# Patient Record
Sex: Female | Born: 1950 | Race: White | Hispanic: No | Marital: Married | Smoking: Current every day smoker
Health system: Southern US, Community
[De-identification: ages and names within clinical notes are randomized; demographics above are authoritative.]

## PROBLEM LIST (undated history)

## (undated) DIAGNOSIS — I10 Essential (primary) hypertension: Secondary | ICD-10-CM

## (undated) DIAGNOSIS — I639 Cerebral infarction, unspecified: Secondary | ICD-10-CM

## (undated) DIAGNOSIS — E079 Disorder of thyroid, unspecified: Secondary | ICD-10-CM

## (undated) DIAGNOSIS — J439 Emphysema, unspecified: Secondary | ICD-10-CM

## (undated) HISTORY — DX: Disorder of thyroid, unspecified: E07.9

## (undated) HISTORY — PX: SHOULDER SURGERY: SHX246

## (undated) HISTORY — DX: Essential (primary) hypertension: I10

## (undated) HISTORY — DX: Cerebral infarction, unspecified: I63.9

## (undated) HISTORY — DX: Emphysema, unspecified: J43.9

## (undated) HISTORY — PX: ABDOMINAL HYSTERECTOMY: SHX81

## (undated) HISTORY — PX: OTHER SURGICAL HISTORY: SHX169

## (undated) HISTORY — PX: BREAST EXCISIONAL BIOPSY: SUR124

---

## 1998-07-06 ENCOUNTER — Other Ambulatory Visit: Admission: RE | Admit: 1998-07-06 | Discharge: 1998-07-06 | Payer: Self-pay | Admitting: Obstetrics and Gynecology

## 1998-08-05 ENCOUNTER — Inpatient Hospital Stay (HOSPITAL_COMMUNITY): Admission: RE | Admit: 1998-08-05 | Discharge: 1998-08-06 | Payer: Self-pay | Admitting: Obstetrics and Gynecology

## 1998-08-10 ENCOUNTER — Encounter: Payer: Self-pay | Admitting: Obstetrics and Gynecology

## 1998-08-10 ENCOUNTER — Inpatient Hospital Stay (HOSPITAL_COMMUNITY): Admission: EM | Admit: 1998-08-10 | Discharge: 1998-08-18 | Payer: Self-pay | Admitting: Emergency Medicine

## 1998-08-11 ENCOUNTER — Encounter: Payer: Self-pay | Admitting: Obstetrics and Gynecology

## 1998-08-19 ENCOUNTER — Encounter (HOSPITAL_BASED_OUTPATIENT_CLINIC_OR_DEPARTMENT_OTHER): Payer: Self-pay | Admitting: General Surgery

## 1998-08-19 ENCOUNTER — Inpatient Hospital Stay (HOSPITAL_COMMUNITY): Admission: EM | Admit: 1998-08-19 | Discharge: 1998-08-30 | Payer: Self-pay | Admitting: Emergency Medicine

## 1998-08-20 ENCOUNTER — Encounter (HOSPITAL_BASED_OUTPATIENT_CLINIC_OR_DEPARTMENT_OTHER): Payer: Self-pay | Admitting: General Surgery

## 1998-08-21 ENCOUNTER — Encounter (HOSPITAL_BASED_OUTPATIENT_CLINIC_OR_DEPARTMENT_OTHER): Payer: Self-pay | Admitting: General Surgery

## 1998-08-23 ENCOUNTER — Encounter (HOSPITAL_BASED_OUTPATIENT_CLINIC_OR_DEPARTMENT_OTHER): Payer: Self-pay | Admitting: General Surgery

## 1998-08-25 ENCOUNTER — Encounter (HOSPITAL_BASED_OUTPATIENT_CLINIC_OR_DEPARTMENT_OTHER): Payer: Self-pay | Admitting: General Surgery

## 1998-08-26 ENCOUNTER — Encounter (HOSPITAL_BASED_OUTPATIENT_CLINIC_OR_DEPARTMENT_OTHER): Payer: Self-pay | Admitting: General Surgery

## 1998-08-30 ENCOUNTER — Encounter (HOSPITAL_BASED_OUTPATIENT_CLINIC_OR_DEPARTMENT_OTHER): Payer: Self-pay | Admitting: General Surgery

## 1999-10-07 ENCOUNTER — Ambulatory Visit (HOSPITAL_BASED_OUTPATIENT_CLINIC_OR_DEPARTMENT_OTHER): Admission: RE | Admit: 1999-10-07 | Discharge: 1999-10-08 | Payer: Self-pay | Admitting: Orthopedic Surgery

## 2002-08-12 ENCOUNTER — Encounter: Admission: RE | Admit: 2002-08-12 | Discharge: 2002-08-12 | Payer: Self-pay | Admitting: Family Medicine

## 2002-08-12 ENCOUNTER — Encounter: Payer: Self-pay | Admitting: Family Medicine

## 2002-09-25 ENCOUNTER — Encounter (INDEPENDENT_AMBULATORY_CARE_PROVIDER_SITE_OTHER): Payer: Self-pay | Admitting: Specialist

## 2002-09-25 ENCOUNTER — Ambulatory Visit (HOSPITAL_COMMUNITY): Admission: RE | Admit: 2002-09-25 | Discharge: 2002-09-25 | Payer: Self-pay | Admitting: Otolaryngology

## 2002-11-03 ENCOUNTER — Ambulatory Visit: Admission: RE | Admit: 2002-11-03 | Discharge: 2003-02-01 | Payer: Self-pay | Admitting: Radiation Oncology

## 2002-11-05 ENCOUNTER — Encounter: Admission: RE | Admit: 2002-11-05 | Discharge: 2002-11-05 | Payer: Self-pay | Admitting: Dentistry

## 2003-02-16 ENCOUNTER — Encounter: Payer: Self-pay | Admitting: Hematology & Oncology

## 2003-02-16 ENCOUNTER — Ambulatory Visit (HOSPITAL_COMMUNITY): Admission: RE | Admit: 2003-02-16 | Discharge: 2003-02-16 | Payer: Self-pay | Admitting: Hematology & Oncology

## 2003-02-27 ENCOUNTER — Other Ambulatory Visit: Admission: RE | Admit: 2003-02-27 | Discharge: 2003-02-27 | Payer: Self-pay | Admitting: Obstetrics and Gynecology

## 2003-03-09 ENCOUNTER — Ambulatory Visit: Admission: RE | Admit: 2003-03-09 | Discharge: 2003-03-09 | Payer: Self-pay | Admitting: Radiation Oncology

## 2003-04-02 ENCOUNTER — Ambulatory Visit: Admission: RE | Admit: 2003-04-02 | Discharge: 2003-04-02 | Payer: Self-pay | Admitting: Radiation Oncology

## 2003-05-11 ENCOUNTER — Ambulatory Visit (HOSPITAL_COMMUNITY): Admission: RE | Admit: 2003-05-11 | Discharge: 2003-05-11 | Payer: Self-pay | Admitting: Hematology & Oncology

## 2003-06-04 ENCOUNTER — Ambulatory Visit: Admission: RE | Admit: 2003-06-04 | Discharge: 2003-06-04 | Payer: Self-pay | Admitting: Radiation Oncology

## 2003-07-16 ENCOUNTER — Ambulatory Visit: Admission: RE | Admit: 2003-07-16 | Discharge: 2003-07-16 | Payer: Self-pay | Admitting: Radiation Oncology

## 2003-08-05 ENCOUNTER — Ambulatory Visit (HOSPITAL_COMMUNITY): Admission: RE | Admit: 2003-08-05 | Discharge: 2003-08-05 | Payer: Self-pay | Admitting: Hematology & Oncology

## 2003-09-10 ENCOUNTER — Ambulatory Visit: Admission: RE | Admit: 2003-09-10 | Discharge: 2003-09-10 | Payer: Self-pay | Admitting: Radiation Oncology

## 2003-10-19 ENCOUNTER — Ambulatory Visit (HOSPITAL_COMMUNITY): Admission: RE | Admit: 2003-10-19 | Discharge: 2003-10-19 | Payer: Self-pay | Admitting: Hematology & Oncology

## 2003-12-31 ENCOUNTER — Ambulatory Visit: Admission: RE | Admit: 2003-12-31 | Discharge: 2003-12-31 | Payer: Self-pay | Admitting: Radiation Oncology

## 2004-01-14 ENCOUNTER — Ambulatory Visit: Admission: RE | Admit: 2004-01-14 | Discharge: 2004-01-14 | Payer: Self-pay | Admitting: Radiation Oncology

## 2004-01-14 ENCOUNTER — Ambulatory Visit (HOSPITAL_COMMUNITY): Admission: RE | Admit: 2004-01-14 | Discharge: 2004-01-14 | Payer: Self-pay | Admitting: Radiation Oncology

## 2004-01-25 ENCOUNTER — Ambulatory Visit (HOSPITAL_COMMUNITY): Admission: RE | Admit: 2004-01-25 | Discharge: 2004-01-25 | Payer: Self-pay | Admitting: Hematology & Oncology

## 2004-02-03 ENCOUNTER — Ambulatory Visit: Payer: Self-pay | Admitting: Dentistry

## 2004-02-10 ENCOUNTER — Ambulatory Visit: Payer: Self-pay | Admitting: Dentistry

## 2004-02-11 ENCOUNTER — Ambulatory Visit: Admission: RE | Admit: 2004-02-11 | Discharge: 2004-02-11 | Payer: Self-pay | Admitting: Radiation Oncology

## 2004-02-17 ENCOUNTER — Ambulatory Visit: Payer: Self-pay | Admitting: Dentistry

## 2004-03-30 ENCOUNTER — Ambulatory Visit: Payer: Self-pay | Admitting: Dentistry

## 2004-04-08 ENCOUNTER — Ambulatory Visit (HOSPITAL_COMMUNITY): Admission: RE | Admit: 2004-04-08 | Discharge: 2004-04-08 | Payer: Self-pay | Admitting: Hematology & Oncology

## 2004-05-19 ENCOUNTER — Ambulatory Visit: Admission: RE | Admit: 2004-05-19 | Discharge: 2004-05-19 | Payer: Self-pay | Admitting: Radiation Oncology

## 2004-06-02 ENCOUNTER — Ambulatory Visit: Payer: Self-pay | Admitting: Hematology & Oncology

## 2004-07-27 ENCOUNTER — Ambulatory Visit: Payer: Self-pay | Admitting: Dentistry

## 2004-09-19 ENCOUNTER — Ambulatory Visit: Payer: Self-pay | Admitting: Hematology & Oncology

## 2004-09-20 ENCOUNTER — Ambulatory Visit: Payer: Self-pay | Admitting: Dentistry

## 2004-09-22 ENCOUNTER — Ambulatory Visit (HOSPITAL_COMMUNITY): Admission: RE | Admit: 2004-09-22 | Discharge: 2004-09-22 | Payer: Self-pay | Admitting: Hematology & Oncology

## 2005-03-30 ENCOUNTER — Ambulatory Visit: Payer: Self-pay | Admitting: Hematology & Oncology

## 2005-08-05 ENCOUNTER — Ambulatory Visit: Payer: Self-pay | Admitting: *Deleted

## 2005-08-05 ENCOUNTER — Inpatient Hospital Stay (HOSPITAL_COMMUNITY): Admission: EM | Admit: 2005-08-05 | Discharge: 2005-08-08 | Payer: Self-pay | Admitting: Emergency Medicine

## 2005-09-13 ENCOUNTER — Encounter: Payer: Self-pay | Admitting: Emergency Medicine

## 2005-09-13 ENCOUNTER — Encounter (HOSPITAL_BASED_OUTPATIENT_CLINIC_OR_DEPARTMENT_OTHER): Payer: Self-pay | Admitting: General Surgery

## 2006-03-28 ENCOUNTER — Ambulatory Visit: Payer: Self-pay | Admitting: Hematology & Oncology

## 2006-03-30 LAB — CBC WITH DIFFERENTIAL/PLATELET
BASO%: 0.4 % (ref 0.0–2.0)
Basophils Absolute: 0 10*3/uL (ref 0.0–0.1)
EOS%: 3 % (ref 0.0–7.0)
Eosinophils Absolute: 0.2 10*3/uL (ref 0.0–0.5)
HCT: 42.8 % (ref 34.8–46.6)
HGB: 14.9 g/dL (ref 11.6–15.9)
LYMPH%: 22.7 % (ref 14.0–48.0)
MCH: 32.5 pg (ref 26.0–34.0)
MCHC: 34.8 g/dL (ref 32.0–36.0)
MCV: 93.4 fL (ref 81.0–101.0)
MONO#: 0.4 10*3/uL (ref 0.1–0.9)
MONO%: 7.2 % (ref 0.0–13.0)
NEUT#: 4.1 10*3/uL (ref 1.5–6.5)
NEUT%: 66.7 % (ref 39.6–76.8)
Platelets: 212 10*3/uL (ref 145–400)
RBC: 4.58 10*6/uL (ref 3.70–5.32)
RDW: 12.8 % (ref 11.3–14.5)
WBC: 6.1 10*3/uL (ref 3.9–10.0)
lymph#: 1.4 10*3/uL (ref 0.9–3.3)

## 2006-03-30 LAB — COMPREHENSIVE METABOLIC PANEL
ALT: 13 U/L (ref 0–35)
AST: 20 U/L (ref 0–37)
Albumin: 4.1 g/dL (ref 3.5–5.2)
Alkaline Phosphatase: 70 U/L (ref 39–117)
BUN: 16 mg/dL (ref 6–23)
CO2: 30 mEq/L (ref 19–32)
Calcium: 9.7 mg/dL (ref 8.4–10.5)
Chloride: 106 mEq/L (ref 96–112)
Creatinine, Ser: 1.08 mg/dL (ref 0.40–1.20)
Glucose, Bld: 106 mg/dL — ABNORMAL HIGH (ref 70–99)
Potassium: 3.5 mEq/L (ref 3.5–5.3)
Sodium: 142 mEq/L (ref 135–145)
Total Bilirubin: 0.2 mg/dL — ABNORMAL LOW (ref 0.3–1.2)
Total Protein: 6.8 g/dL (ref 6.0–8.3)

## 2006-03-30 LAB — TSH: TSH: 2.946 u[IU]/mL (ref 0.350–5.500)

## 2007-03-27 ENCOUNTER — Ambulatory Visit: Payer: Self-pay | Admitting: Hematology & Oncology

## 2007-03-29 LAB — TSH: TSH: 4.718 u[IU]/mL (ref 0.350–5.500)

## 2007-03-29 LAB — CBC WITH DIFFERENTIAL/PLATELET
Basophils Absolute: 0 10*3/uL (ref 0.0–0.1)
Eosinophils Absolute: 0.2 10*3/uL (ref 0.0–0.5)
HCT: 42.5 % (ref 34.8–46.6)
HGB: 15 g/dL (ref 11.6–15.9)
MCV: 91.9 fL (ref 81.0–101.0)
NEUT#: 3.9 10*3/uL (ref 1.5–6.5)
NEUT%: 67.3 % (ref 39.6–76.8)
RDW: 13.3 % (ref 11.3–14.5)
lymph#: 1.3 10*3/uL (ref 0.9–3.3)

## 2007-03-29 LAB — COMPREHENSIVE METABOLIC PANEL
Albumin: 4.3 g/dL (ref 3.5–5.2)
BUN: 18 mg/dL (ref 6–23)
Calcium: 9.4 mg/dL (ref 8.4–10.5)
Chloride: 105 mEq/L (ref 96–112)
Glucose, Bld: 102 mg/dL — ABNORMAL HIGH (ref 70–99)
Potassium: 3.9 mEq/L (ref 3.5–5.3)

## 2007-12-09 ENCOUNTER — Ambulatory Visit (HOSPITAL_COMMUNITY): Admission: RE | Admit: 2007-12-09 | Discharge: 2007-12-09 | Payer: Self-pay | Admitting: Family Medicine

## 2008-03-25 ENCOUNTER — Ambulatory Visit: Payer: Self-pay | Admitting: Hematology & Oncology

## 2008-08-12 ENCOUNTER — Ambulatory Visit: Payer: Self-pay | Admitting: Hematology & Oncology

## 2008-08-13 LAB — COMPREHENSIVE METABOLIC PANEL
ALT: 13 U/L (ref 0–35)
AST: 21 U/L (ref 0–37)
Albumin: 4.2 g/dL (ref 3.5–5.2)
Alkaline Phosphatase: 70 U/L (ref 39–117)
BUN: 15 mg/dL (ref 6–23)
CO2: 26 mEq/L (ref 19–32)
Calcium: 9.4 mg/dL (ref 8.4–10.5)
Chloride: 103 mEq/L (ref 96–112)
Creatinine, Ser: 0.91 mg/dL (ref 0.40–1.20)
Glucose, Bld: 85 mg/dL (ref 70–99)
Potassium: 4.2 mEq/L (ref 3.5–5.3)
Sodium: 138 mEq/L (ref 135–145)
Total Bilirubin: 0.3 mg/dL (ref 0.3–1.2)
Total Protein: 6.7 g/dL (ref 6.0–8.3)

## 2008-08-13 LAB — CBC WITH DIFFERENTIAL (CANCER CENTER ONLY)
Eosinophils Absolute: 0.2 10*3/uL (ref 0.0–0.5)
LYMPH#: 1.3 10*3/uL (ref 0.9–3.3)
MONO#: 0.3 10*3/uL (ref 0.1–0.9)
MONO%: 6.2 % (ref 0.0–13.0)
NEUT#: 3.1 10*3/uL (ref 1.5–6.5)
Platelets: 195 10*3/uL (ref 145–400)
RBC: 4.69 10*6/uL (ref 3.70–5.32)
WBC: 5.1 10*3/uL (ref 3.9–10.0)

## 2008-08-13 LAB — TSH: TSH: 5.5 u[IU]/mL — ABNORMAL HIGH (ref 0.350–4.500)

## 2010-03-01 ENCOUNTER — Other Ambulatory Visit: Payer: Self-pay | Admitting: Surgery

## 2010-09-07 ENCOUNTER — Other Ambulatory Visit: Payer: Self-pay | Admitting: Dermatology

## 2010-09-30 NOTE — H&P (Signed)
Melody White, GANIM                 ACCOUNT NO.:  [id number]   MEDICAL RECORD NO.:  [id number]          PATIENT TYPE:  INP   LOCATION:  A211                          FACILITY:  [facility]   PHYSICIAN:  Corrie Mckusick, M.D.  DATE OF BIRTH:  Sep 24, 1950   DATE OF ADMISSION:  08/05/2005  DATE OF DISCHARGE:  LH                                HISTORY & PHYSICAL   ADMISSION DIAGNOSIS:  Transient ischemic attack.   HISTORY OF PRESENT ILLNESS:  This is a 60 year old smoker with no  significant past medical history who presents with TIA.  On the day of  admission she presents with right upper arm weakness and numbness and speech  difficulty which lasted about 90 minutes.  It came on while she was at home  and she started off with some speech difficulties and then had right upper  extremity weakness and tingling.  She came into the emergency department. By  the time she got there, she had had resolution of symptoms.  Really by the  time the ER physician evaluated her, she had completely returned to normal.  Her blood pressure was quite stable during the emergency room visit and  vital signs remained stable as well.   She has had a long smoking history but really no other significant past  medical history other than in 2004, she had excision of a right neck mass  which she reports was benign. Review of systems from the cardiovascular and  respiratory standpoint were otherwise negative.   PAST MEDICAL HISTORY:  1.  2001, rotator cuff surgery, left shoulder.  2.  Right neck mass biopsy and subsequent removal.  3.  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   MEDICATIONS ON ADMISSION:  None.   ALLERGIES:  None reported.   SOCIAL HISTORY:  Smoked most of her life. No alcohol use.   FAMILY HISTORY:  Noncontributory.  There is no history of CVAs per the  patient.   PHYSICAL EXAMINATION:  VITAL SIGNS: Temperature 98.6 , blood pressure  140/78, 20, heart rate 60, weight 125 pounds.  GENERAL:  When I saw her, she was pleasant, talkative female, joking, ready,  and wanting to go home.  HEENT:  Nasopharynx clear.  NECK:  Supple. No lymphadenopathy, no thyromegaly.  Carotids: Actually I do  think there is a left bruit on the left side that is difficult to hear, but  I think it is quite faint.  Right is without bruits.  CHEST:  Clear to auscultation bilaterally.  CARDIOVASCULAR:  Regular rate and rhythm.  Normal S1 and S2.  No S3 or S4,  murmurs, gallops or rubs.  ABDOMEN:  Soft, nontender, nondistended.  EXTREMITIES:  No cyanosis, clubbing or edema.  NEUROLOGIC:  Pupils are equal and react to light.  Extraocular muscles  intact.  Cranial nerves II-XII intact.  Strength is 5/5 throughout.  Sensation is intact throughout.  Completely nonfocal neurological exam.   LABORATORY DATA:  Please see flow sheet for details.  CT scan of the head is  reportedly negative per the emergency room physician.  ASSESSMENT/PLAN:  A 60 year old female with longstanding smoking history who  presents with transient ischemic attack.   PLAN:  1.  Admit to 2A for close monitoring and telemetry.  2.  Go ahead and cover with full dose Lovenox 1 mg/kg q.12h.  3.  Aspirin 325 mg coated daily.  4.  Carotid Dopplers.  5.  Echocardiogram.  6.  MRI/MRA.  7.  Neurology consult.      Corrie Mckusick, M.D.  Electronically Signed     JCG/MEDQ  D:  08/06/2005  T:  08/08/2005  Job:  [id number]

## 2010-09-30 NOTE — Op Note (Signed)
NAME:  Melody White, Melody White                           ACCOUNT NO.:  [id number]   MEDICAL RECORD NO.:  [id number]                   PATIENT TYPE:  OIB   LOCATION:  NA                                   FACILITY:  [facility]   PHYSICIAN:  Kinnie Scales. Annalee Genta, M.D.            DATE OF BIRTH:  1950/08/15   DATE OF PROCEDURE:  09/25/2002  DATE OF DISCHARGE:                                 OPERATIVE REPORT   PREOPERATIVE DIAGNOSIS:  Right superior neck mass.   POSTOPERATIVE DIAGNOSIS:  Right superior neck mass.   OPERATION PERFORMED:  1. Direct laryngoscopy and cervical esophagoscopy with biopsy of the right     base of tongue.  2. Excisional biopsy, right deep superior neck mass.   SURGEON:  Kinnie Scales. Annalee Genta, M.D.   ANESTHESIA:  General endotracheal.   COMPLICATIONS:  None.   ESTIMATED BLOOD LOSS:  Minimal.   DISPOSITION:  Patient transferred from the operating room to the recovery  room in stable condition.   INDICATIONS FOR PROCEDURE:  The patient is a 60 year old white female who  was referred for evaluation of a gradually enlarging mass in the right  superior neck.  The patient had a one pack per day smoking history, is  otherwise healthy and had no associated problems or complaints, no pain,  erythema or evidence of infection.  She had been treated with several  courses of antibiotics without improvement.  Examination in the office  revealed an approximately 2 cm firm mobile mass in the superior aspect of  the right neck anterior and deep to the sternocleidomastoid muscle.  A CT  scan was obtained which showed a well circumscribed mass in the anterior  superior neck consistent with lymph node.  There were several other small,  nonpathologically sized lymph nodes in the same region.  Given the patient's  history and examination, I recommended that we undertake direct laryngoscopy  and cervical esophagoscopy for evaluation of the entire upper aerodigestive  tract and incision biopsy of  the right deep neck mass.  The risks, benefits,  and possible complications of each of the surgical procedures was discussed  in detail with the patient and her family who  understood and concurred with  our plan for surgery which was scheduled for Sep 25, 2002.   DESCRIPTION OF PROCEDURE:  The patient was brought to the operating room on  Sep 25, 2002.  She was placed in supine position on the operating table and  general endotracheal anesthesia was established without difficulty.  When  the patient was adequately anesthetized, direct laryngoscopy and cervical  esophagoscopy was undertaken.  The patient's oral cavity, oropharynx, larynx  and hypopharynx were thoroughly examined.  The patient had 1+ tonsils.  No  mucosal ulceration, mass or lesion.  The base of tongue appeared normal  The  supraglottis, vallecula, hypopharynx and larynx all appeared normal without  evidence of mass or tumor.  Several blind biopsies were taken of the right  inferior tonsil/base of tongue and these were sent to pathology for  microscopic evaluation.  The remainder of the patient's upper aerodigestive  tract appeared normal without mucosal lesion or ulceration.  A 30 cm  cervical esophagoscope was then inserted into the esophageal introitus  without difficulty and then inserted full length to 30 cm and then gradually  withdrawn.  There was no evidence of mucosal mass or ulceration within the  patient's upper esophagus.  With conclusion of this portion of the  procedure, excisional biopsy of the mass was undertaken.   The patient's neck was injected with 2mL of 1% lidocaine 1:100,000 dilution  of epinephrine in a subcutaneous fashion in the skin overlying the mass.  The patient's neck was then prepped and draped in sterile fashion.  The  surgical procedure was begun by creating a 3 cm horizontally oriented  incision in a pre-existing skin crease.  This was carried through the  cutaneous layer and underlying  deep subcutaneous fat to the level of the  platysma muscle.  The platysma muscle was divided and elevated.  Subplatysmal dissection was carried out above and below the level of the  right neck mass.  The medial  aspect of the sternocleidomastoid muscle was  identified and retracted laterally and this allowed direct access to the  deep compartment of the neck.  A well-circumscribed approximately 1 x 2 cm  firm mass was then isolated from the surrounding tissues using Bovie  electrocautery and bipolar cautery as well as suture ligature.  Small blood  vessels were divided and ligated and the mass was excised from the deep  compartment of the neck.  Jugular vein, spinal accessory nerve and carotid  artery were identified and preserved throughout their course.  With the mass  removed, it was sent to pathology for gross and microscopic evaluation as  well as frozen section evaluation.  The patient's neck was then thoroughly  irrigated with saline solution.  Several areas of point hemorrhage were  cauterized with bipolar cautery and the wound was closed in multiple layers  consisting of reapproximation of the platysma muscle with 4-0 Vicryl suture,  reapproximation of the deep subcutaneous tissue with 4-0 Vicryl in an  interrupted fashion and final skin closure consisting of 5-0 Ethilon suture  in a running locked fashion.  The patient's wound was then cleansed and  dressed with bacitracin ointment.  She was awakened from anesthetic.  She  was extubated and was transferred from the operating room to the recovery  room in stable condition.  There were no complications.  The estimated blood  loss was minimal.                                               Onalee Hua L. Annalee Genta, M.D.    DLS/MEDQ  D:  16/02/9603  T:  09/26/2002  Job:  [id number]

## 2010-09-30 NOTE — Procedures (Signed)
Melody White, Melody White NO.:  [id number]   MEDICAL RECORD NO.:  [id number]          PATIENT TYPE:  INP   LOCATION:  A211                          FACILITY:  [facility]   PHYSICIAN:  Vida Roller, M.D.   DATE OF BIRTH:  Jul 16, 1950   DATE OF PROCEDURE:  08/07/2005  DATE OF DISCHARGE:                                  ECHOCARDIOGRAM   TAPE NUMBER:  [id number].   TAPE COUNT:  1005 through 1517.   This is a 60 year old woman with a TIA.  Technical quality of this study is  adequate.   M-MODE TRACING:  The aorta is 29 mm.   Left atrium is 30 mm.   Septum is 9 mm.   Posterior wall is 9 mm   Left ventricular diastolic dimension 45 mm.   Left ventricular systolic dimension 32 mm.   2-D AND DOPPLER IMAGING:  The left ventricular is normal size with normal  systolic function.  Estimated ejection fraction 55-60%.  There are no wall  motion abnormalities seen.   The right ventricle is normal size with normal systolic function.   Both atria appear normal.   The aortic valve is without stenosis or regurgitation.   The mitral valve has trivial regurgitation.  No stenosis is seen.   The tricuspid valve is without significant stenosis or regurgitation.   There is no pericardial effusion.   The ascending aorta was not well seen.   ASSESSMENT:  1.  No evidence for source of emboli.  2.  This study is likely inadequate to completely exclude a source of      emboli.  If clinical correlation is appropriate, transesophageal      echocardiogram is recommended.      Vida Roller, M.D.  Electronically Signed     JH/MEDQ  D:  08/07/2005  T:  08/08/2005  Job:  [id number]   cc:   Corrie Mckusick, M.D.  Fax: [phone]

## 2010-09-30 NOTE — Consult Note (Signed)
NAMEHOANG, REICH                 ACCOUNT NO.:  [id number]   MEDICAL RECORD NO.:  [id number]          PATIENT TYPE:  INP   LOCATION:  A211                          FACILITY:  [facility]   PHYSICIAN:  Kofi A. Gerilyn Pilgrim, M.D. DATE OF BIRTH:  [birth year]   DATE OF CONSULTATION:  DATE OF DISCHARGE:                                   CONSULTATION   NEUROLOGICAL CONSULTATION   REASON FOR CONSULTATION:  Transient ischemic attack.   HISTORY OF PRESENT ILLNESS:  60 year old white female who developed the  acute onset of tunnel vision with grain and blackening of her vision  bilateral.  This was associated with numbness of the right upper extremity,  followed by similar symptoms involving the right leg.  She also had some  weakness on the right side. However, she developed similar symptoms  involving the left side.  The event lasted for several hours.  On coming to  the emergency room she had improvement of her visual problems with some  residual problems involving the extremities.  She denies diplopia,  dysarthria, or dysphagia.  The patient endorsed having headaches but  apparently this is a chronic thing. She apparently has frequent daily  headaches in bifrontal regions, not associated with any symptoms including  not associated with headache with nausea.  She does have less frequent  severe headaches associated with nausea which she described as migrainous.   PAST MEDICAL HISTORY:  Significant for some type of neck cancer where she  had excision.  The patient is also status post hysterectomy.   ADMISSION MEDICATIONS:  Over the counter [facility] Goody Powders for headaches.   REVIEW OF SYSTEMS:  Essentially negative apart from history of present  illness.   SOCIAL HISTORY:  She is married and smokes about one pack of cigarettes per  day. No alcohol or illicit drug use.   PHYSICAL EXAMINATION:  GENERAL APPEARANCE:  Examination shows an average-  weight lady in no acute distress.  VITAL SIGNS:   Temperature 97.6, pulse 67, respirations 20, blood pressure  123/76.  HEENT:  Evaluation shows neck is supple.  Head is normocephalic, atraumatic.  ABDOMEN:  Soft.  EXTREMITIES: No cyanosis, varicosities or edema.  MENTATION:  She is awake and alert.  She converses well.  She is lucid and  coherent. Speech, language and cognition are intact.  CRANIAL NERVES II-XII:  Evaluation shows pupils are 4 mm and briskly  reactive. Extraocular movements intact. Visual fields are full.  Facial  muscle strength is normal and symmetric.  Tongue is midline. Uvula is  midline.  Shoulder shrugs normal.  Motor examination shows normal tone, bulk  and strength.  There is no drift.  Coordination shows no evidence of  dysmetria.  There is no tremor noted, bradykinesia. Reflexes are normal and  symmetric.  Plantar reflexes are both downgoing.  Gait is normal.   CLINICAL DATA:  Head CT scan is negative.   LABORATORY DATA:  White blood cell count 7.0, hemoglobin 15, platelet count  200,000.  Sodium 139, potassium 3.7, chloride 106, cO2 27, glucose 95, BUN  13, creatinine 1.1,  calcium 9.8.  CPK 98. Alcohol less than 5.   ASSESSMENT:  Ill-defined symptomatology, does not seem to localize clearly  to any defined anatomical location.  Even the bilateral symptoms one wonders  about vertebrobasilar disease, although I think this is unlikely given her  relative lack of risk factors.  One wonders about the functional  problem  versus an anatomical problem.   RECOMMENDATIONS:  Agree to MRI and MRA.  I am going to add contrast to the  MRI of the brain.  Additional evaluation should include carotid Doppler's,  vitamin B12 level and thyroid function testing.      Kofi A. Gerilyn Pilgrim, M.D.  Electronically Signed     KAD/MEDQ  D:  08/07/2005  T:  08/07/2005  Job:  [id number]

## 2010-09-30 NOTE — Op Note (Signed)
[facility]  Patient:    Melody White, Melody White                   MRN: [id number] Proc. Date: 10/07/99 Adm. Date:  [id number] Disc. 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  POSTOPERATIVE DIAGNOSES: 1. Full thickness rotator cuff tear. 2. Type 3 acromion with impingement. 3. Anterior labral tear.  PRINCIPAL PROCEDURE: 1. Mini open rotator cuff repair with three suture anchors. 2. Arthroscopic acromioplasty with debridement of full thickness rotator cuff    and anterior labral tear.  SURGEON:  Harvie Junior, M.D.  ASSISTANT:  Kerby Less, P.A.  ANESTHESIA:  General with block.  BRIEF HISTORY:  The patient is a 60 year old female with a long history of having had left shoulder pain.  She did not improve with injections completely, but did have complete 100% relief in the office with an injection. She failed conservative care and ultimately is brought to the operating room for debridement of type 3 acromion.  Thoughts have been given preoperative to doing a capsular shrinkage because she did have some quite significant multi-directional instability.  She was brought to the operating room for this evaluation under anesthesia and arthroscopy.  DESCRIPTION OF PROCEDURE:  The patient was brought to the operating room and after adequate anesthesia was obtained with a general anesthetic, the patient was placed supine on the operating table.  She was then moved into the beach chair position.  From the beach chair position, she was examined under anesthesia and noted to have obvious anterior laxity and inferior laxity, essentially more anterior than anything else.  The head did not really dislocate with extreme external rotation, but did kind of move anteriorly. The significance of this is unclear.  She did not  frankly dislocate coming anteriorly.  At this point, she was prepped and draped in the usual sterile fashion, and routine arthroscopy revealed that there was some inferior and anterior glenohumeral laxity.  There was no significant glenohumeral arthritis.  There was an obvious full thickness rotator cuff tear in the area of the supraspinatus with quite significant fray posterior to the full thickness tear.  At this point, attention was turned into the subacromial space.  Thoughts about doing a capsular shrinkage were put on hold, given that we were going to have to do a mini open full thickness rotator cuff repair, and the postoperative recovery would be significantly different with the rotator cuff repair than with the capsular shrinkage, and given that she had a full thickness tear, and the major complaint was pain, it is felt that addressing this would most likely address the predominant issue of pain, and the thoughts were given that we could come back and do a shrinkage if we needed to, but this was the thought process that we undertook at the time of the case.  At this point, a motorized bur was introduced in the subacromial space and an anterolateral acromioplasty was performed.  Following this, attention was turned to the full thickness rotator cuff tear and the scope was removed after the edges were debrided and the full thickness area identified.  At that point, a mini open incision was made.  The subcutaneous tissue was taken down to the level of  the deltoid which was identified and divided in line with its fibers, in line with the portal.  We dissected at that point up on top of the acromion and the flaps were raised of the deltoid with care being taken to maintain integrity of the deltoid to bone.  At this point, the shoulder was opened.  The acromioplasty was evaluated and felt to be perfectly done.  At that point, attention was turned to the bursa where a full  bursectomy was performed.  The full thickness defect could be easily identified at this point.  There was delamination underneath and the cuff was elevated anterior and posterior to this full thickness area to allow access to this high grade partial thickness tearing.  At this point, three suture anchors were used to hold the cuff in place, and then a side-to-side repair was made through the posterior aspect of the rotator cuff.  At this point, the wound was copiously irrigated and suctioned dry.  All the repair was done with suture anchors of #2 Ethibond.  The deltoid was then closed with #1 Vicryl running, the subcutaneous with 2-0 Vicryl running, and the skin with 3-0 Prolene pull out.  Steri-Strips were applied to the front portals and the side wound, and the patient was then placed into a compressive dressing and shoulder immobilizer. She was taken to the recovery room where she was noted to be in satisfactory condition.  Estimated blood loss for this procedure was none. DD:  10/07/99 TD:  10/11/99 Job: [id number]

## 2010-09-30 NOTE — Discharge Summary (Signed)
NAMENICCI, VAUGHAN                 ACCOUNT NO.:  [id number]   MEDICAL RECORD NO.:  [id number]          PATIENT TYPE:  INP   LOCATION:  A211                          FACILITY:  [facility]   PHYSICIAN:  Corrie Mckusick, M.D.  DATE OF BIRTH:  07-23-1950   DATE OF ADMISSION:  08/05/2005  DATE OF DISCHARGE:  03/27/2007LH                                 DISCHARGE SUMMARY   DISCHARGE DIAGNOSIS:  Transient ischemic attack.   HISTORY OF PRESENTING ILLNESS AND PAST MEDICAL HISTORY:  Please see  admission H&P.   HOSPITAL COURSE:  A 60 year old female with longstanding smoking history who  presents with TIA.  She was admitted to 2A for close monitoring and had no  further symptoms whatsoever.  Neurologically completely intact.  She was  started on aspirin 325 daily and covered with Lovenox during the hospital  stay.   Neurology was consulted and saw the patient and agreed with the use of  aspirin.  MRI/MRA was overall normal per Dr. Gerilyn Pilgrim.  It will be read by  the radiologist as well.  Echocardiogram is still pending at this time and  we will check this prior to her discharge.  Her bilateral carotid Dopplers  were essentially normal as well.  All blood work came back normal.   The patient was ready for discharge on August 08, 2005 and felt again  remarkably well and back to normal.   DISCHARGE PHYSICAL:  VITAL SIGNS:  Temperature 97.6, blood pressure  120s/70s, heart rate 60s, respirations 20.  GENERAL:  Pleasant female in no acute distress.  RESPIRATORY:  Chest clear to auscultation bilaterally.  CARDIOVASCULAR:  Regular rate and rhythm.  Normal S1 and S2.  No murmurs,  gallops, or rubs.  ABDOMEN:  Soft and nontender.  NEUROLOGICAL:  Cranial nerves II-XII are intact.  Strength is 5/5  throughout.  Sensation is intact.  It is completely nonfocal exam.   DISCHARGE MEDICATIONS:  1.  Aspirin 325 daily.  2.  Chantix as prescribed for smoking cessation.  3.  She is going to also set up followup  with Dr. Gerilyn Pilgrim in 1-2 weeks      after discharge and she is going to follow up with me in the office in 1-      2 weeks after discharge as well.   DISCHARGE CONDITION:  Improved and stable.      Corrie Mckusick, M.D.  Electronically Signed     JCG/MEDQ  D:  08/08/2005  T:  08/09/2005  Job:  [id number]

## 2010-12-21 ENCOUNTER — Other Ambulatory Visit: Payer: Self-pay | Admitting: Obstetrics and Gynecology

## 2012-06-17 ENCOUNTER — Other Ambulatory Visit: Payer: Self-pay | Admitting: Surgery

## 2013-01-06 ENCOUNTER — Other Ambulatory Visit: Payer: Self-pay | Admitting: Physician Assistant

## 2013-03-21 ENCOUNTER — Other Ambulatory Visit: Payer: Self-pay | Admitting: Obstetrics and Gynecology

## 2014-03-27 ENCOUNTER — Other Ambulatory Visit: Payer: Self-pay | Admitting: Obstetrics and Gynecology

## 2015-11-09 ENCOUNTER — Other Ambulatory Visit: Payer: Self-pay | Admitting: Family Medicine

## 2015-11-09 DIAGNOSIS — R590 Localized enlarged lymph nodes: Secondary | ICD-10-CM

## 2015-11-15 ENCOUNTER — Ambulatory Visit
Admission: RE | Admit: 2015-11-15 | Discharge: 2015-11-15 | Disposition: A | Discharge status: Home / Self Care | Payer: BLUE CROSS/BLUE SHIELD | Source: Ambulatory Visit | Attending: Family Medicine | Admitting: Family Medicine

## 2015-11-15 DIAGNOSIS — R590 Localized enlarged lymph nodes: Secondary | ICD-10-CM

## 2015-12-03 ENCOUNTER — Ambulatory Visit (HOSPITAL_COMMUNITY)
Admission: RE | Admit: 2015-12-03 | Discharge: 2015-12-03 | Disposition: A | Discharge status: Home / Self Care | Payer: BLUE CROSS/BLUE SHIELD | Source: Ambulatory Visit | Attending: Vascular Surgery | Admitting: Vascular Surgery

## 2015-12-03 ENCOUNTER — Other Ambulatory Visit (HOSPITAL_COMMUNITY): Payer: Self-pay | Admitting: Family Medicine

## 2015-12-03 DIAGNOSIS — I6523 Occlusion and stenosis of bilateral carotid arteries: Secondary | ICD-10-CM | POA: Diagnosis not present

## 2015-12-03 DIAGNOSIS — I6529 Occlusion and stenosis of unspecified carotid artery: Secondary | ICD-10-CM | POA: Diagnosis not present

## 2015-12-03 LAB — VAS US CAROTID
LEFT ECA DIAS: -14 cm/s
LICADDIAS: -23 cm/s
LICAPDIAS: -28 cm/s
Left CCA dist dias: 28 cm/s
Left CCA dist sys: 94 cm/s
Left CCA prox dias: 41 cm/s
Left CCA prox sys: 101 cm/s
Left ICA dist sys: -75 cm/s
Left ICA prox sys: -80 cm/s
RIGHT CCA MID DIAS: 28 cm/s
RIGHT ECA DIAS: 11 cm/s
Right CCA prox dias: 29 cm/s
Right CCA prox sys: 104 cm/s
Right cca dist sys: -96 cm/s

## 2016-11-06 ENCOUNTER — Other Ambulatory Visit: Payer: Self-pay | Admitting: Family Medicine

## 2016-11-06 DIAGNOSIS — Z1231 Encounter for screening mammogram for malignant neoplasm of breast: Secondary | ICD-10-CM

## 2016-11-28 ENCOUNTER — Ambulatory Visit
Admission: RE | Admit: 2016-11-28 | Discharge: 2016-11-28 | Disposition: A | Discharge status: Home / Self Care | Payer: BLUE CROSS/BLUE SHIELD | Source: Ambulatory Visit | Attending: Family Medicine | Admitting: Family Medicine

## 2016-11-28 ENCOUNTER — Ambulatory Visit: Payer: BLUE CROSS/BLUE SHIELD

## 2016-11-28 DIAGNOSIS — Z1231 Encounter for screening mammogram for malignant neoplasm of breast: Secondary | ICD-10-CM

## 2017-12-04 ENCOUNTER — Other Ambulatory Visit: Payer: Self-pay | Admitting: Family Medicine

## 2017-12-04 DIAGNOSIS — Z1231 Encounter for screening mammogram for malignant neoplasm of breast: Secondary | ICD-10-CM

## 2017-12-24 ENCOUNTER — Ambulatory Visit: Payer: BLUE CROSS/BLUE SHIELD

## 2018-02-14 ENCOUNTER — Ambulatory Visit: Payer: Self-pay

## 2019-01-06 ENCOUNTER — Other Ambulatory Visit: Payer: Self-pay | Admitting: Family Medicine

## 2019-01-06 DIAGNOSIS — Z1231 Encounter for screening mammogram for malignant neoplasm of breast: Secondary | ICD-10-CM

## 2019-01-10 ENCOUNTER — Other Ambulatory Visit: Payer: Self-pay | Admitting: Family Medicine

## 2019-01-10 ENCOUNTER — Ambulatory Visit
Admission: RE | Admit: 2019-01-10 | Discharge: 2019-01-10 | Disposition: A | Discharge status: Home / Self Care | Payer: Medicare Other | Source: Ambulatory Visit | Attending: Family Medicine | Admitting: Family Medicine

## 2019-01-10 DIAGNOSIS — F17200 Nicotine dependence, unspecified, uncomplicated: Secondary | ICD-10-CM

## 2019-01-10 DIAGNOSIS — R7981 Abnormal blood-gas level: Secondary | ICD-10-CM

## 2019-01-17 ENCOUNTER — Telehealth: Payer: Self-pay

## 2019-01-17 NOTE — Telephone Encounter (Signed)
NOTES ON FILE FROM [facility] [phone], SENT REFERRAL TO SCHEDULING °

## 2019-02-21 ENCOUNTER — Other Ambulatory Visit: Payer: Self-pay

## 2019-02-21 ENCOUNTER — Ambulatory Visit
Admission: RE | Admit: 2019-02-21 | Discharge: 2019-02-21 | Disposition: A | Discharge status: Home / Self Care | Payer: Medicare Other | Source: Ambulatory Visit | Attending: Family Medicine | Admitting: Family Medicine

## 2019-02-21 DIAGNOSIS — Z1231 Encounter for screening mammogram for malignant neoplasm of breast: Secondary | ICD-10-CM

## 2019-03-05 NOTE — Progress Notes (Signed)
Cardiology Office Note:   Date:  03/06/2019  NAME:  Melody White    MRN: [id number] DOB:  [birth year]   PCP:  Aletha Halim., PA-C  Cardiologist:  Evalina Field, MD  Electrophysiologist:  None   Referring MD: Aletha Halim., PA-C   Chief Complaint  Patient presents with  . Abnormal ECG   History of Present Illness:   Melody White is a 68 y.o. female with a hx of hypothyroidism, anxiety, hypertension, tobacco abuse who is being seen today for the evaluation of aortic calcifications at the request of Aletha Halim., PA-C.  She recently had an x-ray of her chest performed by her primary care physician and was noted to have aortic calcifications.  She was then sent for further evaluation.  [name] reports no prior history of myocardial infarction or heart failure.  She describes no symptoms of chest pain or trouble breathing.  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  She is also been told her heart rate is little low, but she has no episodes of lightheadedness or dizziness with activity.  She does report bruising as she is been on aspirin 325 mg since a TIA a number of years ago.  She also has a lower extremity wound over the shin of the left leg.  Pulses on that left leg are also diminished.  Pulses in the right leg are good 2+.  She reports no symptoms of claudication that I can tell but does have evidence of likely PAD on exam.  Her most recent lipid profile shows an LDL of 101 HDL 61.  EKG demonstrates an incomplete right bundle branch block with left axis deviation, and she is in sinus bradycardia without any evidence of acute ischemia or prior infarction.  She is a smoker currently smokes half pack and has done so for 50 years.  She is trying to quit.  There is a imaging diagnosis of COPD, however she is not on any inhalers.  Past Medical History: Past Medical History:  Diagnosis Date  . Hypertension   . Stroke  ([place])   . Thyroid disease     Past Surgical History: Past Surgical History:  Procedure Laterality Date  . ABDOMINAL HYSTERECTOMY    . bowel obstruction    . BREAST EXCISIONAL BIOPSY Right 20+ yrs ago   benign  . SHOULDER SURGERY      Current Medications: Current Meds  Medication Sig  . amitriptyline (ELAVIL) 25 MG tablet Take 1 tablet by mouth at bedtime.  . Ascorbic Acid (VITAMIN C) 1000 MG tablet Take 1,000 mg by mouth daily.  . diazepam (VALIUM) 10 MG tablet 1 tablet. Into the vagina every night as needed for pelvic floor spasms  . Echinacea 400 MG CAPS Take 400 mg by mouth daily.  Marland Kitchen escitalopram (LEXAPRO) 10 MG tablet Take 1 tablet by mouth daily.  . fluticasone (FLONASE) 50 MCG/ACT nasal spray Place 2 sprays into the nose as needed.  Marland Kitchen levocetirizine (XYZAL) 5 MG tablet Take 1 tablet by mouth as needed.  Marland Kitchen levothyroxine (SYNTHROID) 25 MCG tablet Take 1 tablet by mouth daily.  Marland Kitchen lisinopril (ZESTRIL) 10 MG tablet Take 1 tablet by mouth daily.  . Multiple Vitamin (MULTIVITAMIN) capsule Take 1 capsule by mouth daily.     Allergies:    Amoxicillin, Azithromycin, Clindamycin, Codeine, Fexofenadine, Hydrocodone-acetaminophen, Hydrocodone-homatropine, and Sulfamethoxazole   Social History: Social History   Socioeconomic History  .  Marital status: Married    Spouse name: Not on file  . Number of children: 0  . Years of education: Not on file  . Highest education level: Not on file  Occupational History  . Occupation: retired   Engineer, production  . Financial resource strain: Not on file  . Food insecurity    Worry: Not on file    Inability: Not on file  . Transportation needs    Medical: Not on file    Non-medical: Not on file  Tobacco Use  . Smoking status: Current Every Day Smoker    Packs/day: 0.50    Years: 50.00    Pack years: 25.00    Types: Cigarettes  . Smokeless tobacco: Never Used  Substance and Sexual Activity  . Alcohol use: Not Currently  . Drug use: Not  on file  . Sexual activity: Not on file  Lifestyle  . Physical activity    Days per week: Not on file    Minutes per session: Not on file  . Stress: Not on file  Relationships  . Social Musician on phone: Not on file    Gets together: Not on file    Attends religious service: Not on file    Active member of club or organization: Not on file    Attends meetings of clubs or organizations: Not on file    Relationship status: Not on file  Other Topics Concern  . Not on file  Social History Narrative  . Not on file     Family History: The patient's family history includes Arthritis in her mother.  ROS:   All other ROS reviewed and negative. Pertinent positives noted in the HPI.     EKGs/Labs/Other Studies Reviewed:   The following studies were personally reviewed by me today:  EKG:  EKG is ordered today.  The ekg ordered today demonstrates sinus bradycardia, heart rate 56, incomplete right bundle branch block with left axis deviation, no acute ischemic ST-T changes, no evidence of prior infarction, and was personally reviewed by me.   CXR 01/10/2019 FINDINGS: Lungs are somewhat hyperexpanded. There is scarring in the right base and to a lesser extent in each upper lobe. There is no edema or consolidation. Heart size and pulmonary vascularity are normal. No adenopathy. There is aortic atherosclerosis. There is mild degenerative change in the thoracic spine.  IMPRESSION: Lungs hyperexpanded with scattered areas of scarring. No edema or consolidation. Heart size normal. Aortic Atherosclerosis (ICD10-I70.0  Recent Labs: No results found for requested labs within last 8760 hours.   Recent Lipid Panel No results found for: CHOL, TRIG, HDL, CHOLHDL, VLDL, LDLCALC, LDLDIRECT  Physical Exam:   VS:  BP 108/70   Pulse (!) 56   Ht  (1.676 m)   Wt 116 lb 9.6 oz (52.9 kg)   BMI 18.82 kg/m    Wt Readings from Last 3 Encounters:  03/06/19 116 lb 9.6 oz (52.9 kg)     General: Well nourished, well developed, in no acute distress Heart: Atraumatic, normal size  Eyes: PEERLA, EOMI  Neck: Supple, no JVD Endocrine: No thryomegaly Cardiac: Normal S1, S2; RRR; no murmurs, rubs, or gallops Lungs: Clear to auscultation bilaterally, no wheezing, rhonchi or rales  Abd: Soft, nontender, no hepatomegaly  Ext: No edema, there is a nonhealing superficial wound on the left shin, pulses are diminished on the left lower extremities, 2+ pulses on the right Musculoskeletal: No deformities, BUE and BLE strength normal and  equal Skin: Warm and dry, no rashes   Neuro: Alert and oriented to person, place, time, and situation, CNII-XII grossly intact, no focal deficits  Psych: Normal mood and affect   ASSESSMENT:   Melody White is a 68 y.o. female who presents for the following: 1. Aortic calcification (HCC)   2. Bradycardia   3. Essential hypertension   4. Tobacco abuse   5. Wound of left lower extremity, initial encounter     PLAN:   1. Aortic calcification (HCC) -I suspect she just has calcifications due to a long history of smoking.  I reviewed the x-rays and the aortic knob on the plain film definitely has evidence of atherosclerosis of the aorta.  She has no symptoms to suggest he has underlying CAD.   -I think we will reduce her aspirin 81 mg and start her on Crestor 10 mg daily.  There is no long-term benefit of aspirin 325.  We will aim to get her cholesterol less than 70.  I will see her back in 3 months she will be fasting we will recheck her lipid profile then. -There is no need for cardiac stress test at this point  2. Bradycardia -Sinus bradycardia today with heart rate 56.  She describes no symptoms of chronotropic incompetence and is able to do exercise without limitations.  I see no need for a monitor at this point we will just watch this.  3. Essential hypertension -Blood pressure is well controlled on lisinopril continue this  4. Tobacco abuse  -50-pack-year history advised to quit smoking  5. Wound of left lower extremity, initial encounter -She has evidence of a poorly healing wound of the left lower extremity as well as diminished pulses in the left leg.  Given that she is had such an extensive smoking history I think we should proceed with a screening ABI as well as arterial duplexes. -No classic symptoms of claudication but I think we should proceed to evaluate this given the wound that is not healing well   Disposition: Return in about 3 months (around 06/06/2019).  Medication Adjustments/Labs and Tests Ordered: Current medicines are reviewed at length with the patient today.  Concerns regarding medicines are outlined above.  Orders Placed This Encounter  Procedures  . VAS [place] ABI WITH/WO TBI  . VAS [place] LOWER EXTREMITY ARTERIAL DUPLEX   Meds ordered this encounter  Medications  . aspirin EC 81 MG tablet    Sig: Take 1 tablet (81 mg total) by mouth daily.    Dispense:  90 tablet    Refill:  3  . rosuvastatin (CRESTOR) 10 MG tablet    Sig: Take 1 tablet (10 mg total) by mouth daily.    Dispense:  90 tablet    Refill:  3    Patient Instructions  Medication Instructions:  START ASPIRIN 81 MG ONCE DAILY  START ROSUVASTATIN 10 MG ONCE DAILY  *If you need a refill on your cardiac medications before your next appointment, please call your pharmacy*  Lab Work: If you have labs (blood work) drawn today and your tests are completely normal, you will receive your results only by: Marland Kitchen MyChart Message (if you have MyChart) OR . A paper copy in the mail If you have any lab test that is abnormal or we need to change your treatment, we will call you to review the results.  Testing/Procedures: Your physician has requested that you have a lower extremity arterial duplex. During this test, ultrasound are used to  evaluate arterial blood flow in the legs. Allow one hour for this exam. There are no restrictions or special  instructions. [facility]  Follow-Up: At [facility], you and your health needs are our priority.  As part of our continuing mission to provide you with exceptional heart care, we have created designated Provider Care Teams.  These Care Teams include your primary Cardiologist (physician) and Advanced Practice Providers (APPs -  Physician Assistants and Nurse Practitioners) who all work together to provide you with the care you need, when you need it.  Your next appointment:   3 months FASTING  The format for your next appointment:   In Person  Provider:   Lennie OdorWesley O'Neal, MD      Signed, Lenna GilfordWesley T. Flora Lipps'Neal, MD [facility]  [facility]  [address] [place], [place] [address] [phone]  03/06/2019 11:20 AM

## 2019-03-06 ENCOUNTER — Encounter: Payer: Self-pay | Admitting: Cardiovascular Disease

## 2019-03-06 ENCOUNTER — Other Ambulatory Visit: Payer: Self-pay

## 2019-03-06 ENCOUNTER — Ambulatory Visit (INDEPENDENT_AMBULATORY_CARE_PROVIDER_SITE_OTHER): Payer: Medicare Other | Admitting: Cardiovascular Disease

## 2019-03-06 ENCOUNTER — Ambulatory Visit (HOSPITAL_COMMUNITY)
Admission: RE | Admit: 2019-03-06 | Discharge: 2019-03-06 | Disposition: A | Discharge status: Home / Self Care | Payer: Medicare Other | Source: Ambulatory Visit | Attending: Cardiology | Admitting: Cardiology

## 2019-03-06 ENCOUNTER — Other Ambulatory Visit: Payer: Self-pay | Admitting: Cardiovascular Disease

## 2019-03-06 VITALS — BP 108/70 | HR 56 | Ht 66.0 in | Wt 116.6 lb

## 2019-03-06 DIAGNOSIS — S81802A Unspecified open wound, left lower leg, initial encounter: Secondary | ICD-10-CM | POA: Diagnosis present

## 2019-03-06 DIAGNOSIS — I10 Essential (primary) hypertension: Secondary | ICD-10-CM

## 2019-03-06 DIAGNOSIS — Z720 Tobacco use: Secondary | ICD-10-CM | POA: Diagnosis not present

## 2019-03-06 DIAGNOSIS — I700 Atherosclerosis of aorta: Secondary | ICD-10-CM

## 2019-03-06 DIAGNOSIS — R001 Bradycardia, unspecified: Secondary | ICD-10-CM

## 2019-03-06 MED ORDER — ASPIRIN EC 81 MG PO TBEC: 81.0000 mg | DELAYED_RELEASE_TABLET | Freq: Every day | ORAL | 3 refills | Status: DC

## 2019-03-06 MED ORDER — ROSUVASTATIN CALCIUM 10 MG PO TABS: 10.0000 mg | ORAL_TABLET | Freq: Every day | ORAL | 3 refills | Status: DC

## 2019-03-06 NOTE — Patient Instructions (Signed)
Medication Instructions:  START ASPIRIN 81 MG ONCE DAILY  START ROSUVASTATIN 10 MG ONCE DAILY  *If you need a refill on your cardiac medications before your next appointment, please call your pharmacy*  Lab Work: If you have labs (blood work) drawn today and your tests are completely normal, you will receive your results only by: Marland Kitchen MyChart Message (if you have MyChart) OR . A paper copy in the mail If you have any lab test that is abnormal or we need to change your treatment, we will call you to review the results.  Testing/Procedures: Your physician has requested that you have a lower extremity arterial duplex. During this test, ultrasound are used to evaluate arterial blood flow in the legs. Allow one hour for this exam. There are no restrictions or special instructions. [facility]  Follow-Up: At [facility], you and your health needs are our priority.  As part of our continuing mission to provide you with exceptional heart care, we have created designated Provider Care Teams.  These Care Teams include your primary Cardiologist (physician) and Advanced Practice Providers (APPs -  Physician Assistants and Nurse Practitioners) who all work together to provide you with the care you need, when you need it.  Your next appointment:   3 months FASTING  The format for your next appointment:   In Person  Provider:   Eleonore Chiquito, MD

## 2019-03-07 ENCOUNTER — Telehealth: Payer: Self-pay | Admitting: Cardiovascular Disease

## 2019-03-07 NOTE — Telephone Encounter (Signed)
Called Melody White to give results of normal ABI. No answer. Left message for her to call us back.   Evalina Field, MD

## 2019-03-12 ENCOUNTER — Other Ambulatory Visit (INDEPENDENT_AMBULATORY_CARE_PROVIDER_SITE_OTHER): Payer: Medicare Other

## 2019-03-12 DIAGNOSIS — I700 Atherosclerosis of aorta: Secondary | ICD-10-CM

## 2019-03-12 DIAGNOSIS — S81802A Unspecified open wound, left lower leg, initial encounter: Secondary | ICD-10-CM

## 2019-03-12 DIAGNOSIS — R001 Bradycardia, unspecified: Secondary | ICD-10-CM

## 2019-03-12 DIAGNOSIS — I10 Essential (primary) hypertension: Secondary | ICD-10-CM

## 2019-06-05 NOTE — Progress Notes (Signed)
Cardiology Office Note:   Date:  06/06/2019  NAME:  Melody White    MRN: [id number] DOB:  May 16, 1950   PCP:  Richmond Campbell., PA-C  Cardiologist:  Reatha Harps, MD  Electrophysiologist:  None   Referring MD: Richmond Campbell., PA-C   Chief Complaint  Patient presents with  . Follow-up   History of Present Illness:   Melody White is a 69 y.o. female with a hx of TIA, tobacco abuse, hypertension who presents for follow-up.  She was evaluated in October after aortic calcifications were seen on a chest x-ray.  She was started on Crestor and her aspirin reduced to 81 mg.  She was noted to have a lower extremity wound and due to significant smoking history underwent ABIs.  There is no significant PAD noted. She was started on Crestor 10 mg daily at her last visit.  She reports no issues with this medication.  She reports she will see her primary care physician next month and will have it rechecked then.  Blood pressure well controlled today.  She still smoking up to a pack a day.  She is done so for a number of years.  She reports is not interested in quitting.  She has no symptoms of angina or shortness of breath.  She reports she does not exercise routinely but does take care of her 79-year-old grandchild.  She reports no issues with watching the child or chasing the child around the house.  She is reassured that her leg ultrasounds were inconsistent with a diagnosis of PAD.  She is still not interested in smoking cessation.  1. Aortic atherosclerosis 2. Hyperlipidemia (LDL 101) 3. Stroke 4. LE wound  5. Tobacco abuse   Past Medical History: Past Medical History:  Diagnosis Date  . Hypertension   . Stroke (HCC)   . Thyroid disease     Past Surgical History: Past Surgical History:  Procedure Laterality Date  . ABDOMINAL HYSTERECTOMY    . bowel obstruction    . BREAST EXCISIONAL BIOPSY Right 20+ yrs ago   benign  . SHOULDER SURGERY      Current Medications: Current Meds    Medication Sig  . amitriptyline (ELAVIL) 25 MG tablet Take 1 tablet by mouth at bedtime.  . Ascorbic Acid (VITAMIN C) 1000 MG tablet Take 1,000 mg by mouth daily.  Marland Kitchen aspirin EC 81 MG tablet Take 1 tablet (81 mg total) by mouth daily.  . diazepam (VALIUM) 10 MG tablet 1 tablet. Into the vagina every night as needed for pelvic floor spasms  . Echinacea 400 MG CAPS Take 400 mg by mouth daily.  Marland Kitchen escitalopram (LEXAPRO) 10 MG tablet Take 1 tablet by mouth daily.  . fluticasone (FLONASE) 50 MCG/ACT nasal spray Place 2 sprays into the nose as needed.  Marland Kitchen levocetirizine (XYZAL) 5 MG tablet Take 1 tablet by mouth as needed.  Marland Kitchen levothyroxine (SYNTHROID) 25 MCG tablet Take 1 tablet by mouth daily.  Marland Kitchen lisinopril (ZESTRIL) 10 MG tablet Take 1 tablet by mouth daily.  . Multiple Vitamin (MULTIVITAMIN) capsule Take 1 capsule by mouth daily.     Allergies:    Amoxicillin, Azithromycin, Clindamycin, Codeine, Fexofenadine, Hydrocodone-acetaminophen, Hydrocodone-homatropine, and Sulfamethoxazole   Social History: Social History   Socioeconomic History  . Marital status: Married    Spouse name: Not on file  . Number of children: 0  . Years of education: Not on file  . Highest education level: Not on file  Occupational History  .  Occupation: retired   Tobacco Use  . Smoking status: Current Every Day Smoker    Packs/day: 0.50    Years: 50.00    Pack years: 25.00    Types: Cigarettes  . Smokeless tobacco: Never Used  Substance and Sexual Activity  . Alcohol use: Not Currently  . Drug use: Not on file  . Sexual activity: Not on file  Other Topics Concern  . Not on file  Social History Narrative  . Not on file   Social Determinants of Health   Financial Resource Strain:   . Difficulty of Paying Living Expenses: Not on file  Food Insecurity:   . Worried About Programme researcher, broadcasting/film/video in the Last Year: Not on file  . Ran Out of Food in the Last Year: Not on file  Transportation Needs:   . Lack of  Transportation (Medical): Not on file  . Lack of Transportation (Non-Medical): Not on file  Physical Activity:   . Days of Exercise per Week: Not on file  . Minutes of Exercise per Session: Not on file  Stress:   . Feeling of Stress : Not on file  Social Connections:   . Frequency of Communication with Friends and Family: Not on file  . Frequency of Social Gatherings with Friends and Family: Not on file  . Attends Religious Services: Not on file  . Active Member of Clubs or Organizations: Not on file  . Attends Banker Meetings: Not on file  . Marital Status: Not on file     Family History: The patient's family history includes Arthritis in her mother.  ROS:   All other ROS reviewed and negative. Pertinent positives noted in the HPI.     EKGs/Labs/Other Studies Reviewed:   The following studies were personally reviewed by me today:  ABI 03/06/2019 Right: Resting right ankle-brachial index is within normal range. No evidence of significant right lower extremity arterial disease. The right toe-brachial index is abnormal.  Left: Resting left ankle-brachial index is within normal range. No evidence of significant left lower extremity arterial disease. The left toe-brachial index is normal.  Recent Labs: No results found for requested labs within last 8760 hours.   Recent Lipid Panel No results found for: CHOL, TRIG, HDL, CHOLHDL, VLDL, LDLCALC, LDLDIRECT  Physical Exam:   VS:  BP 103/69   Pulse 71   Ht 5\' 6"  (1.676 m)   Wt 119 lb 3.2 oz (54.1 kg)   SpO2 92%   BMI 19.24 kg/m    Wt Readings from Last 3 Encounters:  06/06/19 119 lb 3.2 oz (54.1 kg)  03/06/19 116 lb 9.6 oz (52.9 kg)    General: Well nourished, well developed, in no acute distress Heart: Atraumatic, normal size  Eyes: PEERLA, EOMI  Neck: Supple, no JVD Endocrine: No thryomegaly Cardiac: Normal S1, S2; RRR; no murmurs, rubs, or gallops Lungs: Clear to auscultation bilaterally, no wheezing,  rhonchi or rales  Abd: Soft, nontender, no hepatomegaly  Ext: No edema, pulses 2+ Musculoskeletal: No deformities, BUE and BLE strength normal and equal Skin: Warm and dry, no rashes   Neuro: Alert and oriented to person, place, time, and situation, CNII-XII grossly intact, no focal deficits  Psych: Normal mood and affect   ASSESSMENT:   Melody White is a 69 y.o. female who presents for the following: 1. Aortic calcification (HCC)   2. Mixed hyperlipidemia   3. Tobacco abuse     PLAN:   1. Aortic calcification (HCC) 2.  Mixed hyperlipidemia -Related to heavy smoking use.  No symptoms of angina or cardiac symptoms reported.  PAD screen was negative.  The main treatment forward will be an aspirin 81 mg daily as well as statin therapy.  She is on Crestor 10 mg daily.  Her LDL goal is less than 70.  We will send our records to a primary care physician so they can further titrate her cholesterol medication.  I see no reason for her to follow with [place] at this time.  We will see her on an as-needed basis.  3. Tobacco abuse -Heavy smoking history.  Smoking cessation counseling provided.  I doubt she will stop smoking.  Disposition: No follow-ups on file.  Medication Adjustments/Labs and Tests Ordered: Current medicines are reviewed at length with the patient today.  Concerns regarding medicines are outlined above.  No orders of the defined types were placed in this encounter.  Meds ordered this encounter  Medications  . rosuvastatin (CRESTOR) 10 MG tablet    Sig: Take 1 tablet (10 mg total) by mouth daily.    Dispense:  90 tablet    Refill:  3    Patient Instructions  Medication Instructions:  The current medical regimen is effective;  continue present plan and medications.  *If you need a refill on your cardiac medications before your next appointment, please call your pharmacy*  Follow-Up: At [facility], you and your health needs are our priority.  As part of our continuing  mission to provide you with exceptional heart care, we have created designated Provider Care Teams.  These Care Teams include your primary Cardiologist (physician) and Advanced Practice Providers (APPs -  Physician Assistants and Nurse Practitioners) who all work together to provide you with the care you need, when you need it.  Your next appointment:   As needed  The format for your next appointment:   Either In Person or Virtual  Provider:   Eleonore Chiquito, MD       Signed, Addison Naegeli. Audie Box, [place]  [address] [place], [place] [address] [phone]  06/06/2019 11:32 AM

## 2019-06-06 ENCOUNTER — Ambulatory Visit (INDEPENDENT_AMBULATORY_CARE_PROVIDER_SITE_OTHER): Payer: Medicare Other | Admitting: Cardiovascular Disease

## 2019-06-06 ENCOUNTER — Other Ambulatory Visit: Payer: Self-pay

## 2019-06-06 ENCOUNTER — Encounter: Payer: Self-pay | Admitting: Cardiovascular Disease

## 2019-06-06 VITALS — BP 103/69 | HR 71 | Ht 66.0 in | Wt 119.2 lb

## 2019-06-06 DIAGNOSIS — I700 Atherosclerosis of aorta: Secondary | ICD-10-CM | POA: Diagnosis not present

## 2019-06-06 DIAGNOSIS — E782 Mixed hyperlipidemia: Secondary | ICD-10-CM

## 2019-06-06 DIAGNOSIS — Z720 Tobacco use: Secondary | ICD-10-CM

## 2019-06-06 MED ORDER — ROSUVASTATIN CALCIUM 10 MG PO TABS: 10.0000 mg | ORAL_TABLET | Freq: Every day | ORAL | 3 refills | Status: DC

## 2019-06-06 NOTE — Patient Instructions (Signed)
Medication Instructions:  The current medical regimen is effective;  continue present plan and medications.  *If you need a refill on your cardiac medications before your next appointment, please call your pharmacy*  Follow-Up: At [facility], you and your health needs are our priority.  As part of our continuing mission to provide you with exceptional heart care, we have created designated Provider Care Teams.  These Care Teams include your primary Cardiologist (physician) and Advanced Practice Providers (APPs -  Physician Assistants and Nurse Practitioners) who all work together to provide you with the care you need, when you need it.  Your next appointment:   As needed  The format for your next appointment:   Either In Person or Virtual  Provider:   Republic O'Neal, MD   

## 2019-09-22 ENCOUNTER — Telehealth: Payer: Self-pay | Admitting: Cardiovascular Disease

## 2019-09-22 NOTE — Telephone Encounter (Signed)
New message  Pt c/o medication issue:  1. Name of Medication: rosuvastatin (CRESTOR) 10 MG tablet(Expired)  2. How are you currently taking this medication (dosage and times per day)? Stopped taking  3. Are you having a reaction (difficulty breathing--STAT)? Bloating and unable to urinate  4. What is your medication issue?  Patient states that she has stopped taking medication because it has caused her to be bloated and unable to urinate. Plase call to discuss.

## 2019-09-22 NOTE — Telephone Encounter (Signed)
Prescribe lipitor 10 mg daily.   Gerri Spore T. Flora Lipps, MD [facility]  [address], [address] [place], [place] [address] [phone]  4:18 PM

## 2019-09-22 NOTE — Telephone Encounter (Signed)
Spoke with patient. Patient wanted to notify Dr. Flora Lipps that she stopped taking the Crestor because when she was on the medication it caused decreased urination and constipation. She reports that she usually urinates frequently but that she was only urinating about 2X per day on the medication. Asked if she wanted Dr. Flora Lipps to recommend/prescribe another medication and patient reports she is going to talk with her PCP about options for her cholesterol. Medication list updated.

## 2019-09-22 NOTE — Telephone Encounter (Signed)
Noted. Thanks!  Will wait for patient to see PCP.

## 2020-08-19 ENCOUNTER — Other Ambulatory Visit: Payer: Self-pay | Admitting: Family Medicine

## 2020-08-19 DIAGNOSIS — E2839 Other primary ovarian failure: Secondary | ICD-10-CM

## 2020-08-19 DIAGNOSIS — Z1231 Encounter for screening mammogram for malignant neoplasm of breast: Secondary | ICD-10-CM

## 2020-09-20 ENCOUNTER — Encounter (HOSPITAL_COMMUNITY): Payer: Self-pay

## 2020-09-20 ENCOUNTER — Inpatient Hospital Stay (HOSPITAL_COMMUNITY)
Admission: EM | Admit: 2020-09-20 | Discharge: 2020-09-23 | DRG: 190 | Disposition: A | Discharge status: Home / Self Care | Payer: Medicare Other | Attending: Family Medicine | Admitting: Family Medicine

## 2020-09-20 ENCOUNTER — Emergency Department (HOSPITAL_COMMUNITY): Payer: Medicare Other

## 2020-09-20 ENCOUNTER — Other Ambulatory Visit: Payer: Self-pay

## 2020-09-20 DIAGNOSIS — F32A Depression, unspecified: Secondary | ICD-10-CM | POA: Diagnosis present

## 2020-09-20 DIAGNOSIS — R339 Retention of urine, unspecified: Secondary | ICD-10-CM | POA: Diagnosis present

## 2020-09-20 DIAGNOSIS — Z7982 Long term (current) use of aspirin: Secondary | ICD-10-CM | POA: Diagnosis not present

## 2020-09-20 DIAGNOSIS — Z79899 Other long term (current) drug therapy: Secondary | ICD-10-CM

## 2020-09-20 DIAGNOSIS — F17210 Nicotine dependence, cigarettes, uncomplicated: Secondary | ICD-10-CM | POA: Diagnosis present

## 2020-09-20 DIAGNOSIS — N3289 Other specified disorders of bladder: Secondary | ICD-10-CM | POA: Diagnosis present

## 2020-09-20 DIAGNOSIS — J9601 Acute respiratory failure with hypoxia: Secondary | ICD-10-CM | POA: Diagnosis present

## 2020-09-20 DIAGNOSIS — Z681 Body mass index (BMI) 19 or less, adult: Secondary | ICD-10-CM

## 2020-09-20 DIAGNOSIS — Z79890 Hormone replacement therapy (postmenopausal): Secondary | ICD-10-CM

## 2020-09-20 DIAGNOSIS — I10 Essential (primary) hypertension: Secondary | ICD-10-CM

## 2020-09-20 DIAGNOSIS — Z8673 Personal history of transient ischemic attack (TIA), and cerebral infarction without residual deficits: Secondary | ICD-10-CM | POA: Diagnosis not present

## 2020-09-20 DIAGNOSIS — Z885 Allergy status to narcotic agent status: Secondary | ICD-10-CM | POA: Diagnosis not present

## 2020-09-20 DIAGNOSIS — Z720 Tobacco use: Secondary | ICD-10-CM

## 2020-09-20 DIAGNOSIS — D72828 Other elevated white blood cell count: Secondary | ICD-10-CM | POA: Diagnosis present

## 2020-09-20 DIAGNOSIS — Z881 Allergy status to other antibiotic agents status: Secondary | ICD-10-CM | POA: Diagnosis not present

## 2020-09-20 DIAGNOSIS — R059 Cough, unspecified: Secondary | ICD-10-CM | POA: Diagnosis not present

## 2020-09-20 DIAGNOSIS — Z888 Allergy status to other drugs, medicaments and biological substances status: Secondary | ICD-10-CM | POA: Diagnosis not present

## 2020-09-20 DIAGNOSIS — Z90710 Acquired absence of both cervix and uterus: Secondary | ICD-10-CM

## 2020-09-20 DIAGNOSIS — J40 Bronchitis, not specified as acute or chronic: Secondary | ICD-10-CM

## 2020-09-20 DIAGNOSIS — Z880 Allergy status to penicillin: Secondary | ICD-10-CM | POA: Diagnosis not present

## 2020-09-20 DIAGNOSIS — J441 Chronic obstructive pulmonary disease with (acute) exacerbation: Principal | ICD-10-CM | POA: Diagnosis present

## 2020-09-20 DIAGNOSIS — Z7951 Long term (current) use of inhaled steroids: Secondary | ICD-10-CM | POA: Diagnosis not present

## 2020-09-20 DIAGNOSIS — E039 Hypothyroidism, unspecified: Secondary | ICD-10-CM

## 2020-09-20 DIAGNOSIS — E440 Moderate protein-calorie malnutrition: Secondary | ICD-10-CM | POA: Diagnosis present

## 2020-09-20 DIAGNOSIS — Z20822 Contact with and (suspected) exposure to covid-19: Secondary | ICD-10-CM | POA: Diagnosis present

## 2020-09-20 DIAGNOSIS — Z882 Allergy status to sulfonamides status: Secondary | ICD-10-CM

## 2020-09-20 DIAGNOSIS — E785 Hyperlipidemia, unspecified: Secondary | ICD-10-CM

## 2020-09-20 LAB — COMPREHENSIVE METABOLIC PANEL
ALT: 14 U/L (ref 0–44)
AST: 27 U/L (ref 15–41)
Albumin: 3.6 g/dL (ref 3.5–5.0)
Alkaline Phosphatase: 76 U/L (ref 38–126)
Anion gap: 7 (ref 5–15)
BUN: 12 mg/dL (ref 8–23)
CO2: 32 mmol/L (ref 22–32)
Calcium: 9.5 mg/dL (ref 8.9–10.3)
Chloride: 97 mmol/L — ABNORMAL LOW (ref 98–111)
Creatinine, Ser: 0.67 mg/dL (ref 0.44–1.00)
GFR, Estimated: >60 mL/min (ref >60)
Glucose, Bld: 102 mg/dL — ABNORMAL HIGH (ref 70–99)
Potassium: 4.1 mmol/L (ref 3.5–5.1)
Sodium: 136 mmol/L (ref 135–145)
Total Bilirubin: 0.5 mg/dL (ref 0.3–1.2)
Total Protein: 7.1 g/dL (ref 6.5–8.1)

## 2020-09-20 LAB — CBC WITH DIFFERENTIAL/PLATELET
Abs Immature Granulocytes: 0.04 10*3/uL (ref 0.00–0.07)
Basophils Absolute: 0.1 10*3/uL (ref 0.0–0.1)
Basophils Relative: 1 %
Eosinophils Absolute: 0.2 10*3/uL (ref 0.0–0.5)
Eosinophils Relative: 2 %
HCT: 52.1 % — ABNORMAL HIGH (ref 36.0–46.0)
Hemoglobin: 17.2 g/dL — ABNORMAL HIGH (ref 12.0–15.0)
Immature Granulocytes: 0 %
Lymphocytes Relative: 11 %
Lymphs Abs: 1.2 10*3/uL (ref 0.7–4.0)
MCH: 33.1 pg (ref 26.0–34.0)
MCHC: 33 g/dL (ref 30.0–36.0)
MCV: 100.4 fL — ABNORMAL HIGH (ref 80.0–100.0)
Monocytes Absolute: 1.2 10*3/uL — ABNORMAL HIGH (ref 0.1–1.0)
Monocytes Relative: 11 %
Neutro Abs: 8.3 10*3/uL — ABNORMAL HIGH (ref 1.7–7.7)
Neutrophils Relative %: 75 %
Platelets: 191 10*3/uL (ref 150–400)
RBC: 5.19 MIL/uL — ABNORMAL HIGH (ref 3.87–5.11)
RDW: 13.7 % (ref 11.5–15.5)
WBC: 10.9 10*3/uL — ABNORMAL HIGH (ref 4.0–10.5)
nRBC: 0 % (ref 0.0–0.2)

## 2020-09-20 LAB — RESP PANEL BY RT-PCR (FLU A&B, COVID) ARPGX2
Influenza A by PCR: NEGATIVE
Influenza B by PCR: NEGATIVE
SARS Coronavirus 2 by RT PCR: NEGATIVE

## 2020-09-20 MED ORDER — MOMETASONE FURO-FORMOTEROL FUM 200-5 MCG/ACT IN AERO
2.0000 | INHALATION_SPRAY | Freq: Two times a day (BID) | RESPIRATORY_TRACT | Status: DC
  Administered 2020-09-21 – 2020-09-23 (×4): 2 via RESPIRATORY_TRACT
  Filled 2020-09-20: qty 8.8

## 2020-09-20 MED ORDER — LEVOFLOXACIN IN D5W 750 MG/150ML IV SOLN: 750.0000 mg | INTRAVENOUS | Status: DC

## 2020-09-20 MED ORDER — ESCITALOPRAM OXALATE 20 MG PO TABS
20.0000 mg | ORAL_TABLET | Freq: Every day | ORAL | Status: DC
  Administered 2020-09-21 – 2020-09-23 (×3): 20 mg via ORAL
  Filled 2020-09-20 (×3): qty 1

## 2020-09-20 MED ORDER — LEVOTHYROXINE SODIUM 25 MCG PO TABS
25.0000 ug | ORAL_TABLET | Freq: Every day | ORAL | Status: DC
  Administered 2020-09-21 – 2020-09-23 (×3): 25 ug via ORAL
  Filled 2020-09-20 (×3): qty 1

## 2020-09-20 MED ORDER — ROSUVASTATIN CALCIUM 10 MG PO TABS
10.0000 mg | ORAL_TABLET | Freq: Every day | ORAL | Status: DC
  Administered 2020-09-21 – 2020-09-23 (×3): 10 mg via ORAL
  Filled 2020-09-20 (×3): qty 1

## 2020-09-20 MED ORDER — MONTELUKAST SODIUM 10 MG PO TABS
10.0000 mg | ORAL_TABLET | Freq: Every day | ORAL | Status: DC
  Administered 2020-09-20 – 2020-09-22 (×3): 10 mg via ORAL
  Filled 2020-09-20 (×4): qty 1

## 2020-09-20 MED ORDER — ALBUTEROL SULFATE (2.5 MG/3ML) 0.083% IN NEBU
5.0000 mg | INHALATION_SOLUTION | Freq: Once | RESPIRATORY_TRACT | Status: AC
  Administered 2020-09-20: 5 mg via RESPIRATORY_TRACT
  Filled 2020-09-20: qty 6

## 2020-09-20 MED ORDER — ALBUTEROL (5 MG/ML) CONTINUOUS INHALATION SOLN
15.0000 mg/h | INHALATION_SOLUTION | Freq: Once | RESPIRATORY_TRACT | Status: AC
  Administered 2020-09-20: 15 mg/h via RESPIRATORY_TRACT
  Filled 2020-09-20: qty 20

## 2020-09-20 MED ORDER — IPRATROPIUM BROMIDE 0.02 % IN SOLN
0.5000 mg | Freq: Once | RESPIRATORY_TRACT | Status: AC
  Administered 2020-09-20: 0.5 mg via RESPIRATORY_TRACT
  Filled 2020-09-20: qty 2.5

## 2020-09-20 MED ORDER — ASPIRIN EC 81 MG PO TBEC
81.0000 mg | DELAYED_RELEASE_TABLET | Freq: Every day | ORAL | Status: DC
  Administered 2020-09-21 – 2020-09-23 (×3): 81 mg via ORAL
  Filled 2020-09-20 (×3): qty 1

## 2020-09-20 MED ORDER — DIAZEPAM 5 MG PO TABS: 10.0000 mg | ORAL_TABLET | Freq: Every evening | ORAL | Status: DC | PRN

## 2020-09-20 MED ORDER — ENOXAPARIN SODIUM 40 MG/0.4ML IJ SOSY
40.0000 mg | PREFILLED_SYRINGE | INTRAMUSCULAR | Status: DC
  Administered 2020-09-20 – 2020-09-22 (×3): 40 mg via SUBCUTANEOUS
  Filled 2020-09-20 (×3): qty 0.4

## 2020-09-20 MED ORDER — MAGNESIUM SULFATE 2 GM/50ML IV SOLN
2.0000 g | Freq: Once | INTRAVENOUS | Status: AC
  Administered 2020-09-20: 2 g via INTRAVENOUS
  Filled 2020-09-20: qty 50

## 2020-09-20 MED ORDER — AMITRIPTYLINE HCL 25 MG PO TABS
25.0000 mg | ORAL_TABLET | Freq: Every day | ORAL | Status: DC
  Administered 2020-09-20 – 2020-09-22 (×3): 25 mg via ORAL
  Filled 2020-09-20 (×3): qty 1

## 2020-09-20 MED ORDER — LEVOCETIRIZINE DIHYDROCHLORIDE 5 MG PO TABS: 5.0000 mg | ORAL_TABLET | Freq: Every day | ORAL | Status: DC

## 2020-09-20 MED ORDER — SODIUM CHLORIDE 0.9 % IV BOLUS
500.0000 mL | Freq: Once | INTRAVENOUS | Status: AC
  Administered 2020-09-20: 500 mL via INTRAVENOUS

## 2020-09-20 MED ORDER — LEVOFLOXACIN IN D5W 750 MG/150ML IV SOLN
750.0000 mg | Freq: Once | INTRAVENOUS | Status: AC
  Administered 2020-09-20: 750 mg via INTRAVENOUS
  Filled 2020-09-20: qty 150

## 2020-09-20 MED ORDER — METHYLPREDNISOLONE SODIUM SUCC 125 MG IJ SOLR
125.0000 mg | Freq: Once | INTRAMUSCULAR | Status: AC
  Administered 2020-09-20: 125 mg via INTRAVENOUS
  Filled 2020-09-20: qty 2

## 2020-09-20 MED ORDER — METHYLPREDNISOLONE SODIUM SUCC 40 MG IJ SOLR
40.0000 mg | Freq: Every day | INTRAMUSCULAR | Status: DC
  Administered 2020-09-21: 40 mg via INTRAVENOUS
  Filled 2020-09-20: qty 1

## 2020-09-20 MED ORDER — ASCORBIC ACID 500 MG PO TABS
1000.0000 mg | ORAL_TABLET | Freq: Every day | ORAL | Status: DC
  Administered 2020-09-21 – 2020-09-23 (×3): 1000 mg via ORAL
  Filled 2020-09-20 (×3): qty 2

## 2020-09-20 MED ORDER — LISINOPRIL 10 MG PO TABS
10.0000 mg | ORAL_TABLET | Freq: Every day | ORAL | Status: DC
  Administered 2020-09-21 – 2020-09-23 (×3): 10 mg via ORAL
  Filled 2020-09-20 (×3): qty 1

## 2020-09-20 MED ORDER — IPRATROPIUM-ALBUTEROL 0.5-2.5 (3) MG/3ML IN SOLN
3.0000 mL | Freq: Four times a day (QID) | RESPIRATORY_TRACT | Status: DC
  Administered 2020-09-21 – 2020-09-22 (×7): 3 mL via RESPIRATORY_TRACT
  Filled 2020-09-20 (×7): qty 3

## 2020-09-20 MED ORDER — LORATADINE 10 MG PO TABS
10.0000 mg | ORAL_TABLET | Freq: Every day | ORAL | Status: DC
  Administered 2020-09-21 – 2020-09-23 (×3): 10 mg via ORAL
  Filled 2020-09-20 (×3): qty 1

## 2020-09-20 NOTE — ED Notes (Signed)
Pt states that she doesn't not use oxygen at home and so pt was ambulated to the restroom on rm air. When pt returned to room, she denied feeling SOB however her o2 sats were 68%. Pt was placed back onto 5l [place] and breathing tx was started. Sats recovered quickly to 95% on 5 liters [place].

## 2020-09-20 NOTE — ED Triage Notes (Signed)
Per EMS- Patient went to an UC in [place] with c/o a productive cough with SOB x 3 days.  EMS was called because her room air sats were 77% at the office. Patient was placed on O24L/min via [place] and sats increased to 93%.

## 2020-09-20 NOTE — H&P (Signed)
History and Physical    ANNER BAITY FGH:[id number] DOB: 03/04/1951 DOA: 09/20/2020  PCP: Richmond Campbell., PA-C  Patient coming from: Home, sister at bedside  I have personally briefly reviewed patient's old medical records in [facility]  Chief Complaint: Cough, fatigue  HPI: Melody White is a 70 y.o. female with medical history significant for history of CVA, hypertension, hypothyroidism, hyperlipidemia and current tobacco use who presents with concerns of hypoxia.  About 3 days ago patient woke up feeling some sinus congestion and sinus pressure.  She then also developed a cough productive of sputum.  Over the weekend her cough continue to get worse with increasing fatigue with ambulation.  Has not been able to sleep due to increasing cough.  No fever.  No sick contact.  She has received 2 doses of Pfizer vaccine.  She presented to urgent care with the symptoms today and was found to be 77% oxygen saturation on room air.  Patient does not have any diagnosis of asthma or COPD.  She is a current tobacco user half a pack per day.  ED Course: She was placed on 5 L with O2 around 91%.  CBC shows leukocytosis of 10.9.  Hemoglobin 17.2.  BMP otherwise unremarkable.  Chest x-ray negative.  Negative COVID PCR and influenza test.  She was given Mg, DuoNeb x2, IV Solu-Medrol 125 mg, Levaquin and later placed on continuous albuterol nebulizer.  Review of Systems: Constitutional: No Weight Change, No Fever ENT/Mouth: No sore throat, No Rhinorrhea Eyes: No Eye Pain, No Vision Changes Cardiovascular: No Chest Pain, + SOB, No PND,+ Dyspnea on Exertion, No Orthopnea,  No Edema, No Palpitations Respiratory: +Cough, + Sputum, No Wheezing, no Dyspnea  Gastrointestinal: No Nausea, No Vomiting, No Diarrhea, No Constipation, No Pain Genitourinary: no Urinary Incontinence, No Urgency, No Flank Pain Musculoskeletal: No Arthralgias, No Myalgias Skin: No Skin Lesions, No Pruritus, Neuro: no Weakness,  No Numbness,  No Loss of Consciousness, No Syncope Psych: No Anxiety/Panic, No Depression, no decrease appetite Heme/Lymph: No Bruising, No Bleeding  Past Medical History:  Diagnosis Date  . Hypertension   . Stroke (HCC)   . Thyroid disease     Past Surgical History:  Procedure Laterality Date  . ABDOMINAL HYSTERECTOMY    . bowel obstruction    . BREAST EXCISIONAL BIOPSY Right 20+ yrs ago   benign  . SHOULDER SURGERY       reports that she has been smoking cigarettes. She has a 25.00 pack-year smoking history. She has never used smokeless tobacco. She reports previous alcohol use. She reports previous drug use. Social History  Allergies  Allergen Reactions  . Amoxicillin Other (See Comments)    other   . Azithromycin Itching  . Clindamycin Other (See Comments)    Funny feeling other   . Codeine Itching and Other (See Comments)    other   . Fexofenadine Other (See Comments)    Funny feeling other   . Hydrocodone Bit-Homatrop Mbr Itching  . Sulfamethoxazole Nausea Only and Other (See Comments)    other     Family History  Problem Relation Age of Onset  . Arthritis Mother      Prior to Admission medications   Medication Sig Start Date End Date Taking? Authorizing Provider  amitriptyline (ELAVIL) 25 MG tablet Take 25 mg by mouth at bedtime. 11/29/18 09/20/20 Yes [provider]  Ascorbic Acid (VITAMIN C) 1000 MG tablet Take 1,000 mg by mouth daily.   Yes [provider]  aspirin EC 81 MG tablet Take 1 tablet (81 mg total) by mouth daily. 03/06/19  Yes O'Neal, Ronnald Ramp, MD  budesonide-formoterol [facility]) 160-4.5 MCG/ACT inhaler Inhale 2 puffs into the lungs 2 (two) times daily. 06/03/20  Yes [provider]  diazepam (VALIUM) 10 MG tablet Take 10 mg by mouth at bedtime as needed for anxiety or sleep. Into the vagina every night as needed for pelvic floor spasms 11/29/18  Yes [provider]  Echinacea 400 MG CAPS Take 400 mg  by mouth daily.   Yes [provider]  escitalopram (LEXAPRO) 20 MG tablet Take 20 mg by mouth daily.   Yes [provider]  fluticasone (FLONASE) 50 MCG/ACT nasal spray Place 2 sprays into the nose daily as needed for allergies. 12/04/17  Yes [provider]  ibuprofen (ADVIL) 200 MG tablet Take 400 mg by mouth every 6 (six) hours as needed for mild pain.   Yes [provider]  levocetirizine (XYZAL) 5 MG tablet Take 1 tablet by mouth daily.   Yes [provider]  levothyroxine (SYNTHROID) 25 MCG tablet Take 25 mcg by mouth daily. 12/26/18  Yes [provider]  lisinopril (ZESTRIL) 10 MG tablet Take 10 mg by mouth daily. 12/26/18  Yes [provider]  montelukast (SINGULAIR) 10 MG tablet Take 10 mg by mouth at bedtime.   Yes [provider]  Multiple Vitamin (MULTIVITAMIN WITH MINERALS) TABS tablet Take 1 tablet by mouth daily.   Yes [provider]  Multiple Vitamin (MULTIVITAMIN) capsule Take 1 capsule by mouth daily.   Yes [provider]  rosuvastatin (CRESTOR) 10 MG tablet Take 10 mg by mouth daily.   Yes [provider]  zinc gluconate 50 MG tablet Take 50 mg by mouth daily.   Yes [provider]    Physical Exam: Vitals:   09/20/20 2030 09/20/20 2052 09/20/20 2130 09/20/20 2141  BP: 123/69  120/66 121/65  Pulse: 84  92 89  Resp: 16   18  Temp:      TempSrc:      SpO2: 91% (!) 88% 94% 94%  Weight:      Height:        Constitutional: NAD, calm, comfortable Vitals:   09/20/20 2030 09/20/20 2052 09/20/20 2130 09/20/20 2141  BP: 123/69  120/66 121/65  Pulse: 84  92 89  Resp: 16   18  Temp:      TempSrc:      SpO2: 91% (!) 88% 94% 94%  Weight:      Height:       Eyes: PERRL, lids and conjunctivae normal ENMT: Mucous membranes are moist.  Neck: normal, supple Respiratory: Aeration with diffuse rhonchi throughout, no wheezing normal respiratory effort on 5 L and able to  speak in full sentences. No accessory muscle use.  Cardiovascular: Regular rate and rhythm, no murmurs / rubs / gallops. No extremity edema.  Abdomen: no tenderness, no masses palpated.  Bowel sounds positive.  Musculoskeletal: no clubbing / cyanosis. No joint deformity upper and lower extremities. Good ROM, no contractures. Normal muscle tone.  Skin: no rashes, lesions, ulcers. No induration Neurologic: CN 2-12 grossly intact. Sensation intact,  Strength 5/5 in all 4.  Psychiatric: Normal judgment and insight. Alert and oriented x 3. Normal mood.     Labs on Admission: I have personally reviewed following labs and imaging studies  CBC: Recent Labs  Lab 09/20/20 1646  WBC 10.9*  NEUTROABS 8.3*  HGB 17.2*  HCT 52.1*  MCV 100.4*  PLT 191   Basic Metabolic Panel: Recent Labs  Lab 09/20/20 1646  NA 136  K 4.1  CL 97*  CO2 32  GLUCOSE 102*  BUN 12  CREATININE 0.67  CALCIUM 9.5   GFR: Estimated Creatinine Clearance: 57.8 mL/min (by C-G formula based on SCr of 0.67 mg/dL). Liver Function Tests: Recent Labs  Lab 09/20/20 1646  AST 27  ALT 14  ALKPHOS 76  BILITOT 0.5  PROT 7.1  ALBUMIN 3.6   No results for input(s): LIPASE, AMYLASE in the last 168 hours. No results for input(s): AMMONIA in the last 168 hours. Coagulation Profile: No results for input(s): INR, PROTIME in the last 168 hours. Cardiac Enzymes: No results for input(s): CKTOTAL, CKMB, CKMBINDEX, TROPONINI in the last 168 hours. BNP (last 3 results) No results for input(s): PROBNP in the last 8760 hours. HbA1C: No results for input(s): HGBA1C in the last 72 hours. CBG: No results for input(s): GLUCAP in the last 168 hours. Lipid Profile: No results for input(s): CHOL, HDL, LDLCALC, TRIG, CHOLHDL, LDLDIRECT in the last 72 hours. Thyroid Function Tests: No results for input(s): TSH, T4TOTAL, FREET4, T3FREE, THYROIDAB in the last 72 hours. Anemia Panel: No results for input(s): VITAMINB12, FOLATE,  FERRITIN, TIBC, IRON, RETICCTPCT in the last 72 hours. Urine analysis: No results found for: COLORURINE, APPEARANCEUR, LABSPEC, PHURINE, GLUCOSEU, HGBUR, BILIRUBINUR, KETONESUR, PROTEINUR, UROBILINOGEN, NITRITE, LEUKOCYTESUR  Radiological Exams on Admission: DG Chest 2 View  Result Date: 09/20/2020 CLINICAL DATA:  Productive cough. EXAM: CHEST - 2 VIEW COMPARISON:  January 10, 2019 FINDINGS: The lungs are hyperinflated. Diffuse, chronic appearing increased interstitial lung markings are noted. There is no evidence of focal consolidation, pleural effusion or pneumothorax. The heart size and mediastinal contours are within normal limits. Degenerative changes seen within the thoracic spine. IMPRESSION: No active cardiopulmonary disease. Electronically Signed   By: Aram Candela M.D.   On: 09/20/2020 18:08      Assessment/Plan  Acute hypoxic respiratory failure secondary to exacerbation of undiagnosed COPD - scheduled duo-neb x6 - continue Levofloxacin  - continue IV solu-medrol  - need formal PFT outpt   Tobacco abuse -use half pack per day -encouraged cessation  History of CVA -continue aspirin  hypertension - continue Lisinopril   hypothyroidism -continue levothyroxine  hyperlipidemia -continue statin   DVT prophylaxis:.Lovenox Code Status: Full Family Communication: Plan discussed with patient and sister at bedside  disposition Plan: Home with at least 2 midnight stays  Consults called:  Admission status: inpatient  Level of care: Telemetry  Status is: Inpatient  Remains inpatient appropriate because:Inpatient level of care appropriate due to severity of illness   Dispo: The patient is from: Home              Anticipated d/c is to: Home              Patient currently is not medically stable to d/c.   Difficult to place patient No         Anselm Jungling DO [facility]   If 7PM-7AM, please contact night-coverage [email or web address]   09/20/2020, 10:01  PM

## 2020-09-20 NOTE — ED Notes (Signed)
RT called to come and administer continuous nebulizer

## 2020-09-20 NOTE — ED Provider Notes (Signed)
Emergency Medicine Provider Triage Evaluation Note  Melody White , a 70 y.o. female  was evaluated in triage.  Pt complains of cough, shortness of breath, nasal congestion.  Patient states symptoms began several days ago.  She was seen at urgent care today, was found to be hypoxic with a sat 77% on room air.  She does not wear oxygen at baseline.  She does have a history of asthma.  She reports her husband is also sick, no other sick contacts.  She is vaccinated x3 for COVID.  She denies chest pain.  Cough is productive.  She has not taken anything for her symptoms.  Review of Systems  Positive: Cough, sob, nasal congestion Negative: Fever, cp, n/v  Physical Exam  BP 126/80 (BP Location: Left Arm)   Pulse 89   Temp 98.1 F (36.7 C) (Oral)   Resp 18   Ht 5\' 6"  (1.676 m)   Wt 54.4 kg   SpO2 93%   BMI 19.37 kg/m  Gen:   Awake, no distress   Resp:  Normal effort, SpO2 mid 90's on O2 via [place], crackles in bilateral lower lobes MSK:   Moves extremities without difficulty  Other:  No leg swelling  Medical Decision Making  Medically screening exam initiated at 4:26 PM.  Appropriate orders placed.  Melody White was informed that the remainder of the evaluation will be completed by another provider, this initial triage assessment does not replace that evaluation, and the importance of remaining in the ED until their evaluation is complete.    Thomes Cake, PA-C 09/20/20 1627    11/20/20, MD 09/20/20 2234

## 2020-09-20 NOTE — ED Provider Notes (Signed)
Melody White Provider Note   CSN: [id number] Arrival date & time: 09/20/20  1554     History Chief Complaint  Patient presents with  . Shortness of Breath  . Cough    Melody White is a 70 y.o. female.  Patient is a 70 year old female with a history of hypertension, stroke, thyroid disease and tobacco abuse presenting today with cough, congestion and shortness of breath.  Patient reports symptoms started 4 days ago and have gradually worsened over the weekend.  The cough is productive of green and yellow sputum and she has had intermittent chills.  Shortness of breath started over the weekend and has gradually worsened.  She was unable to see her doctor on Friday so made an appointment today.  When she arrived at the office today sats were 77% on room air and patient was placed on 4 L nasal cannula with improvement of oxygen saturation.  She reports that the shortness of breath is better but she continues to cough.  She has had significant nasal congestion and drainage as well.  She does have a history of allergies.  She has had poor oral intake but has been drinking water.  She denies any chest pain, abdominal pain, nausea vomiting or diarrhea.  She does have inhalers at home and had tried to use those but they did not improve her symptoms.  No known sick contacts.  No recent medication changes.  She denies any unilateral leg pain or swelling.  No edema  The history is provided by the patient and medical records.  Shortness of Breath Associated symptoms: cough   Cough Associated symptoms: shortness of breath        Past Medical History:  Diagnosis Date  . Hypertension   . Stroke (HCC)   . Thyroid disease     There are no problems to display for this patient.   Past Surgical History:  Procedure Laterality Date  . ABDOMINAL HYSTERECTOMY    . bowel obstruction    . BREAST EXCISIONAL BIOPSY Right 20+ yrs ago   benign  . SHOULDER SURGERY        OB History   No obstetric history on file.     Family History  Problem Relation Age of Onset  . Arthritis Mother     Social History   Tobacco Use  . Smoking status: Current Every Day Smoker    Packs/day: 0.50    Years: 50.00    Pack years: 25.00    Types: Cigarettes  . Smokeless tobacco: Never Used  Substance Use Topics  . Alcohol use: Not Currently  . Drug use: Not Currently    Home Medications Prior to Admission medications   Medication Sig Start Date End Date Taking? Authorizing Provider  amitriptyline (ELAVIL) 25 MG tablet Take 1 tablet by mouth at bedtime. 11/29/18 11/24/19  [provider]  Ascorbic Acid (VITAMIN C) 1000 MG tablet Take 1,000 mg by mouth daily.    [provider]  aspirin EC 81 MG tablet Take 1 tablet (81 mg total) by mouth daily. 03/06/19   Sande Rives'Neal, [place] Thomas, MD  diazepam (VALIUM) 10 MG tablet 1 tablet. Into the vagina every night as needed for pelvic floor spasms 11/29/18   [provider]  Echinacea 400 MG CAPS Take 400 mg by mouth daily.    [provider]  escitalopram (LEXAPRO) 10 MG tablet Take 1 tablet by mouth daily. 12/26/18   [provider]  fluticasone (FLONASE) 50  MCG/ACT nasal spray Place 2 sprays into the nose as needed. 12/04/17   [provider]  levocetirizine (XYZAL) 5 MG tablet Take 1 tablet by mouth as needed.    [provider]  levothyroxine (SYNTHROID) 25 MCG tablet Take 1 tablet by mouth daily. 12/26/18   [provider]  lisinopril (ZESTRIL) 10 MG tablet Take 1 tablet by mouth daily. 12/26/18   [provider]  Multiple Vitamin (MULTIVITAMIN) capsule Take 1 capsule by mouth daily.    [provider]    Allergies    Amoxicillin, Azithromycin, Clindamycin, Codeine, Fexofenadine, Hydrocodone bit-homatrop mbr, Hydrocodone-acetaminophen, and Sulfamethoxazole  Review of Systems   Review of Systems  Respiratory: Positive for cough and  shortness of breath.   All other systems reviewed and are negative.   Physical Exam Updated Vital Signs BP 113/81   Pulse 78   Temp 98.1 F (36.7 C) (Oral)   Resp 18   Ht 5\' 6"  (1.676 m)   Wt 54.4 kg   SpO2 93%   BMI 19.37 kg/m   Physical Exam Vitals and nursing note reviewed.  Constitutional:      General: She is not in acute distress.    Appearance: She is well-developed.  HENT:     Head: Normocephalic and atraumatic.  Eyes:     Pupils: Pupils are equal, round, and reactive to light.  Cardiovascular:     Rate and Rhythm: Normal rate and regular rhythm.     Heart sounds: Normal heart sounds. No murmur heard. No friction rub.  Pulmonary:     Effort: Pulmonary effort is normal. Tachypnea present.     Breath sounds: Wheezing and rhonchi present. No rales.  Abdominal:     General: Bowel sounds are normal. There is no distension.     Palpations: Abdomen is soft.     Tenderness: There is no abdominal tenderness. There is no guarding or rebound.  Musculoskeletal:        General: No tenderness. Normal range of motion.     Cervical back: Normal range of motion.     Right lower leg: No edema.     Left lower leg: No edema.     Comments: No edema  Skin:    General: Skin is warm and dry.     Findings: No rash.  Neurological:     Mental Status: She is alert and oriented to person, place, and time. Mental status is at baseline.     Cranial Nerves: No cranial nerve deficit.  Psychiatric:        Mood and Affect: Mood normal.        Behavior: Behavior normal.      ED Results / Procedures / Treatments   Labs (all labs ordered are listed, but only abnormal results are displayed) Labs Reviewed  CBC WITH DIFFERENTIAL/PLATELET - Abnormal; Notable for the following components:      Result Value   WBC 10.9 (*)    RBC 5.19 (*)    Hemoglobin 17.2 (*)    HCT 52.1 (*)    MCV 100.4 (*)    Neutro Abs 8.3 (*)    Monocytes Absolute 1.2 (*)    All other components within normal  limits  COMPREHENSIVE METABOLIC PANEL - Abnormal; Notable for the following components:   Chloride 97 (*)    Glucose, Bld 102 (*)    All other components within normal limits  RESP PANEL BY RT-PCR (FLU A&B, COVID) [id number]    EKG EKG Interpretation  Date/Time:  Monday Sep 20 2020 16:36:54 EDT Ventricular Rate:  86 PR Interval:  136 QRS Duration: 80 QT Interval:  344 QTC Calculation: 411 R Axis:   -75 Text Interpretation: Normal sinus rhythm Left axis deviation Anterior infarct , age undetermined No significant change since last tracing Confirmed by Gwyneth Sprout ([id number]) on 09/20/2020 6:14:59 PM   Radiology DG Chest 2 View  Result Date: 09/20/2020 CLINICAL DATA:  Productive cough. EXAM: CHEST - 2 VIEW COMPARISON:  January 10, 2019 FINDINGS: The lungs are hyperinflated. Diffuse, chronic appearing increased interstitial lung markings are noted. There is no evidence of focal consolidation, pleural effusion or pneumothorax. The heart size and mediastinal contours are within normal limits. Degenerative changes seen within the thoracic spine. IMPRESSION: No active cardiopulmonary disease. Electronically Signed   By: Aram Candela M.D.   On: 09/20/2020 18:08    Procedures Procedures   Medications Ordered in ED Medications  levofloxacin (LEVAQUIN) IVPB 750 mg (750 mg Intravenous New Bag/Given 09/20/20 1755)  albuterol (PROVENTIL) (2.5 MG/3ML) 0.083% nebulizer solution 5 mg (5 mg Nebulization Given 09/20/20 1753)  ipratropium (ATROVENT) nebulizer solution 0.5 mg (0.5 mg Nebulization Given 09/20/20 1753)  methylPREDNISolone sodium succinate (SOLU-MEDROL) 125 mg/2 mL injection 125 mg (125 mg Intravenous Given 09/20/20 1753)  magnesium sulfate IVPB 2 g 50 mL (2 g Intravenous Bolus 09/20/20 1758)    ED Course  I have reviewed the triage vital signs and the nursing notes.  Pertinent labs & imaging results that were available during my care of the patient were reviewed by me and considered in my  medical decision making (see chart for details).    MDM Rules/Calculators/A&P                          71 year old female presenting today with URI symptoms and shortness of breath.  Patient was satting 77% at the doctor's office.  She is currently on 4 L nasal cannula with oxygen saturation between 93 and 95%.  Patient is able to speak in full sentences but appears winded.  She has diffuse wheezing and rhonchi.  Chest x-ray without acute findings.  Concern for possible COPD exacerbation although patient has never had a diagnosis of COPD.  She is also having fever chills and productive cough.  Patient given Levaquin, Solu-Medrol, magnesium albuterol and Atrovent.  Will recheck.  Labs are reassuring with White count of 10, hemoglobin of 17, normal CMP and anion gap.  EKG without acute findings.  Suspect patient will need admission due to hypoxia and most likely COPD exacerbation.  Low suspicion for PE at this time.  Patient has no symptoms to suggest CHF.  6:59 PM On re-evaluation pt is moving air better but now wheezing more.  Will give another treatment.  8:28 PM Wheezing diffusely.  Started on an hour-long continuous neb.  Still requiring 5 L of oxygen to maintain sats about 91%.  However work of breathing is significantly improved.  Will admit for further care.  MDM Number of Diagnoses or Management Options   Amount and/or Complexity of Data Reviewed Clinical lab tests: ordered and reviewed Tests in the radiology section of CPT: ordered and reviewed Tests in the medicine section of CPT: ordered and reviewed Independent visualization of images, tracings, or specimens: yes   CRITICAL CARE Performed by: Lindamarie Maclachlan Total critical care time: 30 minutes Critical care time was exclusive of separately billable procedures and treating other patients. Critical care was necessary to treat or  prevent imminent or life-threatening deterioration. Critical care was time spent personally by me  on the following activities: development of treatment plan with patient and/or surrogate as well as nursing, discussions with consultants, evaluation of patient's response to treatment, examination of patient, obtaining history from patient or surrogate, ordering and performing treatments and interventions, ordering and review of laboratory studies, ordering and review of radiographic studies, pulse oximetry and re-evaluation of patient's condition.  Final Clinical Impression(s) / ED Diagnoses Final diagnoses:  COPD exacerbation (HCC)  Bronchitis  Acute respiratory failure with hypoxia [facility])    Rx / DC Orders ED Discharge Orders    None       Gwyneth Sprout, MD 09/20/20 2029

## 2020-09-21 LAB — CBC
HCT: 49.2 % — ABNORMAL HIGH (ref 36.0–46.0)
Hemoglobin: 15.8 g/dL — ABNORMAL HIGH (ref 12.0–15.0)
MCH: 32.9 pg (ref 26.0–34.0)
MCHC: 32.1 g/dL (ref 30.0–36.0)
MCV: 102.5 fL — ABNORMAL HIGH (ref 80.0–100.0)
Platelets: 167 10*3/uL (ref 150–400)
RBC: 4.8 MIL/uL (ref 3.87–5.11)
RDW: 13.6 % (ref 11.5–15.5)
WBC: 9.1 10*3/uL (ref 4.0–10.5)
nRBC: 0 % (ref 0.0–0.2)

## 2020-09-21 LAB — BASIC METABOLIC PANEL
Anion gap: 10 (ref 5–15)
BUN: 15 mg/dL (ref 8–23)
CO2: 28 mmol/L (ref 22–32)
Calcium: 9.1 mg/dL (ref 8.9–10.3)
Chloride: 99 mmol/L (ref 98–111)
Creatinine, Ser: 0.69 mg/dL (ref 0.44–1.00)
GFR, Estimated: >60 mL/min (ref >60)
Glucose, Bld: 146 mg/dL — ABNORMAL HIGH (ref 70–99)
Potassium: 4.3 mmol/L (ref 3.5–5.1)
Sodium: 137 mmol/L (ref 135–145)

## 2020-09-21 LAB — HIV ANTIBODY (ROUTINE TESTING W REFLEX): HIV Screen 4th Generation wRfx: NONREACTIVE

## 2020-09-21 MED ORDER — DOXYCYCLINE HYCLATE 100 MG PO TABS
100.0000 mg | ORAL_TABLET | Freq: Two times a day (BID) | ORAL | Status: DC
  Administered 2020-09-22 – 2020-09-23 (×3): 100 mg via ORAL
  Filled 2020-09-21 (×3): qty 1

## 2020-09-21 MED ORDER — LEVOFLOXACIN 750 MG PO TABS
750.0000 mg | ORAL_TABLET | Freq: Every day | ORAL | Status: DC
  Administered 2020-09-21: 750 mg via ORAL
  Filled 2020-09-21: qty 1

## 2020-09-21 MED ORDER — METHYLPREDNISOLONE SODIUM SUCC 40 MG IJ SOLR
40.0000 mg | Freq: Two times a day (BID) | INTRAMUSCULAR | Status: DC
  Administered 2020-09-21 – 2020-09-23 (×4): 40 mg via INTRAVENOUS
  Filled 2020-09-21 (×4): qty 1

## 2020-09-21 MED ORDER — IPRATROPIUM-ALBUTEROL 0.5-2.5 (3) MG/3ML IN SOLN: 3.0000 mL | RESPIRATORY_TRACT | Status: DC | PRN

## 2020-09-21 NOTE — Progress Notes (Signed)
PROGRESS NOTE    Melody White  NWG:[id number] DOB: [birth year] DOA: 09/20/2020 PCP: Richmond Campbell., PA-C    Brief Narrative:  Mrs. Melody White was admitted to the hospital with the working diagnosis of acute hypoxic respiratory failure secondary to COPD exacerbation.  70 year old female with past medical history for CVA, hypertension, hypothyroidism and dyslipidemia.  Reported 3 days of worsening productive cough, dyspnea on exertion and generalized weakness.  Symptoms started with sinus congestion and sinus pressure.  She was seen at the urgent clinic where she was found to have an oxygenation of 77% on room air and was referred to the hospital.  On her initial physical examination blood pressure 123/69, heart rate 84, respiratory rate 16, oxygenation 88%, lungs with diffuse rhonchi, no wheezing, heart S1-S2, present, rhythmic, soft abdomen, no lower extremity edema.  Sodium 136, potassium 4.1, chloride 97, bicarb 32, glucose 102, BUN 12, creatinine 0.67, white count 10.9, hemoglobin 17.2, hematocrit 52.1, platelets 191 SARS COVID-19 negative.  Chest radiograph with significant hyperinflation.  EKG 86 bpm, left axis deviation, normal intervals, sinus rhythm, no significant ST segment or T wave changes.  Assessment & Plan:   Active Problems:   COPD exacerbation (HCC)   Acute respiratory failure with hypoxia (HCC)   Tobacco use   History of CVA (cerebrovascular accident)   HTN (hypertension)   Hypothyroidism   HLD (hyperlipidemia)   1. Acute hypoxemic respiratory failure due to COPD exacerbation. Patient with long history of tobacco abuse, currently continue smoking, at home she has inhaler that uses about to once daily at baseline. Chest film with significant hyperinflation.  Reactive leukocytosis with no signs of bacterial infection.   Continue aggressive bronchodilator therapy and systemic corticosteroids.  Inhaled corticosteroids and airway clearing techniques with flutter valve  and incentive spirometer.  Doxyxycline for airway inflammation daily for 5 doses. (patient allergic to azithromycin).   Out of bed to chair tid with meals, PT and OT evaluation. Consult nutrition.  Continue oxymetry monitoring and supplemental 02 per [place].  2. HTN/ dyslipidemia. Continue blood pressure control with lisinopril.  Continue with statin therapy.   3. Hypothyroid. Continue levothyroxine.   4. Tobacco abuse. Continue smoking cessation counseling.   5, Transitory urinary retention/ irritable bladder. Noted urinary retention on bladder scan, self resolved. Continue monitoring with bladder scan as needed.  As needed diazepam.   6. Depression. Continue with amitriptyline and escitalopram..    Patient continue to be at high risk for worsening hypoxemia   Status is: Inpatient  Remains inpatient appropriate because:IV treatments appropriate due to intensity of illness or inability to take PO   Dispo: The patient is from: Home              Anticipated d/c is to: Home              Patient currently is not medically stable to d/c.   Difficult to place patient No   DVT prophylaxis: Enoxaparin   Code Status:   full Family Communication:  No family at the bedside     Antimicrobials:   Azithromycin    Subjective: Patient continue to have dyspnea, not yet back to baseline, no chest pain, no nausea or vomiting.   Objective: Vitals:   09/21/20 0724 09/21/20 0727 09/21/20 0728 09/21/20 1328  BP:      Pulse:      Resp:      Temp:      TempSrc:      SpO2: 95% 95% 95% 96%  Weight:      Height:       No intake or output data in the 24 hours ending 09/21/20 1353 Filed Weights   09/20/20 1610 09/20/20 2245  Weight: 54.4 kg 55.2 kg    Examination:   General: Not in pain or dyspnea, deconditioned  Neurology: Awake and alert, non focal  E ENT: mild pallor, no icterus, oral mucosa moist Cardiovascular: No JVD. S1-S2 present, rhythmic, no gallops, rubs, or murmurs. No  lower extremity edema. Pulmonary: positive breath sounds bilaterally, decreased air movement, no wheezing, rhonchi or rales. Gastrointestinal. Abdomen soft and non tender Skin. No rashes Musculoskeletal: no joint deformities     Data Reviewed: I have personally reviewed following labs and imaging studies  CBC: Recent Labs  Lab 09/20/20 1646 09/21/20 0555  WBC 10.9* 9.1  NEUTROABS 8.3*  --   HGB 17.2* 15.8*  HCT 52.1* 49.2*  MCV 100.4* 102.5*  PLT 191 167   Basic Metabolic Panel: Recent Labs  Lab 09/20/20 1646 09/21/20 0555  NA 136 137  K 4.1 4.3  CL 97* 99  CO2 32 28  GLUCOSE 102* 146*  BUN 12 15  CREATININE 0.67 0.69  CALCIUM 9.5 9.1   GFR: Estimated Creatinine Clearance: 58.7 mL/min (by C-G formula based on SCr of 0.69 mg/dL). Liver Function Tests: Recent Labs  Lab 09/20/20 1646  AST 27  ALT 14  ALKPHOS 76  BILITOT 0.5  PROT 7.1  ALBUMIN 3.6   No results for input(s): LIPASE, AMYLASE in the last 168 hours. No results for input(s): AMMONIA in the last 168 hours. Coagulation Profile: No results for input(s): INR, PROTIME in the last 168 hours. Cardiac Enzymes: No results for input(s): CKTOTAL, CKMB, CKMBINDEX, TROPONINI in the last 168 hours. BNP (last 3 results) No results for input(s): PROBNP in the last 8760 hours. HbA1C: No results for input(s): HGBA1C in the last 72 hours. CBG: No results for input(s): GLUCAP in the last 168 hours. Lipid Profile: No results for input(s): CHOL, HDL, LDLCALC, TRIG, CHOLHDL, LDLDIRECT in the last 72 hours. Thyroid Function Tests: No results for input(s): TSH, T4TOTAL, FREET4, T3FREE, THYROIDAB in the last 72 hours. Anemia Panel: No results for input(s): VITAMINB12, FOLATE, FERRITIN, TIBC, IRON, RETICCTPCT in the last 72 hours.    Radiology Studies: I have reviewed all of the imaging during this hospital visit personally     Scheduled Meds: . amitriptyline  25 mg Oral QHS  . vitamin C  1,000 mg Oral  Daily  . aspirin EC  81 mg Oral Daily  . enoxaparin (LOVENOX) injection  40 mg Subcutaneous Q24H  . escitalopram  20 mg Oral Daily  . ipratropium-albuterol  3 mL Nebulization Q6H  . levofloxacin  750 mg Oral q1800  . levothyroxine  25 mcg Oral Q0600  . lisinopril  10 mg Oral Daily  . loratadine  10 mg Oral Daily  . methylPREDNISolone (SOLU-MEDROL) injection  40 mg Intravenous Daily  . mometasone-formoterol  2 puff Inhalation BID  . montelukast  10 mg Oral QHS  . rosuvastatin  10 mg Oral Daily   Continuous Infusions:   LOS: 1 day        Aubriee Szeto Annett Gula, MD

## 2020-09-21 NOTE — Progress Notes (Signed)
PHARMACIST - PHYSICIAN COMMUNICATION  CONCERNING: Antibiotic IV to Oral Route Change Policy  RECOMMENDATION: This patient is receiving levofloxacin by the intravenous route.  Based on criteria approved by the Pharmacy and Therapeutics Committee, the antibiotic(s) is/are being converted to the equivalent oral dose form(s).   DESCRIPTION: These criteria include: Patient being treated for a respiratory tract infection, urinary tract infection, cellulitis or clostridium difficile associated diarrhea if on metronidazole The patient is not neutropenic and does not exhibit a GI malabsorption state The patient is eating (either orally or via tube) and/or has been taking other orally administered medications for a least 24 hours The patient is improving clinically and has a Tmax < 100.5  If you have questions about this conversion, please contact the Pharmacy Department  []  ( [phone] )  [facility] []  ( [phone]  [facility] []  ( [phone]  [place] []  ( [phone]  [facility] [x]  ( [phone] )  [facility]   

## 2020-09-22 MED ORDER — ADULT MULTIVITAMIN W/MINERALS CH
1.0000 | ORAL_TABLET | Freq: Every day | ORAL | Status: DC
  Administered 2020-09-22: 1 via ORAL
  Filled 2020-09-22: qty 1

## 2020-09-22 MED ORDER — IPRATROPIUM-ALBUTEROL 0.5-2.5 (3) MG/3ML IN SOLN
3.0000 mL | Freq: Three times a day (TID) | RESPIRATORY_TRACT | Status: DC
  Administered 2020-09-22 – 2020-09-23 (×2): 3 mL via RESPIRATORY_TRACT
  Filled 2020-09-22 (×3): qty 3

## 2020-09-22 NOTE — Progress Notes (Signed)
Initial Nutrition Assessment  DOCUMENTATION CODES:   Non-severe (moderate) malnutrition in context of chronic illness  INTERVENTION:   -Magic cup BID with meals, each supplement provides 290 kcal and 9 grams of protein  -Multivitamin with minerals daily  NUTRITION DIAGNOSIS:   Moderate Malnutrition related to chronic illness (COPD) as evidenced by moderate fat depletion,moderate muscle depletion.  GOAL:   Patient will meet greater than or equal to 90% of their needs  MONITOR:   PO intake,Supplement acceptance,Labs,Weight trends,I & O's  REASON FOR ASSESSMENT:   Consult Assessment of nutrition requirement/status  ASSESSMENT:   70 year old female with past medical history for CVA, hypertension, hypothyroidism and dyslipidemia.  Reported 3 days of worsening productive cough, dyspnea on exertion and generalized weakness.  Symptoms started with sinus congestion and sinus pressure. Admitted to the hospital with the working diagnosis of acute hypoxic respiratory failure secondary to COPD exacerbation.  Patient in room with no family at bedside. Pt reports decreased appetite since undergoing treatment for cancer ~10 years ago.  Lunch tray in room and pt consumed 75% of her spaghetti with broccoli. Side items were not eaten. Pt eats smaller amounts. 75% of breakfast was consumed this morning. Does not prefer supplements but RD encouraged her to request them from the unit if desired. Will order Magic cups with meals.  Pt denies chewing issues but states she sometimes has instances of food feeling stuck and it will come back up occasionally.  Pt reports losing a lot of weight ~10 years ago. UBW now is ~120 lbs.  Medications: Vitamin C  Labs reviewed.  NUTRITION - FOCUSED PHYSICAL EXAM:  Flowsheet Row Most Recent Value  Orbital Region Mild depletion  Upper Arm Region Moderate depletion  Thoracic and Lumbar Region Unable to assess  Buccal Region Mild depletion  Temple Region  Mild depletion  Clavicle Bone Region Moderate depletion  Clavicle and Acromion Bone Region Moderate depletion  Scapular Bone Region Unable to assess  Dorsal Hand Moderate depletion  Patellar Region Unable to assess  Anterior Thigh Region Unable to assess  Posterior Calf Region Unable to assess  Edema (RD Assessment) None  Hair Reviewed  Eyes Reviewed  Mouth Reviewed       Diet Order:   Diet Order            Diet Heart Room service appropriate? Yes; Fluid consistency: Thin  Diet effective now                 EDUCATION NEEDS:   Education needs have been addressed  Skin:  Skin Assessment: Reviewed RN Assessment  Last BM:  5/9  Height:   Ht Readings from Last 1 Encounters:  09/20/20 5\' 6"  (1.676 m)    Weight:   Wt Readings from Last 1 Encounters:  09/20/20 55.2 kg    BMI:  Body mass index is 19.64 kg/m.  Estimated Nutritional Needs:   Kcal:  1400-1600  Protein:  70-80g  Fluid:  1.6L/day   11/20/20, MS, RD, LDN Inpatient Clinical Dietitian Contact information available via Amion

## 2020-09-22 NOTE — Progress Notes (Addendum)
PROGRESS NOTE    Melody White  BWI:[id number] DOB: 10-26-1950 DOA: 09/20/2020 PCP: Melody White., PA-C    Brief Narrative:  Melody White was admitted to the hospital with the working diagnosis of acute hypoxic respiratory failure secondary to COPD exacerbation.  70 year old female with past medical history for CVA, hypertension, hypothyroidism and dyslipidemia.  Reported 3 days of worsening productive cough, dyspnea on exertion and generalized weakness.  Symptoms started with sinus congestion and sinus pressure.  She was seen at the urgent clinic where she was found to have an oxygenation of 77% on room air and was referred to the hospital.  On her initial physical examination blood pressure 123/69, heart rate 84, respiratory rate 16, oxygenation 88%, lungs with diffuse rhonchi, no wheezing, heart S1-S2, present, rhythmic, soft abdomen, no lower extremity edema.  Sodium 136, potassium 4.1, chloride 97, bicarb 32, glucose 102, BUN 12, creatinine 0.67, white count 10.9, hemoglobin 17.2, hematocrit 52.1, platelets 191 SARS COVID-19 negative.  Chest radiograph with significant hyperinflation.  EKG 86 bpm, left axis deviation, normal intervals, sinus rhythm, no significant ST segment or T wave changes.   Assessment & Plan:   Active Problems:   COPD exacerbation (HCC)   Acute respiratory failure with hypoxia (HCC)   Tobacco use   History of CVA (cerebrovascular accident)   HTN (hypertension)   Hypothyroidism   HLD (hyperlipidemia)    1. Acute hypoxemic respiratory failure due to COPD exacerbation. Patient with long history of tobacco abuse, currently continue smoking, at home she has inhaler that uses about to once daily at baseline. Chest film with significant hyperinflation.  Reactive leukocytosis with no signs of bacterial infection.   Oxygenation is 90% on 3 L/min per [place]. Dyspnea slowly improving but not yet back to baseline, continue with aggressive bronchodilator  therapy and systemic corticosteroids, methylprednisolone 40 mg IV q12 hrs.   Continue with inhaled corticosteroids and airway clearing techniques with flutter valve and incentive spirometer.  Doxyxycline for airway inflammation daily for 5 doses. (patient allergic to azithromycin).   Continue to encourage patient to be out of bed to chair tid with meals. Positive oxygen desaturation on ambulation on room air down to 79%, will need home 02.   2. HTN/ dyslipidemia. On lisinopril for blood pressure control.  On statin therapy.  Ok to discontinue telemetry.   3. Hypothyroid. On levothyroxine.   4. Tobacco abuse. Smoking cessation counseling.   5, Transitory urinary retention/ irritable bladder.  No further urinary retentions, continue with as needed diazepam.   6. Depression. On amitriptyline and escitalopram..     Status is: Inpatient  Remains inpatient appropriate because:IV treatments appropriate due to intensity of illness or inability to take PO   Dispo: The patient is from: Home              Anticipated d/c is to: Home              Patient currently is not medically stable to d/c.   Difficult to place patient No   DVT prophylaxis: Enoxaparin   Code Status:   full  Family Communication:  I spoke with patient's sister at the bedside, we talked in detail about patient's condition, plan of care and prognosis and all questions were addressed.    Antimicrobials:   Doxycycline     Subjective: Patient with improvement in dyspnea, not yet back to baseline, no nausea or vomiting, no further urinary retention   Objective: Vitals:   09/21/20 2108 09/22/20 0117 09/22/20  0612 09/22/20 0715  BP: 109/74  118/75   Pulse: 81  74   Resp: 14  16   Temp: 97.9 F (36.6 C)  98.2 F (36.8 C)   TempSrc: Oral  Oral   SpO2: 92% 92% 92% 90%  Weight:      Height:        Intake/Output Summary (Last 24 hours) at 09/22/2020 1106 Last data filed at 09/22/2020 0900 Gross per 24  hour  Intake 480 ml  Output 2100 ml  Net -1620 ml   Filed Weights   09/20/20 1610 09/20/20 2245  Weight: 54.4 kg 55.2 kg    Examination:   General: Not in pain or dyspnea  Neurology: Awake and alert, non focal  E ENT: no pallor, no icterus, oral mucosa moist Cardiovascular: No JVD. S1-S2 present, rhythmic, no gallops, rubs, or murmurs. No lower extremity edema. Pulmonary: positive breath sounds bilaterally, decreased air movement, with no wheezing, rhonchi or rales. Positive air trapping, prolonged expiratory phase.  Gastrointestinal. Abdomen soft and non tender Skin. No rashes Musculoskeletal: no joint deformities     Data Reviewed: I have personally reviewed following labs and imaging studies  CBC: Recent Labs  Lab 09/20/20 1646 09/21/20 0555  WBC 10.9* 9.1  NEUTROABS 8.3*  --   HGB 17.2* 15.8*  HCT 52.1* 49.2*  MCV 100.4* 102.5*  PLT 191 167   Basic Metabolic Panel: Recent Labs  Lab 09/20/20 1646 09/21/20 0555  NA 136 137  K 4.1 4.3  CL 97* 99  CO2 32 28  GLUCOSE 102* 146*  BUN 12 15  CREATININE 0.67 0.69  CALCIUM 9.5 9.1   GFR: Estimated Creatinine Clearance: 58.7 mL/min (by C-G formula based on SCr of 0.69 mg/dL). Liver Function Tests: Recent Labs  Lab 09/20/20 1646  AST 27  ALT 14  ALKPHOS 76  BILITOT 0.5  PROT 7.1  ALBUMIN 3.6   No results for input(s): LIPASE, AMYLASE in the last 168 hours. No results for input(s): AMMONIA in the last 168 hours. Coagulation Profile: No results for input(s): INR, PROTIME in the last 168 hours. Cardiac Enzymes: No results for input(s): CKTOTAL, CKMB, CKMBINDEX, TROPONINI in the last 168 hours. BNP (last 3 results) No results for input(s): PROBNP in the last 8760 hours. HbA1C: No results for input(s): HGBA1C in the last 72 hours. CBG: No results for input(s): GLUCAP in the last 168 hours. Lipid Profile: No results for input(s): CHOL, HDL, LDLCALC, TRIG, CHOLHDL, LDLDIRECT in the last 72  hours. Thyroid Function Tests: No results for input(s): TSH, T4TOTAL, FREET4, T3FREE, THYROIDAB in the last 72 hours. Anemia Panel: No results for input(s): VITAMINB12, FOLATE, FERRITIN, TIBC, IRON, RETICCTPCT in the last 72 hours.    Radiology Studies: I have reviewed all of the imaging during this hospital visit personally     Scheduled Meds: . amitriptyline  25 mg Oral QHS  . vitamin C  1,000 mg Oral Daily  . aspirin EC  81 mg Oral Daily  . doxycycline  100 mg Oral Q12H  . enoxaparin (LOVENOX) injection  40 mg Subcutaneous Q24H  . escitalopram  20 mg Oral Daily  . ipratropium-albuterol  3 mL Nebulization Q6H  . levothyroxine  25 mcg Oral Q0600  . lisinopril  10 mg Oral Daily  . loratadine  10 mg Oral Daily  . methylPREDNISolone (SOLU-MEDROL) injection  40 mg Intravenous Q12H  . mometasone-formoterol  2 puff Inhalation BID  . montelukast  10 mg Oral QHS  . rosuvastatin  10 mg Oral Daily   Continuous Infusions:   LOS: 2 days        Lekesha Claw Annett Gula, MD

## 2020-09-22 NOTE — Evaluation (Signed)
Physical Therapy Evaluation Patient Details Name: Melody White MRN: [id number] DOB: 1950/05/26 Today's Date: 09/22/2020   SATURATION QUALIFICATIONS: (This note is used to comply with regulatory documentation for home oxygen)  Patient Saturations on Room Air at Rest = 89%  Patient Saturations on Room Air while Ambulating = 79%  Patient Saturations on 3 Liters of oxygen while Ambulating = 85%      History of Present Illness  70 yo female admitted to the hospital with the working diagnosis of acute hypoxic respiratory failure secondary to COPD exacerbation.. 70 year old female with past medical history for CVA, hypertension, hypothyroidism and dyslipidemia.  Reported 3 days of worsening productive cough, dyspnea on exertion and generalized weakness.    Clinical Impression  On eval, pt was Supv level for mobility. She walked ~100 feet while maneuvering with dynamap/IV pole. No LOB noted during session. Will plan to follow during hospital stay. Do not anticipate any f/u PT needs after discharge.     Follow Up Recommendations No PT follow up    Equipment Recommendations  None recommended by PT    Recommendations for Other Services       Precautions / Restrictions Precautions Precautions: Other (comment) Precaution Comments: monitor o2 sats Restrictions Weight Bearing Restrictions: No      Mobility  Bed Mobility Overal bed mobility: Independent             General bed mobility comments: oob in recliner    Transfers Overall transfer level: Modified independent                  Ambulation/Gait Ambulation/Gait assistance: Supervision Gait Distance (Feet): 100 Feet Assistive device: IV Pole Gait Pattern/deviations: Decreased stride length     General Gait Details: Supv for safety. See comments section for O2 readings. No LOB.  Stairs            Wheelchair Mobility    Modified Rankin (Stroke Patients Only)       Balance Overall balance  assessment: Mild deficits observed, not formally tested                                           Pertinent Vitals/Pain Pain Assessment: No/denies pain    Home Living Family/patient expects to be discharged to:: Private residence Living Arrangements: Spouse/significant other Available Help at Discharge: Family Type of Home: House Home Access: Stairs to enter Entrance Stairs-Rails: None Secretary/administrator of Steps: 2 Home Layout: One level Home Equipment: Environmental consultant - 2 wheels      Prior Function Level of Independence: Independent               Hand Dominance   Dominant Hand: Right    Extremity/Trunk Assessment   Upper Extremity Assessment Upper Extremity Assessment: Defer to OT evaluation    Lower Extremity Assessment Lower Extremity Assessment: Overall WFL for tasks assessed    Cervical / Trunk Assessment Cervical / Trunk Assessment: Normal  Communication   Communication: No difficulties  Cognition Arousal/Alertness: Awake/alert Behavior During Therapy: WFL for tasks assessed/performed Overall Cognitive Status: Within Functional Limits for tasks assessed                                        General Comments      Exercises     Assessment/Plan  PT Assessment Patient needs continued PT services  PT Problem List Decreased mobility;Decreased activity tolerance       PT Treatment Interventions Gait training;Therapeutic exercise;Balance training;Therapeutic activities;Patient/family education;Functional mobility training    PT Goals (Current goals can be found in the Care Plan section)  Acute Rehab PT Goals Patient Stated Goal: none stated PT Goal Formulation: With patient Time For Goal Achievement: 10/06/20 Potential to Achieve Goals: Good    Frequency Min 3X/week   Barriers to discharge        Co-evaluation               AM-PAC PT "6 Clicks" Mobility  Outcome Measure Help needed turning from  your back to your side while in a flat bed without using bedrails?: None Help needed moving from lying on your back to sitting on the side of a flat bed without using bedrails?: None Help needed moving to and from a bed to a chair (including a wheelchair)?: None Help needed standing up from a chair using your arms (e.g., wheelchair or bedside chair)?: None Help needed to walk in hospital room?: A Little Help needed climbing 3-5 steps with a railing? : A Little 6 Click Score: 22    End of Session Equipment Utilized During Treatment: Oxygen Activity Tolerance: Patient tolerated treatment well Patient left: in chair;with call bell/phone within reach   PT Visit Diagnosis: Difficulty in walking, not elsewhere classified (R26.2)    Time: 4765-4650 PT Time Calculation (min) (ACUTE ONLY): 23 min   Charges:   PT Evaluation $PT Eval Moderate Complexity: 1 Mod PT Treatments $Gait Training: 8-22 mins           Faye Ramsay, PT Acute Rehabilitation  Office: [phone] Pager: [phone]

## 2020-09-22 NOTE — Evaluation (Signed)
Occupational Therapy Evaluation Patient Details Name: Melody White MRN: [id number] DOB: [birth year] Today's Date: 09/22/2020    History of Present Illness Melody White was admitted to the hospital with the working diagnosis of acute hypoxic respiratory failure secondary to COPD exacerbation.. 70 year old female with past medical history for CVA, hypertension, hypothyroidism and dyslipidemia.  Reported 3 days of worsening productive cough, dyspnea on exertion and generalized weakness.   Clinical Impression   Melody White is a 70 year old woman who is independent at home with her husband and reports not using oxygen at home. On evaluation patient demonstrates functional strength, ability to perform ADLs and ambulation without a device. Patient found on 3 L [place] extended tubing. Patient's o2 sat dropped to 85% with approx 25 feet of in room ambulation and took approx 2 min to return to 92%. Patient educated on breathing technique to recover - though she had no significant complaints of shortness of breath. Patient near baseline in regards to functional abilities and no OT needs at this time.     Follow Up Recommendations  No OT follow up    Equipment Recommendations  None recommended by OT    Recommendations for Other Services       Precautions / Restrictions Precautions Precautions: Other (comment) Precaution Comments: monitor o2 sats Restrictions Weight Bearing Restrictions: No      Mobility Bed Mobility Overal bed mobility: Independent                  Transfers Overall transfer level: Independent                    Balance Overall balance assessment: No apparent balance deficits (not formally assessed)                                         ADL either performed or assessed with clinical judgement   ADL Overall ADL's : Independent                                       General ADL Comments: Reports independence with ADLs  in room without nursing staff.     Vision Patient Visual Report: No change from baseline Vision Assessment?: No apparent visual deficits     Perception     Praxis      Pertinent Vitals/Pain Pain Assessment: No/denies pain     Hand Dominance Right   Extremity/Trunk Assessment Upper Extremity Assessment Upper Extremity Assessment: Overall WFL for tasks assessed   Lower Extremity Assessment Lower Extremity Assessment: Defer to PT evaluation   Cervical / Trunk Assessment Cervical / Trunk Assessment: Normal   Communication Communication Communication: No difficulties   Cognition Arousal/Alertness: Awake/alert Behavior During Therapy: WFL for tasks assessed/performed Overall Cognitive Status: Within Functional Limits for tasks assessed                                     General Comments       Exercises     Shoulder Instructions      Home Living Family/patient expects to be discharged to:: Private residence Living Arrangements: Spouse/significant other Available Help at Discharge: Family Type of Home: House Home Access: Stairs to enter [employer] of Steps:  2 Entrance Stairs-Rails: None Home Layout: One level     Bathroom Shower/Tub: Producer, television/film/video: Handicapped height     Home Equipment: Environmental consultant - 2 wheels          Prior Functioning/Environment Level of Independence: Independent                 OT Problem List:        OT Treatment/Interventions:      OT Goals(Current goals can be found in the care plan section) Acute Rehab OT Goals OT Goal Formulation: All assessment and education complete, DC therapy  OT Frequency:     Barriers to D/C:            Co-evaluation              AM-PAC OT "6 Clicks" Daily Activity     Outcome Measure Help from another person eating meals?: None Help from another person taking care of personal grooming?: None Help from another person toileting, which  includes using toliet, bedpan, or urinal?: None Help from another person bathing (including washing, rinsing, drying)?: None Help from another person to put on and taking off regular upper body clothing?: None Help from another person to put on and taking off regular lower body clothing?: None 6 Click Score: 24   End of Session Nurse Communication: Mobility status  Activity Tolerance: Patient tolerated treatment well Patient left: in chair  OT Visit Diagnosis: Muscle weakness (generalized) (M62.81)                Time: 2831-5176 OT Time Calculation (min): 15 min Charges:  OT General Charges $OT Visit: 1 Visit OT Evaluation $OT Eval Low Complexity: 1 Low    Amado Andal, OTR/L Acute Care Rehab Services  Office [phone] Pager: [phone]   Kelli Churn 09/22/2020, 10:17 AM

## 2020-09-23 DIAGNOSIS — E440 Moderate protein-calorie malnutrition: Secondary | ICD-10-CM | POA: Insufficient documentation

## 2020-09-23 MED ORDER — DOXYCYCLINE HYCLATE 100 MG PO TABS: 100.0000 mg | ORAL_TABLET | Freq: Two times a day (BID) | ORAL | 0 refills | Status: DC

## 2020-09-23 MED ORDER — METHYLPREDNISOLONE 4 MG PO TBPK: ORAL_TABLET | ORAL | 0 refills | Status: DC

## 2020-09-23 NOTE — Discharge Instructions (Signed)
Advised to follow-up with primary care physician in 1 week.   Advised to take Medrol pack as directed. Advised to take doxycyclin twice a day for 1 day. Patient is being discharged home on oxygen 3 L/min.

## 2020-09-23 NOTE — Discharge Summary (Addendum)
Physician Discharge Summary  Thomes CakeLinda F Haberland ZOX:[id number] DOB: November 11, 1950 DOA: 09/20/2020  PCP: Richmond CampbellKaplan, Kristen W., PA-C  Admit date: 09/20/2020   Discharge date: 09/23/2020  Admitted From: Home.  Disposition: Home  Recommendations for Outpatient Follow-up:  1. Follow up with PCP in 1-2 weeks. 2. Please obtain BMP/CBC in one week. 3. Patient is being discharged home on 3 L of supplemental oxygen. 4. Advised to take doxycycline twice a day for 1 more day.  Home Health: None Equipment/Devices:Oxygen @ 3-4 L/ M  Discharge Condition: Stable CODE STATUS:Full code Diet recommendation: Heart Healthy   Brief Summary: This 70 year old female with PMH significant for CVA, hypertension, hypothyroidism and dyslipidemia presented with 3 days of worsening productive cough, dyspnea on exertion and generalized weakness. Her symptoms started with sinus congestion and sinus pressure. She was seen at the urgent clinic where she was found to have an oxygenation of 77% on room air and was referred to the hospital. On her initial physical examination blood pressure 123/69, heart rate 84, respiratory rate 16, oxygenation 88%,lungs with diffuse rhonchi, no wheezing, heart S1-S2, present, rhythmic, soft abdomen, no lower extremity edema.Chest radiograph with significant hyperinflation.  Hospital Course: Patient was admitted for acute hypoxic respiratory failure secondary to COPD exacerbation.  Patient does reports history of COPD but never been on oxygen before. She reports active tobacco use. Patient was continued on IV Solu-Medrol, scheduled and as needed nebulizations with DuoNeb.  Patient has been slowly improving.  Patient felt significantly better after 3 days of IV steroids.  Patient has ambulated with O2 saturation 91% on 4L /Min. Next day  Patient feels better and want to be discharged.  Patient is being discharged home on 3-4 L of supplemental oxygen, prednisone taper, and inhalers.  She was managed  for below problems.  Discharge Diagnoses:  Active Problems:   COPD exacerbation (HCC)   Acute respiratory failure with hypoxia (HCC)   Tobacco use   History of CVA (cerebrovascular accident)   HTN (hypertension)   Hypothyroidism   HLD (hyperlipidemia)   Malnutrition of moderate degree   Acute hypoxemic respiratory failure due to COPD exacerbation > Improved. Patient with long history of tobacco abuse, currently continue smoking, at home, She has inhaler that uses about to once daily at baseline. Chest film with significant hyperinflation.  Reactive leukocytosis with no signs of bacterial infection.  Oxygenation is 90% on 3 L/min per [place]. Dyspnea slowly improving  And back to baseline. Continue with aggressive bronchodilator therapy and systemic corticosteroids. Continue with inhaled corticosteroids and airway clearing techniques with flutter valve and incentive spirometer.  Doxyxycline for airway inflammation daily for 5 doses. (patient allergic to azithromycin).  Continue to encourage patient to be out of bed to chair tid with meals. Positive oxygen desaturation on ambulation on room air down to 79%, will need home 02.   HTN/ dyslipidemia.  On lisinopril for blood pressure control.  On statin therapy.  Ok to discontinue telemetry.   Hypothyroid. On levothyroxine.   Tobacco abuse. Smoking cessation counseling.   Transitory urinary retention/ irritable bladder.   No further urinary retention, continue with as needed diazepam.   Depression. On amitriptyline and escitalopram..  Discharge Instructions  Discharge Instructions    Call MD for:  difficulty breathing, headache or visual disturbances   Complete by: As directed    Call MD for:  persistant dizziness or light-headedness   Complete by: As directed    Call MD for:  persistant nausea and vomiting   Complete by:  As directed    Diet - low sodium heart healthy   Complete by: As directed    Diet Carb Modified    Complete by: As directed    Discharge instructions   Complete by: As directed    Advised to follow-up with primary care physician in 1 week.   Advised to take Medrol pack as directed. Advised to take doxycyclin twice a day for 1 day. Patient is being discharged home on oxygen 3 L/min.   Increase activity slowly   Complete by: As directed      Allergies as of 09/23/2020      Reactions   Amoxicillin Other (See Comments)   other   Azithromycin Itching   Clindamycin Other (See Comments)   Funny feeling other   Codeine Itching, Other (See Comments)   other   Fexofenadine Other (See Comments)   Funny feeling other   Hydrocodone Bit-homatrop Mbr Itching   Sulfamethoxazole Nausea Only, Other (See Comments)   other      Medication List    TAKE these medications   amitriptyline 25 MG tablet Commonly known as: ELAVIL Take 25 mg by mouth at bedtime.   aspirin EC 81 MG tablet Take 1 tablet (81 mg total) by mouth daily.   budesonide-formoterol 160-4.5 MCG/ACT inhaler Commonly known as: SYMBICORT Inhale 2 puffs into the lungs 2 (two) times daily.   diazepam 10 MG tablet Commonly known as: VALIUM Take 10 mg by mouth at bedtime as needed for anxiety or sleep. Into the vagina every night as needed for pelvic floor spasms   doxycycline 100 MG tablet Commonly known as: VIBRA-TABS Take 1 tablet (100 mg total) by mouth every 12 (twelve) hours.   Echinacea 400 MG Caps Take 400 mg by mouth daily.   escitalopram 20 MG tablet Commonly known as: LEXAPRO Take 20 mg by mouth daily.   fluticasone 50 MCG/ACT nasal spray Commonly known as: FLONASE Place 2 sprays into the nose daily as needed for allergies.   ibuprofen 200 MG tablet Commonly known as: ADVIL Take 400 mg by mouth every 6 (six) hours as needed for mild pain.   levocetirizine 5 MG tablet Commonly known as: XYZAL Take 1 tablet by mouth daily.   levothyroxine 25 MCG tablet Commonly known as: SYNTHROID Take 25 mcg by  mouth daily.   lisinopril 10 MG tablet Commonly known as: ZESTRIL Take 10 mg by mouth daily.   methylPREDNISolone 4 MG Tbpk tablet Commonly known as: MEDROL DOSEPAK Advised to take it as directed.   montelukast 10 MG tablet Commonly known as: SINGULAIR Take 10 mg by mouth at bedtime.   multivitamin capsule Take 1 capsule by mouth daily.   multivitamin with minerals Tabs tablet Take 1 tablet by mouth daily.   rosuvastatin 10 MG tablet Commonly known as: CRESTOR Take 10 mg by mouth daily.   vitamin C 1000 MG tablet Take 1,000 mg by mouth daily.   zinc gluconate 50 MG tablet Take 50 mg by mouth daily.            Durable Medical Equipment  (From admission, onward)         Start     Ordered   09/23/20 1309  For home use only DME oxygen  Once       Question Answer Comment  Length of Need Lifetime   Mode or (Route) Nasal cannula   Liters per Minute 4   Frequency Continuous (stationary and portable oxygen unit needed)   Oxygen  conserving device Yes   Oxygen delivery system Gas      09/23/20 1308          Follow-up Information    Richmond Campbell., PA-C Follow up in 1 week(s).   Specialty: Family Medicine Contact information: [address] [place] [address] [phone]        O'Neal, Ronnald Ramp, MD .   Specialties: Cardiology, Internal Medicine, Radiology Contact information: [address] [place] [address] [phone]              Allergies  Allergen Reactions  . Amoxicillin Other (See Comments)    other   . Azithromycin Itching  . Clindamycin Other (See Comments)    Funny feeling other   . Codeine Itching and Other (See Comments)    other   . Fexofenadine Other (See Comments)    Funny feeling other   . Hydrocodone Bit-Homatrop Mbr Itching  . Sulfamethoxazole Nausea Only and Other (See Comments)    other     Consultations:  None   Procedures/Studies: DG Chest 2 View  Result Date:  09/20/2020 CLINICAL DATA:  Productive cough. EXAM: CHEST - 2 VIEW COMPARISON:  January 10, 2019 FINDINGS: The lungs are hyperinflated. Diffuse, chronic appearing increased interstitial lung markings are noted. There is no evidence of focal consolidation, pleural effusion or pneumothorax. The heart size and mediastinal contours are within normal limits. Degenerative changes seen within the thoracic spine. IMPRESSION: No active cardiopulmonary disease. Electronically Signed   By: Aram Candela M.D.   On: 09/20/2020 18:08   None   Subjective: Patient was seen and examined at bedside.  Overnight events noted.  Patient reports feeling much improved.  She wants to be discharged home.  She is on 3 L of supplemental oxygen sats 91%.  Discharge Exam: Vitals:   09/23/20 1102 09/23/20 1104  BP:    Pulse: 83 85  Resp:    Temp:    SpO2: (!) 87% (!) 86%   Vitals:   09/23/20 0601 09/23/20 0737 09/23/20 1102 09/23/20 1104  BP: 117/71     Pulse: 70  83 85  Resp: 14     Temp: 98.6 F (37 C)     TempSrc: Oral     SpO2: 91% 95% (!) 87% (!) 86%  Weight:      Height:        General: Pt is alert, awake, not in acute distress Cardiovascular: RRR, S1/S2 +, no rubs, no gallops Respiratory: CTA bilaterally, no wheezing, no rhonchi Abdominal: Soft, NT, ND, bowel sounds + Extremities: no edema, no cyanosis    The results of significant diagnostics from this hospitalization (including imaging, microbiology, ancillary and laboratory) are listed below for reference.     Microbiology: Recent Results (from the past 240 hour(s))  Resp Panel by RT-PCR (Flu A&B, Covid) Nasopharyngeal Swab     Status: None   Collection Time: 09/20/20  4:46 PM   Specimen: Nasopharyngeal Swab; Nasopharyngeal(NP) swabs in vial transport medium  Result Value Ref Range Status   SARS Coronavirus 2 by RT PCR NEGATIVE NEGATIVE Final    Comment: (NOTE) SARS-CoV-2 target nucleic acids are NOT DETECTED.  The SARS-CoV-2 RNA is  generally detectable in upper respiratory specimens during the acute phase of infection. The lowest concentration of SARS-CoV-2 viral copies this assay can detect is 138 copies/mL. A negative result does not preclude SARS-Cov-2 infection and should not be used as the sole basis for treatment or other patient management decisions. A  negative result may occur with  improper specimen collection/handling, submission of specimen other than nasopharyngeal swab, presence of viral mutation(s) within the areas targeted by this assay, and inadequate number of viral copies(<138 copies/mL). A negative result must be combined with clinical observations, patient history, and epidemiological information. The expected result is Negative.  Fact Sheet for Patients:  BloggerCourse.com  Fact Sheet for Healthcare Providers:  SeriousBroker.it  This test is no t yet approved or cleared by the [place] FDA and  has been authorized for detection and/or diagnosis of SARS-CoV-2 by FDA under an Emergency Use Authorization (EUA). This EUA will remain  in effect (meaning this test can be used) for the duration of the COVID-19 declaration under Section 564(b)(1) of the Act, 21 U.S.C.section 360bbb-3(b)(1), unless the authorization is terminated  or revoked sooner.       Influenza A by PCR NEGATIVE NEGATIVE Final   Influenza B by PCR NEGATIVE NEGATIVE Final    Comment: (NOTE) The Xpert Xpress SARS-CoV-2/FLU/RSV plus assay is intended as an aid in the diagnosis of influenza from Nasopharyngeal swab specimens and should not be used as a sole basis for treatment. Nasal washings and aspirates are unacceptable for Xpert Xpress SARS-CoV-2/FLU/RSV testing.  Fact Sheet for Patients: BloggerCourse.com  Fact Sheet for Healthcare Providers: SeriousBroker.it  This test is not yet approved or cleared by the [place] FDA and has been authorized for detection and/or diagnosis of SARS-CoV-2 by FDA under an Emergency Use Authorization (EUA). This EUA will remain in effect (meaning this test can be used) for the duration of the COVID-19 declaration under Section 564(b)(1) of the Act, 21 U.S.C. section 360bbb-3(b)(1), unless the authorization is terminated or revoked.  Performed at [facility], [address] [place], [place] [address]      Labs: BNP (last 3 results) No results for input(s): BNP in the last 8760 hours. Basic Metabolic Panel: Recent Labs  Lab 09/20/20 1646 09/21/20 0555  NA 136 137  K 4.1 4.3  CL 97* 99  CO2 32 28  GLUCOSE 102* 146*  BUN 12 15  CREATININE 0.67 0.69  CALCIUM 9.5 9.1   Liver Function Tests: Recent Labs  Lab 09/20/20 1646  AST 27  ALT 14  ALKPHOS 76  BILITOT 0.5  PROT 7.1  ALBUMIN 3.6   No results for input(s): LIPASE, AMYLASE in the last 168 hours. No results for input(s): AMMONIA in the last 168 hours. CBC: Recent Labs  Lab 09/20/20 1646 09/21/20 0555  WBC 10.9* 9.1  NEUTROABS 8.3*  --   HGB 17.2* 15.8*  HCT 52.1* 49.2*  MCV 100.4* 102.5*  PLT 191 167   Cardiac Enzymes: No results for input(s): CKTOTAL, CKMB, CKMBINDEX, TROPONINI in the last 168 hours. BNP: Invalid input(s): POCBNP CBG: No results for input(s): GLUCAP in the last 168 hours. D-Dimer No results for input(s): DDIMER in the last 72 hours. Hgb A1c No results for input(s): HGBA1C in the last 72 hours. Lipid Profile No results for input(s): CHOL, HDL, LDLCALC, TRIG, CHOLHDL, LDLDIRECT in the last 72 hours. Thyroid function studies No results for input(s): TSH, T4TOTAL, T3FREE, THYROIDAB in the last 72 hours.  Invalid input(s): FREET3 Anemia work up No results for input(s): VITAMINB12, FOLATE, FERRITIN, TIBC, IRON, RETICCTPCT in the last 72 hours. Urinalysis No results found for: COLORURINE, APPEARANCEUR, LABSPEC, PHURINE, GLUCOSEU, HGBUR,  BILIRUBINUR, KETONESUR, PROTEINUR, UROBILINOGEN, NITRITE, LEUKOCYTESUR Sepsis Labs Invalid input(s): PROCALCITONIN,  WBC,  LACTICIDVEN Microbiology Recent Results (from the past 240 hour(s))  Resp Panel by RT-PCR (Flu A&B, Covid) Nasopharyngeal Swab     Status: None   Collection Time: 09/20/20  4:46 PM   Specimen: Nasopharyngeal Swab; Nasopharyngeal(NP) swabs in vial transport medium  Result Value Ref Range Status   SARS Coronavirus 2 by RT PCR NEGATIVE NEGATIVE Final    Comment: (NOTE) SARS-CoV-2 target nucleic acids are NOT DETECTED.  The SARS-CoV-2 RNA is generally detectable in upper respiratory specimens during the acute phase of infection. The lowest concentration of SARS-CoV-2 viral copies this assay can detect is 138 copies/mL. A negative result does not preclude SARS-Cov-2 infection and should not be used as the sole basis for treatment or other patient management decisions. A negative result may occur with  improper specimen collection/handling, submission of specimen other than nasopharyngeal swab, presence of viral mutation(s) within the areas targeted by this assay, and inadequate number of viral copies(<138 copies/mL). A negative result must be combined with clinical observations, patient history, and epidemiological information. The expected result is Negative.  Fact Sheet for Patients:  BloggerCourse.com  Fact Sheet for Healthcare Providers:  SeriousBroker.it  This test is no t yet approved or cleared by the [place] FDA and  has been authorized for detection and/or diagnosis of SARS-CoV-2 by FDA under an Emergency Use Authorization (EUA). This EUA will remain  in effect (meaning this test can be used) for the duration of the COVID-19 declaration under Section 564(b)(1) of the Act, 21 U.S.C.section 360bbb-3(b)(1), unless the authorization is terminated  or revoked sooner.       Influenza A by PCR NEGATIVE  NEGATIVE Final   Influenza B by PCR NEGATIVE NEGATIVE Final    Comment: (NOTE) The Xpert Xpress SARS-CoV-2/FLU/RSV plus assay is intended as an aid in the diagnosis of influenza from Nasopharyngeal swab specimens and should not be used as a sole basis for treatment. Nasal washings and aspirates are unacceptable for Xpert Xpress SARS-CoV-2/FLU/RSV testing.  Fact Sheet for Patients: BloggerCourse.com  Fact Sheet for Healthcare Providers: SeriousBroker.it  This test is not yet approved or cleared by the [place] FDA and has been authorized for detection and/or diagnosis of SARS-CoV-2 by FDA under an Emergency Use Authorization (EUA). This EUA will remain in effect (meaning this test can be used) for the duration of the COVID-19 declaration under Section 564(b)(1) of the Act, 21 U.S.C. section 360bbb-3(b)(1), unless the authorization is terminated or revoked.  Performed at [facility], [address] [place], [place] [address]      Time coordinating discharge: Over 30 minutes  SIGNED:   Cipriano Bunker, MD  [facility] 09/23/2020, 1:14 PM Pager   If 7PM-7AM, please contact night-coverage [email or web address]

## 2020-09-23 NOTE — Plan of Care (Signed)
Pt reports feeling much better; was able to sleep well through the night; no s/s of acute distress or pain reported or observed; call light within reach and bed at lowest level for safety. AAOx4

## 2020-09-23 NOTE — Care Management Important Message (Signed)
Important Message  Patient Details IM Letter given to the Patient. Name: Melody White MRN: [id number] Date of Birth: 06/02/1950   Medicare Important Message Given:  Yes     Gracilyn, Gunia 09/23/2020, 11:00 AM

## 2020-09-23 NOTE — TOC Transition Note (Signed)
Transition of Care [facility]) - CM/SW Discharge Note   Patient Details  Name: Melody White MRN: [id number] Date of Birth: Jan 16, 1951  Transition of [facility] [place] [facility]) CM/SW Contact:  Bartholome Bill, RN Phone Number: 09/23/2020, 12:56 PM   Clinical Narrative:    Pt to dc with home 02. Desat screen done by nursing staff. Home 02 order received from MD. Referral called to [facility] liaison.

## 2020-09-23 NOTE — Progress Notes (Addendum)
Patient Saturations on RA at rest are 87%. Patient Saturations on RA while ambulating 86%. Patient Saturations on 3 Liters of O2 while ambulating is 87%. Patient Saturations on 3 liters of O2 at rest is 95% Pt Sats on 4 L of O2 while ambulating is 91%

## 2020-10-10 ENCOUNTER — Emergency Department (HOSPITAL_COMMUNITY): Payer: Medicare Other

## 2020-10-10 ENCOUNTER — Inpatient Hospital Stay (HOSPITAL_COMMUNITY)
Admission: EM | Admit: 2020-10-10 | Discharge: 2020-10-13 | DRG: 481 | Disposition: A | Discharge status: Home Health | Payer: Medicare Other | Attending: Family Medicine | Admitting: Family Medicine

## 2020-10-10 DIAGNOSIS — Z419 Encounter for procedure for purposes other than remedying health state, unspecified: Secondary | ICD-10-CM

## 2020-10-10 DIAGNOSIS — Z7951 Long term (current) use of inhaled steroids: Secondary | ICD-10-CM | POA: Diagnosis not present

## 2020-10-10 DIAGNOSIS — Z888 Allergy status to other drugs, medicaments and biological substances status: Secondary | ICD-10-CM

## 2020-10-10 DIAGNOSIS — D62 Acute posthemorrhagic anemia: Secondary | ICD-10-CM | POA: Diagnosis not present

## 2020-10-10 DIAGNOSIS — I10 Essential (primary) hypertension: Secondary | ICD-10-CM | POA: Diagnosis present

## 2020-10-10 DIAGNOSIS — E039 Hypothyroidism, unspecified: Secondary | ICD-10-CM | POA: Diagnosis present

## 2020-10-10 DIAGNOSIS — Z8673 Personal history of transient ischemic attack (TIA), and cerebral infarction without residual deficits: Secondary | ICD-10-CM

## 2020-10-10 DIAGNOSIS — Z20822 Contact with and (suspected) exposure to covid-19: Secondary | ICD-10-CM | POA: Diagnosis present

## 2020-10-10 DIAGNOSIS — Z7982 Long term (current) use of aspirin: Secondary | ICD-10-CM | POA: Diagnosis not present

## 2020-10-10 DIAGNOSIS — S72142A Displaced intertrochanteric fracture of left femur, initial encounter for closed fracture: Secondary | ICD-10-CM | POA: Diagnosis present

## 2020-10-10 DIAGNOSIS — Z9981 Dependence on supplemental oxygen: Secondary | ICD-10-CM | POA: Diagnosis not present

## 2020-10-10 DIAGNOSIS — J449 Chronic obstructive pulmonary disease, unspecified: Secondary | ICD-10-CM | POA: Diagnosis present

## 2020-10-10 DIAGNOSIS — F32A Depression, unspecified: Secondary | ICD-10-CM | POA: Diagnosis present

## 2020-10-10 DIAGNOSIS — Z720 Tobacco use: Secondary | ICD-10-CM | POA: Diagnosis present

## 2020-10-10 DIAGNOSIS — D7589 Other specified diseases of blood and blood-forming organs: Secondary | ICD-10-CM | POA: Diagnosis present

## 2020-10-10 DIAGNOSIS — Z885 Allergy status to narcotic agent status: Secondary | ICD-10-CM | POA: Diagnosis not present

## 2020-10-10 DIAGNOSIS — D696 Thrombocytopenia, unspecified: Secondary | ICD-10-CM | POA: Diagnosis present

## 2020-10-10 DIAGNOSIS — T148XXA Other injury of unspecified body region, initial encounter: Secondary | ICD-10-CM

## 2020-10-10 DIAGNOSIS — R739 Hyperglycemia, unspecified: Secondary | ICD-10-CM | POA: Diagnosis not present

## 2020-10-10 DIAGNOSIS — S72002A Fracture of unspecified part of neck of left femur, initial encounter for closed fracture: Secondary | ICD-10-CM | POA: Diagnosis not present

## 2020-10-10 DIAGNOSIS — N2889 Other specified disorders of kidney and ureter: Secondary | ICD-10-CM | POA: Diagnosis not present

## 2020-10-10 DIAGNOSIS — W1830XA Fall on same level, unspecified, initial encounter: Secondary | ICD-10-CM | POA: Diagnosis present

## 2020-10-10 DIAGNOSIS — Z881 Allergy status to other antibiotic agents status: Secondary | ICD-10-CM

## 2020-10-10 DIAGNOSIS — J9611 Chronic respiratory failure with hypoxia: Secondary | ICD-10-CM | POA: Diagnosis present

## 2020-10-10 DIAGNOSIS — Z79899 Other long term (current) drug therapy: Secondary | ICD-10-CM | POA: Diagnosis not present

## 2020-10-10 DIAGNOSIS — K08109 Complete loss of teeth, unspecified cause, unspecified class: Secondary | ICD-10-CM | POA: Diagnosis present

## 2020-10-10 DIAGNOSIS — E785 Hyperlipidemia, unspecified: Secondary | ICD-10-CM | POA: Diagnosis present

## 2020-10-10 DIAGNOSIS — F17210 Nicotine dependence, cigarettes, uncomplicated: Secondary | ICD-10-CM | POA: Diagnosis present

## 2020-10-10 DIAGNOSIS — W19XXXA Unspecified fall, initial encounter: Secondary | ICD-10-CM

## 2020-10-10 DIAGNOSIS — Z79890 Hormone replacement therapy (postmenopausal): Secondary | ICD-10-CM | POA: Diagnosis not present

## 2020-10-10 DIAGNOSIS — Z90710 Acquired absence of both cervix and uterus: Secondary | ICD-10-CM | POA: Diagnosis not present

## 2020-10-10 DIAGNOSIS — I151 Hypertension secondary to other renal disorders: Secondary | ICD-10-CM | POA: Diagnosis not present

## 2020-10-10 DIAGNOSIS — S72009A Fracture of unspecified part of neck of unspecified femur, initial encounter for closed fracture: Secondary | ICD-10-CM | POA: Diagnosis present

## 2020-10-10 DIAGNOSIS — Z880 Allergy status to penicillin: Secondary | ICD-10-CM | POA: Diagnosis not present

## 2020-10-10 DIAGNOSIS — J4489 Other specified chronic obstructive pulmonary disease: Secondary | ICD-10-CM | POA: Diagnosis present

## 2020-10-10 DIAGNOSIS — Z882 Allergy status to sulfonamides status: Secondary | ICD-10-CM

## 2020-10-10 LAB — BASIC METABOLIC PANEL
Anion gap: 10 (ref 5–15)
BUN: 16 mg/dL (ref 8–23)
CO2: 24 mmol/L (ref 22–32)
Calcium: 8.5 mg/dL — ABNORMAL LOW (ref 8.9–10.3)
Chloride: 103 mmol/L (ref 98–111)
Creatinine, Ser: 0.94 mg/dL (ref 0.44–1.00)
GFR, Estimated: >60 mL/min (ref >60)
Glucose, Bld: 85 mg/dL (ref 70–99)
Potassium: 3.8 mmol/L (ref 3.5–5.1)
Sodium: 137 mmol/L (ref 135–145)

## 2020-10-10 LAB — CBC WITH DIFFERENTIAL/PLATELET
Abs Immature Granulocytes: 0.03 10*3/uL (ref 0.00–0.07)
Basophils Absolute: 0 10*3/uL (ref 0.0–0.1)
Basophils Relative: 1 %
Eosinophils Absolute: 0.3 10*3/uL (ref 0.0–0.5)
Eosinophils Relative: 4 %
HCT: 42.6 % (ref 36.0–46.0)
Hemoglobin: 14.4 g/dL (ref 12.0–15.0)
Immature Granulocytes: 0 %
Lymphocytes Relative: 22 %
Lymphs Abs: 1.5 10*3/uL (ref 0.7–4.0)
MCH: 32.7 pg (ref 26.0–34.0)
MCHC: 33.8 g/dL (ref 30.0–36.0)
MCV: 96.6 fL (ref 80.0–100.0)
Monocytes Absolute: 0.6 10*3/uL (ref 0.1–1.0)
Monocytes Relative: 8 %
Neutro Abs: 4.5 10*3/uL (ref 1.7–7.7)
Neutrophils Relative %: 65 %
Platelets: 163 10*3/uL (ref 150–400)
RBC: 4.41 MIL/uL (ref 3.87–5.11)
RDW: 12.4 % (ref 11.5–15.5)
WBC: 6.9 10*3/uL (ref 4.0–10.5)
nRBC: 0 % (ref 0.0–0.2)

## 2020-10-10 LAB — PROTIME-INR
INR: 1.1 (ref 0.8–1.2)
Prothrombin Time: 13.9 seconds (ref 11.4–15.2)

## 2020-10-10 LAB — TYPE AND SCREEN
ABO/RH(D): O POS
Antibody Screen: NEGATIVE

## 2020-10-10 LAB — RESP PANEL BY RT-PCR (FLU A&B, COVID) ARPGX2
Influenza A by PCR: NEGATIVE
Influenza B by PCR: NEGATIVE
SARS Coronavirus 2 by RT PCR: NEGATIVE

## 2020-10-10 MED ORDER — MOMETASONE FURO-FORMOTEROL FUM 200-5 MCG/ACT IN AERO
2.0000 | INHALATION_SPRAY | Freq: Two times a day (BID) | RESPIRATORY_TRACT | Status: DC
  Administered 2020-10-11 – 2020-10-13 (×4): 2 via RESPIRATORY_TRACT
  Filled 2020-10-10: qty 8.8

## 2020-10-10 MED ORDER — HYDROMORPHONE HCL 1 MG/ML IJ SOLN
1.0000 mg | Freq: Once | INTRAMUSCULAR | Status: AC
Start: 2020-10-10 — End: 2020-10-10
  Administered 2020-10-10: 1 mg via INTRAVENOUS
  Filled 2020-10-10: qty 1

## 2020-10-10 MED ORDER — ESCITALOPRAM OXALATE 10 MG PO TABS
20.0000 mg | ORAL_TABLET | Freq: Every day | ORAL | Status: DC
  Administered 2020-10-11 – 2020-10-13 (×3): 20 mg via ORAL
  Filled 2020-10-10 (×4): qty 2

## 2020-10-10 MED ORDER — NICOTINE 21 MG/24HR TD PT24: 21.0000 mg | MEDICATED_PATCH | Freq: Every day | TRANSDERMAL | Status: DC | PRN

## 2020-10-10 MED ORDER — HYDROMORPHONE HCL 1 MG/ML IJ SOLN
1.0000 mg | INTRAMUSCULAR | Status: DC | PRN
  Administered 2020-10-11: 1 mg via INTRAVENOUS
  Filled 2020-10-10: qty 1

## 2020-10-10 MED ORDER — MONTELUKAST SODIUM 10 MG PO TABS
10.0000 mg | ORAL_TABLET | Freq: Every day | ORAL | Status: DC
  Administered 2020-10-10 – 2020-10-12 (×3): 10 mg via ORAL
  Filled 2020-10-10 (×3): qty 1

## 2020-10-10 MED ORDER — LEVOTHYROXINE SODIUM 50 MCG PO TABS
50.0000 ug | ORAL_TABLET | Freq: Every day | ORAL | Status: DC
  Administered 2020-10-12 – 2020-10-13 (×2): 50 ug via ORAL
  Filled 2020-10-10 (×2): qty 1

## 2020-10-10 MED ORDER — ONDANSETRON HCL 4 MG/2ML IJ SOLN: 4.0000 mg | Freq: Four times a day (QID) | INTRAMUSCULAR | Status: DC | PRN

## 2020-10-10 MED ORDER — AMITRIPTYLINE HCL 50 MG PO TABS
25.0000 mg | ORAL_TABLET | Freq: Every day | ORAL | Status: DC
  Administered 2020-10-11 – 2020-10-12 (×3): 25 mg via ORAL
  Filled 2020-10-10 (×3): qty 1

## 2020-10-10 MED ORDER — LEVOTHYROXINE SODIUM 25 MCG PO TABS: 50.0000 ug | ORAL_TABLET | Freq: Every day | ORAL | Status: DC

## 2020-10-10 MED ORDER — ACETAMINOPHEN 325 MG PO TABS
650.0000 mg | ORAL_TABLET | Freq: Four times a day (QID) | ORAL | Status: DC
  Administered 2020-10-10 – 2020-10-11 (×2): 650 mg via ORAL
  Filled 2020-10-10 (×2): qty 2

## 2020-10-10 MED ORDER — FENTANYL CITRATE (PF) 100 MCG/2ML IJ SOLN
50.0000 ug | INTRAMUSCULAR | Status: DC | PRN
  Administered 2020-10-10: 50 ug via INTRAVENOUS
  Filled 2020-10-10: qty 2

## 2020-10-10 MED ORDER — POLYETHYLENE GLYCOL 3350 17 G PO PACK
17.0000 g | PACK | Freq: Two times a day (BID) | ORAL | Status: DC
  Administered 2020-10-10 – 2020-10-13 (×5): 17 g via ORAL
  Filled 2020-10-10 (×6): qty 1

## 2020-10-10 MED ORDER — MORPHINE SULFATE (PF) 4 MG/ML IV SOLN
4.0000 mg | INTRAVENOUS | Status: DC | PRN
  Administered 2020-10-10: 4 mg via INTRAVENOUS
  Filled 2020-10-10: qty 1

## 2020-10-10 MED ORDER — ONDANSETRON HCL 4 MG/2ML IJ SOLN
4.0000 mg | Freq: Once | INTRAMUSCULAR | Status: AC
  Administered 2020-10-10: 4 mg via INTRAVENOUS
  Filled 2020-10-10: qty 2

## 2020-10-10 MED ORDER — SENNA 8.6 MG PO TABS: 1.0000 | ORAL_TABLET | Freq: Every day | ORAL | Status: DC | PRN

## 2020-10-10 MED ORDER — OXYCODONE HCL 5 MG PO TABS
5.0000 mg | ORAL_TABLET | ORAL | Status: DC | PRN
Start: 2020-10-10 — End: 2020-10-10

## 2020-10-10 MED ORDER — HEPARIN SODIUM (PORCINE) 5000 UNIT/ML IJ SOLN
5000.0000 [IU] | Freq: Once | INTRAMUSCULAR | Status: AC
  Administered 2020-10-10: 5000 [IU] via SUBCUTANEOUS
  Filled 2020-10-10: qty 1

## 2020-10-10 MED ORDER — ACETAMINOPHEN 325 MG PO TABS: 650.0000 mg | ORAL_TABLET | Freq: Four times a day (QID) | ORAL | Status: DC

## 2020-10-10 NOTE — Progress Notes (Signed)
Orthopedic Tech Progress Note Patient Details:  Melody White Apr 10, 1951 [id number]  Musculoskeletal Traction Type of Traction: Bucks Skin Traction Traction Location: lle Traction Weight: 10 lbs   Post Interventions Patient Tolerated: Well Instructions Provided: Care of device,Adjustment of device   Trinna Post 10/10/2020, 10:53 PM

## 2020-10-10 NOTE — Progress Notes (Signed)
Ortho Note  Reviewed imaging and case with Dr. Charm Barges. Patient with Left intertrochanteric femur fracture. Will require cephalomedullary nailing tomorrow. NPO after midnight. Hospitalist for admission. Bucks traction to LLE. Consult to follow in the AM.  Roby Lofts, MD [facility] [phone] (office) [email or web address]

## 2020-10-10 NOTE — ED Notes (Signed)
Returned from imaging at this time.

## 2020-10-10 NOTE — ED Provider Notes (Signed)
[facility] EMERGENCY DEPARTMENT Provider Note   CSN: [id number] Arrival date & time: 10/10/20  1724     History Chief Complaint  Patient presents with  . Fall    Melody White is a 70 y.o. female.  She has a history of COPD CVA hypertension.  She was getting into the car when she fell landing on her left hip.  Complaining of severe left hip pain.  EMS stabilize the area and gave her fentanyl in transport.  She did not strike her head or lose consciousness.  She is not on any blood thinners other than aspirin.  She denies any chest pain or shortness of breath.  No neck or back pain.  No numbness or weakness.  The history is provided by the patient.  Fall This is a new problem. The current episode started 1 to 2 hours ago. The problem has not changed since onset.Pertinent negatives include no chest pain, no abdominal pain, no headaches and no shortness of breath. The symptoms are aggravated by bending and twisting. The symptoms are relieved by position. She has tried rest for the symptoms. The treatment provided mild relief.       Past Medical History:  Diagnosis Date  . Hypertension   . Stroke (HCC)   . Thyroid disease     Patient Active Problem List   Diagnosis Date Noted  . Malnutrition of moderate degree 09/23/2020  . COPD exacerbation (HCC) 09/20/2020  . Acute respiratory failure with hypoxia (HCC) 09/20/2020  . Tobacco use 09/20/2020  . History of CVA (cerebrovascular accident) 09/20/2020  . HTN (hypertension) 09/20/2020  . Hypothyroidism 09/20/2020  . HLD (hyperlipidemia) 09/20/2020    Past Surgical History:  Procedure Laterality Date  . ABDOMINAL HYSTERECTOMY    . bowel obstruction    . BREAST EXCISIONAL BIOPSY Right 20+ yrs ago   benign  . SHOULDER SURGERY       OB History   No obstetric history on file.     Family History  Problem Relation Age of Onset  . Arthritis Mother     Social History   Tobacco Use  . Smoking status:  Current Every Day Smoker    Packs/day: 0.50    Years: 50.00    Pack years: 25.00    Types: Cigarettes  . Smokeless tobacco: Never Used  Substance Use Topics  . Alcohol use: Not Currently  . Drug use: Not Currently    Home Medications Prior to Admission medications   Medication Sig Start Date End Date Taking? Authorizing Provider  amitriptyline (ELAVIL) 25 MG tablet Take 25 mg by mouth at bedtime. 11/29/18 09/20/20  [provider]  Ascorbic Acid (VITAMIN C) 1000 MG tablet Take 1,000 mg by mouth daily.    [provider]  aspirin EC 81 MG tablet Take 1 tablet (81 mg total) by mouth daily. 03/06/19   Sande Rives, MD  budesonide-formoterol [facility]) 160-4.5 MCG/ACT inhaler Inhale 2 puffs into the lungs 2 (two) times daily. 06/03/20   [provider]  diazepam (VALIUM) 10 MG tablet Take 10 mg by mouth at bedtime as needed for anxiety or sleep. Into the vagina every night as needed for pelvic floor spasms 11/29/18   [provider]  doxycycline (VIBRA-TABS) 100 MG tablet Take 1 tablet (100 mg total) by mouth every 12 (twelve) hours. 09/23/20   Cipriano Bunker, MD  Echinacea 400 MG CAPS Take 400 mg by mouth daily.    [provider]  escitalopram (  LEXAPRO) 20 MG tablet Take 20 mg by mouth daily.    [provider]  fluticasone (FLONASE) 50 MCG/ACT nasal spray Place 2 sprays into the nose daily as needed for allergies. 12/04/17   [provider]  ibuprofen (ADVIL) 200 MG tablet Take 400 mg by mouth every 6 (six) hours as needed for mild pain.    [provider]  levocetirizine (XYZAL) 5 MG tablet Take 1 tablet by mouth daily.    [provider]  levothyroxine (SYNTHROID) 25 MCG tablet Take 25 mcg by mouth daily. 12/26/18   [provider]  lisinopril (ZESTRIL) 10 MG tablet Take 10 mg by mouth daily. 12/26/18   [provider]  methylPREDNISolone (MEDROL DOSEPAK) 4 MG TBPK tablet Advised to take it  as directed. 09/23/20   Cipriano BunkerKumar, Pardeep, MD  montelukast (SINGULAIR) 10 MG tablet Take 10 mg by mouth at bedtime.    [provider]  Multiple Vitamin (MULTIVITAMIN WITH MINERALS) TABS tablet Take 1 tablet by mouth daily.    [provider]  Multiple Vitamin (MULTIVITAMIN) capsule Take 1 capsule by mouth daily.    [provider]  rosuvastatin (CRESTOR) 10 MG tablet Take 10 mg by mouth daily.    [provider]  zinc gluconate 50 MG tablet Take 50 mg by mouth daily.    [provider]    Allergies    Amoxicillin, Azithromycin, Clindamycin, Codeine, Fexofenadine, Hydrocodone bit-homatrop mbr, and Sulfamethoxazole  Review of Systems   Review of Systems  Constitutional: Negative for fever.  HENT: Negative for sore throat.   Eyes: Negative for visual disturbance.  Respiratory: Negative for shortness of breath.   Cardiovascular: Negative for chest pain.  Gastrointestinal: Negative for abdominal pain.  Genitourinary: Negative for dysuria.  Musculoskeletal: Negative for neck pain.  Skin: Negative for wound.  Neurological: Negative for headaches.    Physical Exam Updated Vital Signs BP 132/68   Pulse 73   Temp 99.3 F (37.4 C) (Oral)   Resp 16   Ht 5\' 6"  (1.676 m)   Wt 52.6 kg   SpO2 94%   BMI 18.72 kg/m   Physical Exam Vitals and nursing note reviewed.  Constitutional:      General: She is not in acute distress.    Appearance: Normal appearance. She is well-developed.  HENT:     Head: Normocephalic and atraumatic.  Eyes:     Conjunctiva/sclera: Conjunctivae normal.  Cardiovascular:     Rate and Rhythm: Normal rate and regular rhythm.     Heart sounds: No murmur heard.   Pulmonary:     Effort: Pulmonary effort is normal. No respiratory distress.     Breath sounds: Normal breath sounds.  Abdominal:     Palpations: Abdomen is soft.     Tenderness: There is no abdominal tenderness.  Musculoskeletal:        General: Tenderness  present. No deformity.     Cervical back: Neck supple.     Right lower leg: No edema.     Left lower leg: No edema.     Comments: Full range of upper extremities without any pain or limitation.  Right lower extremity nontender full range of motion.  Left lower extremity tenderness about her left hip.  Knee and ankle nontender.  There is no shortening or rotation.  Skin:    General: Skin is warm and dry.  Neurological:     General: No focal deficit present.     Mental Status: She is alert.  Sensory: No sensory deficit.     Motor: No weakness.     ED Results / Procedures / Treatments   Labs (all labs ordered are listed, but only abnormal results are displayed) Labs Reviewed  BASIC METABOLIC PANEL - Abnormal; Notable for the following components:      Result Value   Calcium 8.5 (*)    All other components within normal limits  RESP PANEL BY RT-PCR (FLU A&B, COVID) ARPGX2  CBC WITH DIFFERENTIAL/PLATELET  PROTIME-INR  HEPATIC FUNCTION PANEL  TYPE AND SCREEN  ABO/RH    EKG EKG Interpretation  Date/Time:  Sunday Oct 10 2020 19:48:26 EDT Ventricular Rate:  76 PR Interval:  174 QRS Duration: 84 QT Interval:  387 QTC Calculation: 436 R Axis:   -64 Text Interpretation: Sinus rhythm Left anterior fascicular block Abnormal R-wave progression, early transition ST elevation, consider inferior injury No significant change since last tracing Confirmed by Meridee Score [phone]) on 10/10/2020 7:53:57 PM   Radiology DG Chest 1 View  Result Date: 10/10/2020 CLINICAL DATA:  Recent fall with known left hip fracture, initial encounter EXAM: CHEST  1 VIEW COMPARISON:  09/20/2020 FINDINGS: Cardiac shadow is within normal limits. The lungs are hyperinflated. No focal infiltrate or sizable effusion is seen. No bony abnormality is noted. IMPRESSION: COPD without acute abnormality. Electronically Signed   By: Alcide Clever M.D.   On: 10/10/2020 19:17   DG Hip Unilat With Pelvis 2-3 Views  Left  Result Date: 10/10/2020 CLINICAL DATA:  Recent fall with hip pain, initial encounter EXAM: DG HIP (WITH OR WITHOUT PELVIS) 2-3V LEFT COMPARISON:  None. FINDINGS: Pelvic ring is intact. Comminuted intratrochanteric fracture is noted with impaction and angulation at the fracture site. Degenerative changes of the hip joints are noted bilaterally. No soft tissue abnormality is seen. IMPRESSION: Left intratrochanteric fracture as described. Electronically Signed   By: Alcide Clever M.D.   On: 10/10/2020 19:16    Procedures Procedures   Medications Ordered in ED Medications  acetaminophen (TYLENOL) tablet 650 mg (650 mg Oral Given 10/10/20 2158)  mometasone-formoterol (DULERA) 200-5 MCG/ACT inhaler 2 puff (has no administration in time range)  montelukast (SINGULAIR) tablet 10 mg (has no administration in time range)  escitalopram (LEXAPRO) tablet 20 mg (has no administration in time range)  levothyroxine (SYNTHROID) tablet 50 mcg (has no administration in time range)  heparin injection 5,000 Units (has no administration in time range)  oxyCODONE (Oxy IR/ROXICODONE) immediate release tablet 5 mg (has no administration in time range)  polyethylene glycol (MIRALAX / GLYCOLAX) packet 17 g (has no administration in time range)  senna (SENOKOT) tablet 8.6 mg (has no administration in time range)  HYDROmorphone (DILAUDID) injection 1 mg (has no administration in time range)  ondansetron (ZOFRAN) injection 4 mg (4 mg Intravenous Given 10/10/20 1941)  HYDROmorphone (DILAUDID) injection 1 mg (1 mg Intravenous Given 10/10/20 2227)    ED Course  I have reviewed the triage vital signs and the nursing notes.  Pertinent labs & imaging results that were available during my care of the patient were reviewed by me and considered in my medical decision making (see chart for details).  Clinical Course as of 10/10/20 2243  Wynelle Link Oct 10, 2020  6222 Reviewed case with Dr. Jena Gauss orthopedics.  He is asking the  patient to be n.p.o., get a COVID test, possible or case tonight versus tomorrow. [MB]  1954 Dr. Jena Gauss message me that she is unable to go tonight.  He is asking for a medical  admission and n.p.o. after midnight.  He is ordering Buck's traction for her leg. [MB]  2012 Discussed with the family practice team who will evaluate the patient for admission. [MB]    Clinical Course User Index [MB] Terrilee Files, MD   MDM Rules/Calculators/A&P                         This patient complains of left hip pain after fall; this involves an extensive number of treatment Options and is a complaint that carries with it a high risk of complications and Morbidity. The differential includes fracture, contusion, dislocation, metabolic derangement  I ordered, reviewed and interpreted labs, which included CBC with normal white count normal hemoglobin, chemistries normal, INR normal I ordered medication IV pain medication with transient improvement in patient's symptoms I ordered imaging studies which included chest x-ray and hip x-ray and I independently    visualized and interpreted imaging which showed intertrochanteric fracture hip Additional history obtained from patient's family member Previous records obtained and reviewed in epic, no recent prior admissions I consulted orthopedics Dr. Jena Gauss and family practice team and discussed lab and imaging findings  Critical Interventions: None  After the interventions stated above, I reevaluated the patient and found patient's pain still to be inadequately controlled.  Dr. Jena Gauss is ordering Buck's traction for patient.  She will need to be admitted to the hospital for anticipated operative repair.   Final Clinical Impression(s) / ED Diagnoses Final diagnoses:  Fall, initial encounter  Closed fracture of left hip, initial encounter [facility])    Rx / DC Orders ED Discharge Orders    None       Terrilee Files, MD 10/10/20 2246

## 2020-10-10 NOTE — H&P (Signed)
Family Medicine Teaching [facility] Admission History and Physical Service Pager: [phone]  Patient name: Melody White Medical record number: [id number] Date of birth: Sep 17, 1950 Age: 70 y.o. Gender: female  Primary Care Provider: Richmond Campbell., PA-C Consultants: Ortho Code Status: Full Preferred Emergency Contact:   Chief Complaint: Fall  Assessment and Plan: Melody White is a 70 y.o. female presenting with fall. PMH is significant for COPD, CVA, HTN, HLD, Hypothyroidism and Depression.   Left Introchanteric Hip Fracture Experienced fall earlier this evening landing on left hip.  No chest pain or difficulty breathing.  Hemodynamically stable.  No sign of neurovascualr compromise on physical exam.  Does have visible defomirty of left hip/anterior thigh.  Hemodynamically stable.   Currently satting 95% on RA.  Had desat before on previous dose of Fentanyl.  Switched to Morphine but unresponsive to 4 mg dose.  No concerning signs of neuorvascular compromise on physical exam.  No history of falls or fractures.  Labs otherwise normal other than mild hypocalcemia of 8.5  Type and Screen ordered.  PT, INR within normal limits.  Surgery following.  Placed in Bucks traction.  Plan for cephalomedullary nailing tomorrow  - Admit to Inpatient, Attending Dr Manson Passey  - Ortho following, appreciate recs - Vital signs q4hr - NPO after midnight - Scheduled Tylenol 650 mg q6hr - Dilaudid 1 mg q3hr PRN - CBC/BMP in AM - Miralax BID - Senna daily PRN - Zofran 4 mg PRN  COPD Recent exacerbation for which she was hospitalized one month ago.  Has 40 pack year smoking history, and smokes pack a day. No O2 requirement at home.  Currently satting well on RA, but desatted earlier with Fentanyl  Uses Symbicort BID, Singulair daily,  - Start Dulera BID - Continue Fluticasone  - Continuous pulse Ox - Keep O2 sats >88% - Nicotine patch PRN - Recommend outpatient Low dose CT given smoking  history  CVA  TIA over 10 years ago.  On daily Aspirin, but unsure id 81 mg or 325 mg. - Hold home Aspirin  HTN Currently normotensive.  Lisinopril 10 mg at home daily. - Hol home Lisinopril  Hypothyroidism TSH on 10/01/20 was 7.78 though this was in setting of recent COPD exacerbation.  On  50 mcg Synthroid daily. - Continue home synthroid - Obtain T4, T3  HLD On Rosuvastatin 10 mg daily.  LDL one month ago 92.  - Hold home Rosuvastatin     Depression On Amitriptyline 25 mg qhs and Lexapro 20 mg. - Continue Amitriptyline and Lexapro  FEN/GI: NPO after midnight Prophylaxis: One dose of Heparin, followed by SCD's  Disposition: Med/Surg  History of Present Illness:  Melody White is a 70 y.o. female presenting with fall.  Indicates lost balance when attempting to step into car.  Landed on left hip, denies hitting head. Indicates is having left hip.  Denies chest pain, difficulty breathing or any other specific symptoms.   Reports taking daily Aspirin but some confusion about dose.  Last PCP visit 9 days ago was on 81 mg.  Indicates uses inhaler at home daily but denies history of COPD.  Has smoked pack for past 40 years  Review Of Systems: Per HPI with the following additions:  Review of Systems  Respiratory: Negative for chest tightness and shortness of breath.   Cardiovascular: Negative for chest pain.     Patient Active Problem List   Diagnosis Date Noted  . Malnutrition of moderate degree 09/23/2020  . COPD  exacerbation (HCC) 09/20/2020  . Acute respiratory failure with hypoxia (HCC) 09/20/2020  . Tobacco use 09/20/2020  . History of CVA (cerebrovascular accident) 09/20/2020  . HTN (hypertension) 09/20/2020  . Hypothyroidism 09/20/2020  . HLD (hyperlipidemia) 09/20/2020    Past Medical History: Past Medical History:  Diagnosis Date  . Hypertension   . Stroke (HCC)   . Thyroid disease     Past Surgical History: Past Surgical History:  Procedure Laterality  Date  . ABDOMINAL HYSTERECTOMY    . bowel obstruction    . BREAST EXCISIONAL BIOPSY Right 20+ yrs ago   benign  . SHOULDER SURGERY      Social History: Social History   Tobacco Use  . Smoking status: Current Every Day Smoker    Packs/day: 0.50    Years: 50.00    Pack years: 25.00    Types: Cigarettes  . Smokeless tobacco: Never Used  Substance Use Topics  . Alcohol use: Not Currently  . Drug use: Not Currently   Additional social history:  Please also refer to relevant sections of EMR.  Family History: Family History  Problem Relation Age of Onset  . Arthritis Mother     Allergies and Medications: Allergies  Allergen Reactions  . Amoxicillin Other (See Comments)    other   . Azithromycin Itching  . Clindamycin Other (See Comments)    Funny feeling other   . Codeine Itching and Other (See Comments)    other   . Fexofenadine Other (See Comments)    Funny feeling other   . Hydrocodone Bit-Homatrop Mbr Itching  . Sulfamethoxazole Nausea Only and Other (See Comments)    other    No current facility-administered medications on file prior to encounter.   Current Outpatient Medications on File Prior to Encounter  Medication Sig Dispense Refill  . amitriptyline (ELAVIL) 25 MG tablet Take 25 mg by mouth at bedtime.    . Ascorbic Acid (VITAMIN C) 1000 MG tablet Take 1,000 mg by mouth daily.    Marland Kitchen aspirin EC 81 MG tablet Take 1 tablet (81 mg total) by mouth daily. 90 tablet 3  . budesonide-formoterol (SYMBICORT) 160-4.5 MCG/ACT inhaler Inhale 2 puffs into the lungs 2 (two) times daily.    . diazepam (VALIUM) 10 MG tablet Take 10 mg by mouth at bedtime as needed for anxiety or sleep. Into the vagina every night as needed for pelvic floor spasms    . doxycycline (VIBRA-TABS) 100 MG tablet Take 1 tablet (100 mg total) by mouth every 12 (twelve) hours. 2 tablet 0  . Echinacea 400 MG CAPS Take 400 mg by mouth daily.    Marland Kitchen escitalopram (LEXAPRO) 20 MG tablet Take 20 mg by  mouth daily.    . fluticasone (FLONASE) 50 MCG/ACT nasal spray Place 2 sprays into the nose daily as needed for allergies.    Marland Kitchen ibuprofen (ADVIL) 200 MG tablet Take 400 mg by mouth every 6 (six) hours as needed for mild pain.    Marland Kitchen levocetirizine (XYZAL) 5 MG tablet Take 1 tablet by mouth daily.    Marland Kitchen levothyroxine (SYNTHROID) 25 MCG tablet Take 25 mcg by mouth daily.    Marland Kitchen lisinopril (ZESTRIL) 10 MG tablet Take 10 mg by mouth daily.    . methylPREDNISolone (MEDROL DOSEPAK) 4 MG TBPK tablet Advised to take it as directed. 21 tablet 0  . montelukast (SINGULAIR) 10 MG tablet Take 10 mg by mouth at bedtime.    . Multiple Vitamin (MULTIVITAMIN WITH MINERALS) TABS tablet Take  1 tablet by mouth daily.    . Multiple Vitamin (MULTIVITAMIN) capsule Take 1 capsule by mouth daily.    . rosuvastatin (CRESTOR) 10 MG tablet Take 10 mg by mouth daily.    Marland Kitchen zinc gluconate 50 MG tablet Take 50 mg by mouth daily.      Objective: BP 132/80   Pulse 73   Temp 99.3 F (37.4 C) (Oral)   Resp 15   Ht 5\' 6"  (1.676 m)   Wt 52.6 kg   SpO2 95%   BMI 18.72 kg/m  Exam:  Physical Exam Constitutional:      Appearance: Normal appearance.     Comments: Appears uncomfortable  HENT:     Head: Normocephalic and atraumatic.     Mouth/Throat:     Mouth: Mucous membranes are moist.  Cardiovascular:     Rate and Rhythm: Normal rate and regular rhythm.     Pulses: Normal pulses.  Pulmonary:     Effort: Pulmonary effort is normal.     Breath sounds: Normal breath sounds.  Musculoskeletal:     Right hip: Normal.     Left hip: Deformity present. No lacerations.     Right foot: Normal. Normal range of motion and normal capillary refill. Normal pulse.     Left foot: Normal. Normal range of motion and normal capillary refill. Normal pulse.  Skin:    General: Skin is warm.  Neurological:     General: No focal deficit present.     Mental Status: She is alert.     Labs and Imaging: CBC BMET  Recent Labs  Lab  10/10/20 1744  WBC 6.9  HGB 14.4  HCT 42.6  PLT 163   Recent Labs  Lab 10/10/20 1744  NA 137  K 3.8  CL 103  CO2 24  BUN 16  CREATININE 0.94  GLUCOSE 85  CALCIUM 8.5*     EKG: Normal sinus rhthym  10/12/20, MD 10/10/2020, 8:14 PM PGY-1, [facility] Family Medicine FPTS Intern pager: [phone], text pages welcome

## 2020-10-10 NOTE — ED Triage Notes (Signed)
Patient to ED via EMS for complaints of left hip pain after fall. States she was walking to house and tripped and fell in driveway. 200 mcg of fentanyl given PTA. Denies LOC or head injury.

## 2020-10-11 ENCOUNTER — Inpatient Hospital Stay (HOSPITAL_COMMUNITY): Payer: Medicare Other | Admitting: Anesthesiology

## 2020-10-11 ENCOUNTER — Inpatient Hospital Stay (HOSPITAL_COMMUNITY): Payer: Medicare Other

## 2020-10-11 ENCOUNTER — Encounter (HOSPITAL_COMMUNITY)
Admission: EM | Disposition: A | Discharge status: Home Health | Payer: Self-pay | Source: Home / Self Care | Attending: Family Medicine

## 2020-10-11 ENCOUNTER — Encounter (HOSPITAL_COMMUNITY): Payer: Self-pay | Admitting: Student in an Organized Health Care Education/Training Program

## 2020-10-11 DIAGNOSIS — J449 Chronic obstructive pulmonary disease, unspecified: Secondary | ICD-10-CM | POA: Diagnosis not present

## 2020-10-11 DIAGNOSIS — Z720 Tobacco use: Secondary | ICD-10-CM

## 2020-10-11 DIAGNOSIS — S72002A Fracture of unspecified part of neck of left femur, initial encounter for closed fracture: Secondary | ICD-10-CM | POA: Diagnosis not present

## 2020-10-11 DIAGNOSIS — Z8673 Personal history of transient ischemic attack (TIA), and cerebral infarction without residual deficits: Secondary | ICD-10-CM

## 2020-10-11 DIAGNOSIS — I10 Essential (primary) hypertension: Secondary | ICD-10-CM

## 2020-10-11 HISTORY — PX: INTRAMEDULLARY (IM) NAIL INTERTROCHANTERIC: SHX5875

## 2020-10-11 LAB — BASIC METABOLIC PANEL
Anion gap: 8 (ref 5–15)
BUN: 14 mg/dL (ref 8–23)
CO2: 24 mmol/L (ref 22–32)
Calcium: 8.5 mg/dL — ABNORMAL LOW (ref 8.9–10.3)
Chloride: 104 mmol/L (ref 98–111)
Creatinine, Ser: 0.87 mg/dL (ref 0.44–1.00)
GFR, Estimated: >60 mL/min (ref >60)
Glucose, Bld: 107 mg/dL — ABNORMAL HIGH (ref 70–99)
Potassium: 3.8 mmol/L (ref 3.5–5.1)
Sodium: 136 mmol/L (ref 135–145)

## 2020-10-11 LAB — CBC
HCT: 40.9 % (ref 36.0–46.0)
Hemoglobin: 13.6 g/dL (ref 12.0–15.0)
MCH: 32.6 pg (ref 26.0–34.0)
MCHC: 33.3 g/dL (ref 30.0–36.0)
MCV: 98.1 fL (ref 80.0–100.0)
Platelets: 149 10*3/uL — ABNORMAL LOW (ref 150–400)
RBC: 4.17 MIL/uL (ref 3.87–5.11)
RDW: 12.6 % (ref 11.5–15.5)
WBC: 8.8 10*3/uL (ref 4.0–10.5)
nRBC: 0 % (ref 0.0–0.2)

## 2020-10-11 LAB — HEPATIC FUNCTION PANEL
ALT: 28 U/L (ref 0–44)
AST: 29 U/L (ref 15–41)
Albumin: 3.1 g/dL — ABNORMAL LOW (ref 3.5–5.0)
Alkaline Phosphatase: 53 U/L (ref 38–126)
Bilirubin, Direct: <0.1 mg/dL (ref 0.0–0.2)
Total Bilirubin: 0.6 mg/dL (ref 0.3–1.2)
Total Protein: 5.5 g/dL — ABNORMAL LOW (ref 6.5–8.1)

## 2020-10-11 LAB — ABO/RH: ABO/RH(D): O POS

## 2020-10-11 LAB — T4, FREE: Free T4: 0.88 ng/dL (ref 0.61–1.12)

## 2020-10-11 LAB — SURGICAL PCR SCREEN
MRSA, PCR: NEGATIVE
Staphylococcus aureus: NEGATIVE

## 2020-10-11 SURGERY — FIXATION, FRACTURE, INTERTROCHANTERIC, WITH INTRAMEDULLARY ROD
Anesthesia: General | Laterality: Left

## 2020-10-11 MED ORDER — ONDANSETRON HCL 4 MG/2ML IJ SOLN: 4.0000 mg | Freq: Four times a day (QID) | INTRAMUSCULAR | Status: DC | PRN

## 2020-10-11 MED ORDER — CEFAZOLIN SODIUM-DEXTROSE 2-4 GM/100ML-% IV SOLN
2.0000 g | Freq: Three times a day (TID) | INTRAVENOUS | Status: AC
  Administered 2020-10-11 – 2020-10-12 (×3): 2 g via INTRAVENOUS
  Filled 2020-10-11 (×4): qty 100

## 2020-10-11 MED ORDER — HYDROCODONE-ACETAMINOPHEN 7.5-325 MG PO TABS
1.0000 | ORAL_TABLET | ORAL | Status: DC | PRN
  Administered 2020-10-11 – 2020-10-12 (×2): 2 via ORAL
  Filled 2020-10-11 (×2): qty 2

## 2020-10-11 MED ORDER — VANCOMYCIN HCL 1000 MG IV SOLR
INTRAVENOUS | Status: AC
  Filled 2020-10-11: qty 1000

## 2020-10-11 MED ORDER — PROPOFOL 10 MG/ML IV BOLUS
INTRAVENOUS | Status: AC
  Filled 2020-10-11: qty 20

## 2020-10-11 MED ORDER — LIDOCAINE 2% (20 MG/ML) 5 ML SYRINGE
INTRAMUSCULAR | Status: DC | PRN
  Administered 2020-10-11: 60 mg via INTRAVENOUS

## 2020-10-11 MED ORDER — ALBUTEROL SULFATE (2.5 MG/3ML) 0.083% IN NEBU: 2.5000 mg | INHALATION_SOLUTION | RESPIRATORY_TRACT | Status: DC | PRN

## 2020-10-11 MED ORDER — ACETAMINOPHEN 325 MG PO TABS
325.0000 mg | ORAL_TABLET | Freq: Four times a day (QID) | ORAL | Status: DC | PRN
Start: 2020-10-12 — End: 2020-10-14

## 2020-10-11 MED ORDER — MIDAZOLAM HCL 2 MG/2ML IJ SOLN
INTRAMUSCULAR | Status: AC
  Filled 2020-10-11: qty 2

## 2020-10-11 MED ORDER — ACETAMINOPHEN 10 MG/ML IV SOLN
INTRAVENOUS | Status: DC | PRN
  Administered 2020-10-11: 1000 mg via INTRAVENOUS

## 2020-10-11 MED ORDER — ONDANSETRON HCL 4 MG/2ML IJ SOLN
INTRAMUSCULAR | Status: AC
  Filled 2020-10-11: qty 2

## 2020-10-11 MED ORDER — FENTANYL CITRATE (PF) 250 MCG/5ML IJ SOLN
INTRAMUSCULAR | Status: DC | PRN
  Administered 2020-10-11: 50 ug via INTRAVENOUS

## 2020-10-11 MED ORDER — TRANEXAMIC ACID-NACL 1000-0.7 MG/100ML-% IV SOLN
INTRAVENOUS | Status: DC | PRN
  Administered 2020-10-11: 1000 mg via INTRAVENOUS

## 2020-10-11 MED ORDER — DIPHENHYDRAMINE HCL 12.5 MG/5ML PO ELIX: 12.5000 mg | ORAL_SOLUTION | ORAL | Status: DC | PRN

## 2020-10-11 MED ORDER — MORPHINE SULFATE (PF) 2 MG/ML IV SOLN: 0.5000 mg | INTRAVENOUS | Status: DC | PRN

## 2020-10-11 MED ORDER — ROCURONIUM BROMIDE 10 MG/ML (PF) SYRINGE
PREFILLED_SYRINGE | INTRAVENOUS | Status: DC | PRN
  Administered 2020-10-11: 60 mg via INTRAVENOUS

## 2020-10-11 MED ORDER — SUGAMMADEX SODIUM 200 MG/2ML IV SOLN
INTRAVENOUS | Status: DC | PRN
  Administered 2020-10-11: 200 mg via INTRAVENOUS

## 2020-10-11 MED ORDER — ROCURONIUM BROMIDE 10 MG/ML (PF) SYRINGE
PREFILLED_SYRINGE | INTRAVENOUS | Status: AC
  Filled 2020-10-11: qty 10

## 2020-10-11 MED ORDER — PROPOFOL 10 MG/ML IV BOLUS
INTRAVENOUS | Status: DC | PRN
  Administered 2020-10-11: 80 mg via INTRAVENOUS

## 2020-10-11 MED ORDER — TRANEXAMIC ACID-NACL 1000-0.7 MG/100ML-% IV SOLN
1000.0000 mg | Freq: Once | INTRAVENOUS | Status: AC
  Administered 2020-10-11: 1000 mg via INTRAVENOUS
  Filled 2020-10-11: qty 100

## 2020-10-11 MED ORDER — 0.9 % SODIUM CHLORIDE (POUR BTL) OPTIME
TOPICAL | Status: DC | PRN
  Administered 2020-10-11: 1000 mL

## 2020-10-11 MED ORDER — ONDANSETRON HCL 4 MG/2ML IJ SOLN: 4.0000 mg | Freq: Once | INTRAMUSCULAR | Status: DC | PRN

## 2020-10-11 MED ORDER — LIDOCAINE 2% (20 MG/ML) 5 ML SYRINGE
INTRAMUSCULAR | Status: AC
  Filled 2020-10-11: qty 5

## 2020-10-11 MED ORDER — TRANEXAMIC ACID-NACL 1000-0.7 MG/100ML-% IV SOLN
INTRAVENOUS | Status: AC
  Filled 2020-10-11: qty 100

## 2020-10-11 MED ORDER — HYDROCODONE-ACETAMINOPHEN 5-325 MG PO TABS
1.0000 | ORAL_TABLET | ORAL | Status: DC | PRN
  Administered 2020-10-11 – 2020-10-13 (×5): 2 via ORAL
  Filled 2020-10-11 (×5): qty 2

## 2020-10-11 MED ORDER — DEXAMETHASONE SODIUM PHOSPHATE 10 MG/ML IJ SOLN
INTRAMUSCULAR | Status: DC | PRN
  Administered 2020-10-11: 5 mg via INTRAVENOUS

## 2020-10-11 MED ORDER — DEXAMETHASONE SODIUM PHOSPHATE 10 MG/ML IJ SOLN
INTRAMUSCULAR | Status: AC
  Filled 2020-10-11: qty 1

## 2020-10-11 MED ORDER — ASPIRIN EC 81 MG PO TBEC
81.0000 mg | DELAYED_RELEASE_TABLET | Freq: Every day | ORAL | Status: DC
  Administered 2020-10-11 – 2020-10-13 (×3): 81 mg via ORAL
  Filled 2020-10-11 (×3): qty 1

## 2020-10-11 MED ORDER — ACETAMINOPHEN 10 MG/ML IV SOLN
INTRAVENOUS | Status: AC
  Filled 2020-10-11: qty 100

## 2020-10-11 MED ORDER — TIOTROPIUM BROMIDE MONOHYDRATE 18 MCG IN CAPS: 18.0000 ug | ORAL_CAPSULE | Freq: Every day | RESPIRATORY_TRACT | Status: DC

## 2020-10-11 MED ORDER — METOCLOPRAMIDE HCL 5 MG/ML IJ SOLN
5.0000 mg | Freq: Three times a day (TID) | INTRAMUSCULAR | Status: DC | PRN
Start: 2020-10-11 — End: 2020-10-14

## 2020-10-11 MED ORDER — NICOTINE 7 MG/24HR TD PT24: 7.0000 mg | MEDICATED_PATCH | Freq: Every day | TRANSDERMAL | Status: DC | PRN

## 2020-10-11 MED ORDER — PHENYLEPHRINE 40 MCG/ML (10ML) SYRINGE FOR IV PUSH (FOR BLOOD PRESSURE SUPPORT)
PREFILLED_SYRINGE | INTRAVENOUS | Status: AC
  Filled 2020-10-11: qty 10

## 2020-10-11 MED ORDER — ONDANSETRON HCL 4 MG PO TABS: 4.0000 mg | ORAL_TABLET | Freq: Four times a day (QID) | ORAL | Status: DC | PRN

## 2020-10-11 MED ORDER — ENOXAPARIN SODIUM 40 MG/0.4ML IJ SOSY
40.0000 mg | PREFILLED_SYRINGE | INTRAMUSCULAR | Status: DC
  Administered 2020-10-12 – 2020-10-13 (×2): 40 mg via SUBCUTANEOUS
  Filled 2020-10-11 (×2): qty 0.4

## 2020-10-11 MED ORDER — LACTATED RINGERS IV SOLN: INTRAVENOUS | Status: DC | PRN

## 2020-10-11 MED ORDER — UMECLIDINIUM BROMIDE 62.5 MCG/INH IN AEPB
1.0000 | INHALATION_SPRAY | Freq: Every day | RESPIRATORY_TRACT | Status: DC
  Administered 2020-10-12 – 2020-10-13 (×2): 1 via RESPIRATORY_TRACT
  Filled 2020-10-11: qty 7

## 2020-10-11 MED ORDER — ALBUTEROL SULFATE HFA 108 (90 BASE) MCG/ACT IN AERS: 1.0000 | INHALATION_SPRAY | RESPIRATORY_TRACT | Status: DC | PRN

## 2020-10-11 MED ORDER — POLYETHYLENE GLYCOL 3350 17 G PO PACK: 17.0000 g | PACK | Freq: Every day | ORAL | Status: DC | PRN

## 2020-10-11 MED ORDER — DOCUSATE SODIUM 100 MG PO CAPS
100.0000 mg | ORAL_CAPSULE | Freq: Two times a day (BID) | ORAL | Status: DC
  Administered 2020-10-11 (×2): 100 mg via ORAL
  Filled 2020-10-11 (×2): qty 1

## 2020-10-11 MED ORDER — METOCLOPRAMIDE HCL 5 MG PO TABS: 5.0000 mg | ORAL_TABLET | Freq: Three times a day (TID) | ORAL | Status: DC | PRN

## 2020-10-11 MED ORDER — PHENYLEPHRINE 40 MCG/ML (10ML) SYRINGE FOR IV PUSH (FOR BLOOD PRESSURE SUPPORT)
PREFILLED_SYRINGE | INTRAVENOUS | Status: DC | PRN
  Administered 2020-10-11: 120 ug via INTRAVENOUS
  Administered 2020-10-11: 160 ug via INTRAVENOUS
  Administered 2020-10-11: 120 ug via INTRAVENOUS

## 2020-10-11 MED ORDER — ONDANSETRON HCL 4 MG/2ML IJ SOLN
INTRAMUSCULAR | Status: DC | PRN
  Administered 2020-10-11 (×2): 4 mg via INTRAVENOUS

## 2020-10-11 MED ORDER — EPHEDRINE 5 MG/ML INJ
INTRAVENOUS | Status: AC
  Filled 2020-10-11: qty 10

## 2020-10-11 MED ORDER — PHENYLEPHRINE HCL-NACL 10-0.9 MG/250ML-% IV SOLN
INTRAVENOUS | Status: DC | PRN
  Administered 2020-10-11: 45 ug/min via INTRAVENOUS

## 2020-10-11 MED ORDER — FENTANYL CITRATE (PF) 100 MCG/2ML IJ SOLN: 25.0000 ug | INTRAMUSCULAR | Status: DC | PRN

## 2020-10-11 MED ORDER — FENTANYL CITRATE (PF) 250 MCG/5ML IJ SOLN
INTRAMUSCULAR | Status: AC
  Filled 2020-10-11: qty 5

## 2020-10-11 MED ORDER — EPHEDRINE SULFATE-NACL 50-0.9 MG/10ML-% IV SOSY
PREFILLED_SYRINGE | INTRAVENOUS | Status: DC | PRN
  Administered 2020-10-11 (×3): 10 mg via INTRAVENOUS

## 2020-10-11 MED ORDER — SODIUM CHLORIDE 0.9 % IV SOLN: INTRAVENOUS | Status: DC

## 2020-10-11 MED ORDER — ROSUVASTATIN CALCIUM 5 MG PO TABS
10.0000 mg | ORAL_TABLET | Freq: Every day | ORAL | Status: DC
  Administered 2020-10-11 – 2020-10-13 (×3): 10 mg via ORAL
  Filled 2020-10-11 (×3): qty 2

## 2020-10-11 SURGICAL SUPPLY — 53 items
ADH SKN CLS APL DERMABOND .7 (GAUZE/BANDAGES/DRESSINGS) ×1
APL PRP STRL LF DISP 70% ISPRP (MISCELLANEOUS) ×1
BIT DRILL INTERTAN LAG SCREW (BIT) ×2 IMPLANT
BIT DRILL SHORT 4.0 (BIT) IMPLANT
BRUSH SCRUB EZ PLAIN DRY (MISCELLANEOUS) ×6 IMPLANT
CHLORAPREP W/TINT 26 (MISCELLANEOUS) ×3 IMPLANT
COVER PERINEAL POST (MISCELLANEOUS) ×3 IMPLANT
COVER SURGICAL LIGHT HANDLE (MISCELLANEOUS) ×3 IMPLANT
COVER WAND RF STERILE (DRAPES) IMPLANT
DERMABOND ADVANCED (GAUZE/BANDAGES/DRESSINGS) ×2
DERMABOND ADVANCED .7 DNX12 (GAUZE/BANDAGES/DRESSINGS) ×1 IMPLANT
DRAPE C-ARM 35X43 STRL (DRAPES) ×3 IMPLANT
DRAPE IMP U-DRAPE 54X76 (DRAPES) ×6 IMPLANT
DRAPE INCISE IOBAN 66X45 STRL (DRAPES) ×3 IMPLANT
DRAPE STERI IOBAN 125X83 (DRAPES) ×3 IMPLANT
DRAPE SURG 17X23 STRL (DRAPES) ×6 IMPLANT
DRAPE U-SHAPE 47X51 STRL (DRAPES) ×3 IMPLANT
DRESSING MEPILEX FLEX 4X4 (GAUZE/BANDAGES/DRESSINGS) IMPLANT
DRILL BIT SHORT 4.0 (BIT) ×3
DRSG MEPILEX BORDER 4X4 (GAUZE/BANDAGES/DRESSINGS) ×3 IMPLANT
DRSG MEPILEX BORDER 4X8 (GAUZE/BANDAGES/DRESSINGS) ×3 IMPLANT
DRSG MEPILEX FLEX 4X4 (GAUZE/BANDAGES/DRESSINGS) ×9
ELECT REM PT RETURN 9FT ADLT (ELECTROSURGICAL) ×3
ELECTRODE REM PT RTRN 9FT ADLT (ELECTROSURGICAL) ×1 IMPLANT
GLOVE BIO SURGEON STRL SZ 6.5 (GLOVE) ×6 IMPLANT
GLOVE BIO SURGEON STRL SZ7.5 (GLOVE) ×12 IMPLANT
GLOVE BIO SURGEONS STRL SZ 6.5 (GLOVE) ×3
GLOVE BIOGEL PI IND STRL 7.5 (GLOVE) ×1 IMPLANT
GLOVE BIOGEL PI INDICATOR 7.5 (GLOVE) ×4
GLOVE SURG UNDER POLY LF SZ6.5 (GLOVE) ×3 IMPLANT
GOWN STRL REUS W/ TWL LRG LVL3 (GOWN DISPOSABLE) ×1 IMPLANT
GOWN STRL REUS W/TWL LRG LVL3 (GOWN DISPOSABLE) ×3
GUIDE PIN 3.2X343 (PIN) ×2
GUIDE PIN 3.2X343MM (PIN) ×6
GUIDE ROD 3.0 (MISCELLANEOUS) ×3
KIT BASIN OR (CUSTOM PROCEDURE TRAY) ×3 IMPLANT
KIT TURNOVER KIT B (KITS) ×3 IMPLANT
MANIFOLD NEPTUNE II (INSTRUMENTS) ×3 IMPLANT
NAIL LOCK CANN 10X380 130D LT (Nail) ×2 IMPLANT
NS IRRIG 1000ML POUR BTL (IV SOLUTION) ×3 IMPLANT
PACK GENERAL/GYN (CUSTOM PROCEDURE TRAY) ×3 IMPLANT
PAD ARMBOARD 7.5X6 YLW CONV (MISCELLANEOUS) ×6 IMPLANT
PIN GUIDE 3.2X343MM (PIN) IMPLANT
ROD GUIDE 3.0 (MISCELLANEOUS) IMPLANT
SCREW LAG COMPR KIT 95/90 (Screw) ×2 IMPLANT
SCREW TRIGEN LOW PROF 5.0X35 (Screw) ×2 IMPLANT
SUT MNCRL AB 3-0 PS2 18 (SUTURE) ×3 IMPLANT
SUT VIC AB 0 CT1 27 (SUTURE)
SUT VIC AB 0 CT1 27XBRD ANBCTR (SUTURE) IMPLANT
SUT VIC AB 2-0 CT1 27 (SUTURE) ×6
SUT VIC AB 2-0 CT1 TAPERPNT 27 (SUTURE) ×2 IMPLANT
TOWEL GREEN STERILE (TOWEL DISPOSABLE) ×6 IMPLANT
WATER STERILE IRR 1000ML POUR (IV SOLUTION) ×1 IMPLANT

## 2020-10-11 NOTE — Progress Notes (Addendum)
FPTS Interim Progress Note-Post-op  S: Patient evaluated at bedside with sister present in the room.  Patient able to state her name, age, place, remembers about her procedure.  Denies any complaints and states her leg just feels numb.  O: BP (!) 102/58   Pulse 73   Temp 98.5 F (36.9 C) (Oral)   Resp 18   Ht 5\' 6"  (1.676 m)   Wt 116 lb (52.6 kg)   SpO2 96%   BMI 18.72 kg/m   General: Pleasant lady lying comfortably in bed, NAD Cardiovascular: Regular rate and rhythm Pulmonary: Clear to auscultation bilaterally Gastrointestinal: Soft, nondistended, nontender, bowel sounds present Extremity: Left leg has 2 bandages in place, no redness, no erythema, no swelling noted Psych: Normal mood and affect  A/P: Left intertrochanteric hip fracture s/p Cephalomedullary nail -- Appreciate Ortho's assistance -- Heart healthy diet -- Lovenox 40 mg  -- Tylenol 325-650 mg POq6PRN -- Aspirin 81 mg -- Cefazolin IV 2g q8 for 3 doses (5/30-) -- Norco 1 to 2 tablets, p.o. q4 PRN for severe pain -- Vicodin 5-325 mg 1-2 tablets q4h PRN for moderate pain --Colace 100 mg twice daily for constipation -- Reglan 5-10 mg Po or IV for nausea, if Zofran ineffective -- Neurovascular checks every 2 hours -- Nonweightbearing  Tobacco use Patient informed that he has cut down to 4 cigarettes/day from last 2 weeks .--Nicotine patch 7 mg  Ofnote: If patients vitals are unstable, please re-check CBC, BMP immediately.   01-22-1974, MD 10/11/2020, 12:13 PM PGY-1, [facility] Family Medicine Service pager [phone]

## 2020-10-11 NOTE — Op Note (Signed)
Orthopaedic Surgery Operative Note (CSN: [id number] ) Date of Surgery: 10/11/2020  Admit Date: 10/10/2020   Diagnoses: Pre-Op Diagnoses: Left intertrochanteric femur fracture   Post-Op Diagnosis: Same  Procedures: CPT 27245-Cephalomedullary nailing of left intertrochanteric femur fracture  Surgeons : Primary: Roby Lofts, MD  Assistant: None  Location: OR 3   Anesthesia:General   Antibiotics: Ancef 2g preop   Tourniquet time:None used    Estimated Blood Loss:50 mL  Complications:None   Specimens:None   Implants: Implant Name Type Inv. Item Serial No. Manufacturer Lot No. LRB No. Used Action  NAIL LOCK CANN 10X380 130D LT - [id number] Nail NAIL LOCK CANN 10X380 130D LT  [employer] [id number] Left 1 Implanted  SCREW LAG COMBO 95.90 - [id number] Screw SCREW LAG COMBO 95.90  [employer] [id number] Left 1 Implanted  SCREW TRIGEN LOW PROF 5.0X35 - [id number] Screw SCREW TRIGEN LOW PROF 5.0X35  [employer] [id number] Left 1 Implanted  NAIL LOCK CANN 10X380 130D LT - [id number] Nail NAIL LOCK CANN 10X380 130D LT  [employer]  Left 1 Implanted  SCREW LAG COMBO 95.90 - [id number] Screw SCREW LAG COMBO 95.90  [employer]  Left 1 Implanted  SCREW TRIGEN LOW PROF 5.0X35 - [id number] Screw SCREW TRIGEN LOW PROF 5.0X35  [employer]  Left 1 Implanted     Indications for Surgery: 70 year old female who fell and sustained a left intertrochanteric femur fracture.  Due to the unstable nature of her injury I recommend proceeding with cephalomedullary nailing of her left hip.  Risks and benefits were discussed with the patient.  Risks included but not limited to bleeding, infection, malunion, nonunion, hardware failure, hardware rotation, nerve and blood vessel injury, DVT, even the possibility anesthetic complications.  The patient agreed to proceed with surgery and consent was obtained.  Operative  Findings: Cephalomedullary nailing of left intertrochanteric femur fracture using [employer] InterTAN 10 x 380 mm nail with a 95 mm lag screw/90 mm compression screw  Procedure: The patient was identified in the preoperative holding area. Consent was confirmed with the patient and their family and all questions were answered. The operative extremity was marked after confirmation with the patient. she was then brought back to the operating room by our anesthesia colleagues.  She was placed under general anesthetic and carefully transferred over to the [facility] table.  All bony prominences were well-padded.  Traction was applied to the left lower extremity and fluoroscopic imaging was obtained.  Adequate reduction was obtained of the proximal femur.  The left lower extremity was then prepped and draped in usual sterile fashion.  A timeout was performed to verify the patient, the procedure, and the extremity.  Preoperative antibiotics were dosed.  Small incision proximal to the greater trochanter was made and carried down through skin and subcutaneous tissue.  I then directed a threaded guidewire at the tip of the greater trochanter.  Advanced into the metaphysis and confirmed positioning with fluoroscopy.  I then used an entry reamer to enter the medullary canal.  I then passed a ball-tipped guidewire down the center of the canal and seated it into the distal metaphysis.  I then measured the length and chose to use a 380 mm nail.  I then reamed up to 11.5 mm.  I then placed a 10 x 380 mm nail down the center canal and seated until it was in appropriate position.  I used the targeting arm to make  a lateral incision and directed a threaded guidewire up into the head/neck segment.  I confirmed adequate tip apex distance using AP and lateral fluoroscopic imaging.  I then drilled the path for the compression screw.  I placed an antirotation bar.  I then drilled the path for the lag screw.  The lag screw was placed.   Excellent fixation was obtained.  I then placed the compression screw and did not compress the construct much at all.  I then statically locked the proximal device.  I removed the targeting arm and then used perfect circle technique to place a lateral to medial distal interlocking screw.  Final fluoroscopic imaging was obtained.  The incisions were copiously irrigated.  A layered closure of 2-0 Vicryl, 3-0 Monocryl and Dermabond was used to close the skin.  Sterile dressings were applied.  The patient was then awoken from anesthesia and taken to the PACU in stable condition.  Post Op Plan/Instructions: Patient will be weightbearing as tolerated to left lower extremity.  She will receive postoperative Ancef.  She will be started on Lovenox for DVT prophylaxis.  We will have her mobilize with physical and Occupational Therapy.  I was present and performed the entire surgery.   Truitt Merle, MD [facility]

## 2020-10-11 NOTE — Interval H&P Note (Signed)
History and Physical Interval Note:  10/11/2020 7:44 AM  Melody White  has presented today for surgery, with the diagnosis of Left intertrochanteric femur fracture.  The various methods of treatment have been discussed with the patient and family. After consideration of risks, benefits and other options for treatment, the patient has consented to  Procedure(s): INTRAMEDULLARY (IM) NAIL INTERTROCHANTRIC (Left) as a surgical intervention.  The patient's history has been reviewed, patient examined, no change in status, stable for surgery.  I have reviewed the patient's chart and labs.  Questions were answered to the patient's satisfaction.     Caryn Bee P Ezell Poke

## 2020-10-11 NOTE — H&P (View-Only) (Signed)
Orthopaedic Trauma Service (OTS) Consult   Patient ID: Melody White MRN: [id number] DOB/AGE: 1950/06/23 70 y.o.  Reason for Consult:Left intertrochanteric femur fracture Referring Physician: Dr. Meridee ScoreMichael Butler, MD [facility]  HPI: Melody White is an 70 y.o. female who is being seen in consultation at the request of Dr. Charm BargesButler for evaluation of left intertrochanteric femur fracture.  The patient fell yesterday evening landed on her left hip.  She had immediate pain and deformity.  She was unable to bear weight.  She was brought to the emergency room where x-rays showed a left intertrochanteric femur fracture.  Due to the unstable nature of the injury I recommended proceeding with surgical fixation.  She was admitted to the family medicine service yesterday evening.  Patient was seen and evaluated in her room on [address] this morning.  Currently comfortable.  No complaints of pain anywhere else other than her leg.  She lives with her husband.  She ambulates without assist device.  She smokes about 4 cigarettes a day.  She smokes for multiple years but has cut back.  Denies any anticoagulation.  Denies any numbness or tingling in her left lower extremity.  She is otherwise comfortable.  She hurts significantly when she moves her leg.  The pain medication assist with the pain.  Past Medical History:  Diagnosis Date  . Hypertension   . Stroke (HCC)   . Thyroid disease     Past Surgical History:  Procedure Laterality Date  . ABDOMINAL HYSTERECTOMY    . bowel obstruction    . BREAST EXCISIONAL BIOPSY Right 20+ yrs ago   benign  . SHOULDER SURGERY      Family History  Problem Relation Age of Onset  . Arthritis Mother     Social History:  reports that she has been smoking cigarettes. She has a 25.00 pack-year smoking history. She has never used smokeless tobacco. She reports previous alcohol use. She reports previous drug use.  Allergies:  Allergies  Allergen Reactions  . Amoxicillin  Other (See Comments)    other   . Azithromycin Itching  . Clindamycin Other (See Comments)    Funny feeling other   . Codeine Itching and Other (See Comments)    other   . Fexofenadine Other (See Comments)    Funny feeling other   . Hydrocodone Bit-Homatrop Mbr Itching  . Sulfamethoxazole Nausea Only and Other (See Comments)    other     Medications:  No current facility-administered medications on file prior to encounter.   Current Outpatient Medications on File Prior to Encounter  Medication Sig Dispense Refill  . amitriptyline (ELAVIL) 25 MG tablet Take 25 mg by mouth at bedtime.    . Ascorbic Acid (VITAMIN C) 1000 MG tablet Take 1,000 mg by mouth daily.    Marland Kitchen. aspirin 325 MG EC tablet Take 325 mg by mouth daily.    . budesonide-formoterol (SYMBICORT) 160-4.5 MCG/ACT inhaler Inhale 2 puffs into the lungs 2 (two) times daily.    . diazepam (VALIUM) 10 MG tablet Take 10 mg by mouth at bedtime as needed for anxiety or sleep. Into the vagina every night as needed for pelvic floor spasms    . Echinacea 400 MG CAPS Take 400 mg by mouth daily.    Marland Kitchen. escitalopram (LEXAPRO) 20 MG tablet Take 20 mg by mouth daily.    . fluticasone (FLONASE) 50 MCG/ACT nasal spray Place 2 sprays into the nose daily as needed for allergies.    .Marland Kitchen  ibuprofen (ADVIL) 200 MG tablet Take 400 mg by mouth every 6 (six) hours as needed for mild pain.    Marland Kitchen levocetirizine (XYZAL) 5 MG tablet Take 1 tablet by mouth daily.    Marland Kitchen levothyroxine (SYNTHROID) 50 MCG tablet Take 50 mcg by mouth daily.    Marland Kitchen lisinopril (ZESTRIL) 10 MG tablet Take 10 mg by mouth daily.    . montelukast (SINGULAIR) 10 MG tablet Take 10 mg by mouth at bedtime.    . Multiple Vitamin (MULTIVITAMIN) capsule Take 1 capsule by mouth daily.    . rosuvastatin (CRESTOR) 10 MG tablet Take 10 mg by mouth daily.    Marland Kitchen zinc gluconate 50 MG tablet Take 50 mg by mouth daily.    Marland Kitchen aspirin EC 81 MG tablet Take 1 tablet (81 mg total) by mouth daily. (Patient not  taking: Reported on 10/10/2020) 90 tablet 3  . doxycycline (VIBRA-TABS) 100 MG tablet Take 1 tablet (100 mg total) by mouth every 12 (twelve) hours. (Patient not taking: No sig reported) 2 tablet 0  . methylPREDNISolone (MEDROL DOSEPAK) 4 MG TBPK tablet Advised to take it as directed. (Patient not taking: No sig reported) 21 tablet 0    ROS: Constitutional: No fever or chills Vision: No changes in vision ENT: No difficulty swallowing CV: No chest pain Pulm: No SOB or wheezing GI: No nausea or vomiting GU: No urgency or inability to hold urine Skin: No poor wound healing Neurologic: No numbness or tingling Psychiatric: No depression or anxiety Heme: No bruising Allergic: No reaction to medications or food   Exam: Blood pressure 102/60, pulse 67, temperature 97.7 F (36.5 C), temperature source Oral, resp. rate 18, height 5\' 6"  (1.676 m), weight 52.6 kg, SpO2 98 %. General: No acute distress Orientation: Awake alert and oriented x3 Mood and Affect: Cooperative and pleasant Gait: Unable to assess due to her fracture Coordination and balance: Within normal limits  Left lower extremity: Buck's traction is in place.  Deformity about the thigh.  Pain with any attempted range of motion.  No obvious deformity about the knee.  Compartments are soft compressible.  Motor and sensory function is intact to all nerve distributions.  She is warm well perfused foot with cap refill that is within normal limits.  Right lower extremity: Skin without lesions. No tenderness to palpation. Full painless ROM, full strength in each muscle groups without evidence of instability.   Medical Decision Making: Data: Imaging: X-rays of the left hip and pelvis show a comminuted proximal femur fracture with significant shortening and varus angulation.  Labs:  Results for orders placed or performed during the hospital encounter of 10/10/20 (from the past 24 hour(s))  Basic metabolic panel     Status: Abnormal    Collection Time: 10/10/20  5:44 PM  Result Value Ref Range   Sodium 137 135 - 145 mmol/L   Potassium 3.8 3.5 - 5.1 mmol/L   Chloride 103 98 - 111 mmol/L   CO2 24 22 - 32 mmol/L   Glucose, Bld 85 70 - 99 mg/dL   BUN 16 8 - 23 mg/dL   Creatinine, Ser 10/12/20 0.44 - 1.00 mg/dL   Calcium 8.5 (L) 8.9 - 10.3 mg/dL   GFR, Estimated 6.21 >30 mL/min   Anion gap 10 5 - 15  CBC WITH DIFFERENTIAL     Status: None   Collection Time: 10/10/20  5:44 PM  Result Value Ref Range   WBC 6.9 4.0 - 10.5 K/uL   RBC 4.41  3.87 - 5.11 MIL/uL   Hemoglobin 14.4 12.0 - 15.0 g/dL   HCT 09.2 33.0 - 07.6 %   MCV 96.6 80.0 - 100.0 fL   MCH 32.7 26.0 - 34.0 pg   MCHC 33.8 30.0 - 36.0 g/dL   RDW 22.6 33.3 - 54.5 %   Platelets 163 150 - 400 K/uL   nRBC 0.0 0.0 - 0.2 %   Neutrophils Relative % 65 %   Neutro Abs 4.5 1.7 - 7.7 K/uL   Lymphocytes Relative 22 %   Lymphs Abs 1.5 0.7 - 4.0 K/uL   Monocytes Relative 8 %   Monocytes Absolute 0.6 0.1 - 1.0 K/uL   Eosinophils Relative 4 %   Eosinophils Absolute 0.3 0.0 - 0.5 K/uL   Basophils Relative 1 %   Basophils Absolute 0.0 0.0 - 0.1 K/uL   Immature Granulocytes 0 %   Abs Immature Granulocytes 0.03 0.00 - 0.07 K/uL  Protime-INR     Status: None   Collection Time: 10/10/20  5:44 PM  Result Value Ref Range   Prothrombin Time 13.9 11.4 - 15.2 seconds   INR 1.1 0.8 - 1.2  Type and screen [facility]     Status: None   Collection Time: 10/10/20  6:01 PM  Result Value Ref Range   ABO/RH(D) O POS    Antibody Screen NEG    Sample Expiration      10/13/2020,2359 Performed at [facility], [address], [place], [place] [address]   Resp Panel by RT-PCR (Flu A&B, Covid) Nasopharyngeal Swab     Status: None   Collection Time: 10/10/20  7:49 PM   Specimen: Nasopharyngeal Swab; Nasopharyngeal(NP) swabs in vial transport medium  Result Value Ref Range   SARS Coronavirus 2 by RT PCR NEGATIVE NEGATIVE   Influenza A by PCR NEGATIVE NEGATIVE    Influenza B by PCR NEGATIVE NEGATIVE  ABO/Rh     Status: None   Collection Time: 10/11/20 12:52 AM  Result Value Ref Range   ABO/RH(D)      O POS Performed at [facility], [address], [place], [place] [address]   Hepatic function panel     Status: Abnormal   Collection Time: 10/11/20 12:52 AM  Result Value Ref Range   Total Protein 5.5 (L) 6.5 - 8.1 g/dL   Albumin 3.1 (L) 3.5 - 5.0 g/dL   AST 29 15 - 41 U/L   ALT 28 0 - 44 U/L   Alkaline Phosphatase 53 38 - 126 U/L   Total Bilirubin 0.6 0.3 - 1.2 mg/dL   Bilirubin, Direct <4.2 0.0 - 0.2 mg/dL   Indirect Bilirubin NOT CALCULATED 0.3 - 0.9 mg/dL  T4, free     Status: None   Collection Time: 10/11/20 12:52 AM  Result Value Ref Range   Free T4 0.88 0.61 - 1.12 ng/dL  CBC     Status: Abnormal   Collection Time: 10/11/20 12:52 AM  Result Value Ref Range   WBC 8.8 4.0 - 10.5 K/uL   RBC 4.17 3.87 - 5.11 MIL/uL   Hemoglobin 13.6 12.0 - 15.0 g/dL   HCT 87.6 81.1 - 57.2 %   MCV 98.1 80.0 - 100.0 fL   MCH 32.6 26.0 - 34.0 pg   MCHC 33.3 30.0 - 36.0 g/dL   RDW 62.0 35.5 - 97.4 %   Platelets 149 (L) 150 - 400 K/uL   nRBC 0.0 0.0 - 0.2 %  Basic metabolic panel  Status: Abnormal   Collection Time: 10/11/20 12:52 AM  Result Value Ref Range   Sodium 136 135 - 145 mmol/L   Potassium 3.8 3.5 - 5.1 mmol/L   Chloride 104 98 - 111 mmol/L   CO2 24 22 - 32 mmol/L   Glucose, Bld 107 (H) 70 - 99 mg/dL   BUN 14 8 - 23 mg/dL   Creatinine, Ser 0.76 0.44 - 1.00 mg/dL   Calcium 8.5 (L) 8.9 - 10.3 mg/dL   GFR, Estimated >80 >88 mL/min   Anion gap 8 5 - 15    Imaging or Labs ordered: None  Medical history and chart was reviewed and case discussed with medical provider.  Assessment/Plan: 70 year old female with a history of COPD, stroke, hypertension and hypothyroidism presents with a left proximal femur fracture  Due to the unstable nature of her injury I recommend proceeding with cephalomedullary nailing of her left hip.  Risks  and benefits were discussed with the patient.  I also discussed with him over the phone with her sister.  They both agree to proceed with surgery.  Risks included but not limited to bleeding, infection, malunion, nonunion, hardware failure, hardware irritation, nerve or blood vessel injury, DVT, even the possibility anesthetic complications.  The patient agreed to proceed with surgery and consent was obtained.  Roby Lofts, MD [facility] [phone] (office) [email or web address]

## 2020-10-11 NOTE — Transfer of Care (Signed)
Immediate Anesthesia Transfer of Care Note  Patient: Melody White  Procedure(s) Performed: INTRAMEDULLARY (IM) NAIL INTERTROCHANTRIC (Left )  Patient Location: PACU  Anesthesia Type:General  Level of Consciousness: awake and drowsy  Airway & Oxygen Therapy: Patient Spontanous Breathing and Patient connected to face mask oxygen  Post-op Assessment: Report given to RN and Post -op Vital signs reviewed and stable  Post vital signs: Reviewed and stable  Last Vitals:  Vitals Value Taken Time  BP 102/58 10/11/20 0954  Temp 37.1 C 10/11/20 0954  Pulse 82 10/11/20 0955  Resp 22 10/11/20 0955  SpO2 95 % 10/11/20 0955    Last Pain:  Vitals:   10/11/20 0954  TempSrc:   PainSc: 0-No pain         Complications: No complications documented.

## 2020-10-11 NOTE — Progress Notes (Signed)
Family Medicine Teaching Service Daily Progress Note Intern Pager: [phone]  Patient name: Melody White Medical record number: [id number] Date of birth: 25-Oct-1950 Age: 70 y.o. Gender: female  Primary Care Provider: Richmond Campbell., PA-C Consultants: Ortho Code Status: Full  Pt Overview and Major Events to Date:  10/11/2020: Admitted to FPTS  Assessment and Plan: Melody White is a 70 y.o. female presented after fall. PMH is significant for COPD, CVA, HTN, HLD, Hypothyroidism and Depression.   Left Introchanteric Hip Fracture Patient feels comfortable this morning and aware about her surgery.  Buck's traction was in place.  Normal sensation, able to move toes but pain with any attempted range of motion, deformity in thigh but no deformity noticed around the knee on the left lower extremity.  Ortho present in the room during evaluation and the plan is to do a cephalomedullary nailing of her left hip. -- Appreciate Ortho's assistance and recs -- N.p.o. -- Vital signs q4hr -- Held all medications due to scheduled surgery  Hx of COPD Patient denies any difficulty breathing.  Was satting well on 2 L oxygen. --Dulera twice daily --Pulse ox --Keep oxygen sats more than 88% --Recommend outpatient low-dose CT given smoking history  Hx of CVA  Chart reviewed, last outpatient visit 10/01/2020 shows home medication aspirin 81 mg -- Hold aspirin  HTN Home meds: Lisinopril 10 mg daily --Hold Lisinopril   Hypothyroidism Home meds: Synthroid 50 MCG --c/w  Synthroid  HLD Home meds: Rosuvastatin 10 mg -- Hold rosuvastatin    Depression Meds: Amitriptyline 25 mg nightly Lexapro 20 mg --c/w amitriptyline and Lexapro  FEN/GI: NPO PPx: SCDs   Status is: Inpatient  Remains inpatient appropriate because:Inpatient level of care appropriate due to severity of illness   Dispo: The patient is from: Home              Anticipated d/c is to: Home              Patient currently is  not medically stable to d/c.   Difficult to place patient No        Subjective:  Patient evaluated at bedside this morning.  Feels comfortable and aware about her surgery.  Denies any complaints.  Objective: Temp:  [97.7 F (36.5 C)-99.3 F (37.4 C)] 97.7 F (36.5 C) (05/30 0549) Pulse Rate:  [67-75] 67 (05/30 0549) Resp:  [8-18] 18 (05/30 0549) BP: (102-138)/(60-84) 102/60 (05/30 0549) SpO2:  [89 %-98 %] 98 % (05/30 0549) Weight:  [116 lb (52.6 kg)] 116 lb (52.6 kg) (05/29 1740) Physical Exam: General: Elderly female lying in bed with traction on her left leg Cardiovascular: Regular rate and rhythm Respiratory: Clear to auscultation bilaterally Abdomen: Soft, nondistended, nontender, bowel sounds present Extremities: Left leg Buck's traction in place  Laboratory: Recent Labs  Lab 10/10/20 1744 10/11/20 0052  WBC 6.9 8.8  HGB 14.4 13.6  HCT 42.6 40.9  PLT 163 149*   Recent Labs  Lab 10/10/20 1744 10/11/20 0052  NA 137 136  K 3.8 3.8  CL 103 104  CO2 24 24  BUN 16 14  CREATININE 0.94 0.87  CALCIUM 8.5* 8.5*  PROT  --  5.5*  BILITOT  --  0.6  ALKPHOS  --  53  ALT  --  28  AST  --  29  GLUCOSE 85 107*    Imaging/Diagnostic Tests: DG Chest 1 View  Result Date: 10/10/2020 CLINICAL DATA:  Recent fall with known left hip fracture, initial encounter EXAM:  CHEST  1 VIEW COMPARISON:  09/20/2020 FINDINGS: Cardiac shadow is within normal limits. The lungs are hyperinflated. No focal infiltrate or sizable effusion is seen. No bony abnormality is noted. IMPRESSION: COPD without acute abnormality. Electronically Signed   By: Alcide Clever M.D.   On: 10/10/2020 19:17   DG Hip Unilat With Pelvis 2-3 Views Left  Result Date: 10/10/2020 CLINICAL DATA:  Recent fall with hip pain, initial encounter EXAM: DG HIP (WITH OR WITHOUT PELVIS) 2-3V LEFT COMPARISON:  None. FINDINGS: Pelvic ring is intact. Comminuted intratrochanteric fracture is noted with impaction and angulation  at the fracture site. Degenerative changes of the hip joints are noted bilaterally. No soft tissue abnormality is seen. IMPRESSION: Left intratrochanteric fracture as described. Electronically Signed   By: Alcide Clever M.D.   On: 10/10/2020 19:16    Eveline Sauve, Geralynn Rile, MD 10/11/2020, 9:08 AM PGY-1, [facility] Family Medicine FPTS Intern pager: [phone], text pages welcome

## 2020-10-11 NOTE — Anesthesia Preprocedure Evaluation (Addendum)
Anesthesia Evaluation  Patient identified by MRN, date of birth, ID band Patient awake    Reviewed: Allergy & Precautions, NPO status , Patient's Chart, lab work & pertinent test results  Airway Mallampati: II  TM Distance: >3 FB Neck ROM: Full    Dental  (+) Dental Advisory Given, Edentulous Upper, Edentulous Lower   Pulmonary COPD,  COPD inhaler, Current Smoker and Patient abstained from smoking.,    Pulmonary exam normal breath sounds clear to auscultation       Cardiovascular hypertension, Pt. on medications Normal cardiovascular exam Rhythm:Regular Rate:Normal     Neuro/Psych PSYCHIATRIC DISORDERS Depression CVA    GI/Hepatic negative GI ROS, Neg liver ROS,   Endo/Other  Hypothyroidism   Renal/GU negative Renal ROS     Musculoskeletal Left intertrochanteric femur fracture   Abdominal   Peds  Hematology negative hematology ROS (+)   Anesthesia Other Findings Day of surgery medications reviewed with the patient.  Reproductive/Obstetrics                            Anesthesia Physical Anesthesia Plan  ASA: III  Anesthesia Plan: General   Post-op Pain Management:    Induction: Intravenous  PONV Risk Score and Plan: 2 and Dexamethasone and Ondansetron  Airway Management Planned: Oral ETT  Additional Equipment:   Intra-op Plan:   Post-operative Plan: Extubation in OR  Informed Consent: I have reviewed the patients History and Physical, chart, labs and discussed the procedure including the risks, benefits and alternatives for the proposed anesthesia with the patient or authorized representative who has indicated his/her understanding and acceptance.     Dental advisory given  Plan Discussed with: CRNA  Anesthesia Plan Comments:         Anesthesia Quick Evaluation

## 2020-10-11 NOTE — TOC Progression Note (Signed)
Transition of Care [facility]) - Progression Note    Patient Details  Name: Melody White MRN: [id number] Date of Birth: 06-Feb-1951  Transition of Care [facility]) CM/SW Contact  Ralene Bathe, LCSWA Phone Number: 10/11/2020, 10:52 AM  Clinical Narrative:    CSW following patient for any d/c planning needs once medically stable.  Pending PT/OT ass./eval  Cleon Gustin, MSW, LCSWA       Expected Discharge Plan and Services                                                 Social Determinants of Health (SDOH) Interventions    Readmission Risk Interventions No flowsheet data found.

## 2020-10-11 NOTE — Hospital Course (Addendum)
Melody White is a 70 y.o. female presenting with fall. PMH is significant for COPD, CVA, HTN, HLD, Hypothyroidism and Depression.     Left Introchanteric Hip Fracture s/p Cephalomedullary nail Experienced fall on 5/29 and presented with left hip pain found to have a left intertochanteric fracture. She was hemodynamically stable and no sign of neurovascular compromise on physical exam. Orthopedics followed and Bucks traction was placed.  Cephalomedullary nail on 10/11/2020 along with instruction of weightbearing as tolerated. At discharge she was started on vitamin D and calcium supplementation. Eliquis was started for VTE prophylaxis.   Hyperglycemia Placed on moderate SSI scale during admission. Glucoses were <120 in the 24 hours prior to discharge.   Chronic hypoxic respiratory failure, COPD She was recently discharged on 2 L home oxygen during previous admission. She was discharged on 2L nasal cannula.   Hypertension  Patient remained normotensive during admission and home medications were held.    All chronic conditions stable during admission.

## 2020-10-11 NOTE — Plan of Care (Signed)

## 2020-10-11 NOTE — Progress Notes (Signed)
Patient on phone with husband when going to examine.  Indicates pain being well controlled and denies any issues at this time.     Roi Jafari, MD 10/11/2020, 10:03 PM PGY-1, [facility] pager [phone] 

## 2020-10-11 NOTE — Evaluation (Signed)
Physical Therapy Evaluation Patient Details Name: Melody White MRN: [id number] DOB: [birth year] Today's Date: 10/11/2020   History of Present Illness  Pt is a 70 y.o. F who presents with left intertrochanteric femur fracture s/p cephalomedullary nailing 10/11/20. Significant PMH: HTN, stroke.  Clinical Impression  Prior to admission, pt lives with her spouse and is independent. Pt presents with decreased functional mobility secondary to L hip pain and weakness. Requiring min-mod assist for mobility; able to take pivotal steps from the bed to chair using a walker. Limited weightbearing through LLE. Initiated HEP for strengthening/ROM and applied ice post session. Suspect good progress and pt has excellent family support. Will continue to progress mobility as tolerated.     Follow Up Recommendations Home health PT;Supervision for mobility/OOB    Equipment Recommendations  Rolling walker with 5" wheels;3in1 (PT);Wheelchair (measurements PT);Wheelchair cushion (measurements PT) (elevating legrests)    Recommendations for Other Services       Precautions / Restrictions Precautions Precautions: Fall;Other (comment) Precaution Comments: watch O2 sats Restrictions Weight Bearing Restrictions: No      Mobility  Bed Mobility Overal bed mobility: Needs Assistance Bed Mobility: Supine to Sit     Supine to sit: Mod assist     General bed mobility comments: Cues for BLE progression to edge of bed and use of rail, assist of LLE, bed pad to scoot hips forward to edge of bed    Transfers Overall transfer level: Needs assistance Equipment used: Rolling walker (2 wheeled) Transfers: Sit to/from Stand Sit to Stand: Min assist         General transfer comment: MinA to rise to stand, cues for hand placement, pt utilizing narrower BOS  Ambulation/Gait Ambulation/Gait assistance: Min assist Gait Distance (Feet): 3 Feet Assistive device: Rolling walker (2 wheeled) Gait Pattern/deviations:  Step-to pattern;Decreased stance time - left;Decreased weight shift to left;Antalgic;Narrow base of support Gait velocity: decreased Gait velocity interpretation: <1.31 ft/sec, indicative of household ambulator General Gait Details: Pivotal steps to chair, minA for balance, cues for sequencing/technique. Pt with limited weightbearing through LLE  Stairs            Wheelchair Mobility    Modified Rankin (Stroke Patients Only)       Balance Overall balance assessment: Needs assistance Sitting-balance support: Feet supported Sitting balance-Leahy Scale: Fair     Standing balance support: Bilateral upper extremity supported Standing balance-Leahy Scale: Poor Standing balance comment: heavily reliant on external support                             Pertinent Vitals/Pain Pain Assessment: Faces Faces Pain Scale: Hurts even more Pain Location: L hip Pain Descriptors / Indicators: Grimacing;Operative site guarding;Tingling Pain Intervention(s): Limited activity within patient's tolerance;Monitored during session;Premedicated before session;Ice applied    Home Living Family/patient expects to be discharged to:: Private residence Living Arrangements: Spouse/significant other Available Help at Discharge: Family Type of Home: House Home Access: Stairs to enter Entrance Stairs-Rails: None Secretary/administrator of Steps: 2 Home Layout: One level Home Equipment: Shower seat      Prior Function Level of Independence: Independent         Comments: Reports being fairly sedentary, does not work     Higher education careers adviser   Dominant Hand: Right    Extremity/Trunk Assessment   Upper Extremity Assessment Upper Extremity Assessment: Defer to OT evaluation    Lower Extremity Assessment Lower Extremity Assessment: LLE deficits/detail LLE Deficits / Details: Femur fx  s/p nailing. Grossly 2/5 strength       Communication   Communication: No difficulties  Cognition  Arousal/Alertness: Awake/alert Behavior During Therapy: WFL for tasks assessed/performed Overall Cognitive Status: Within Functional Limits for tasks assessed                                        General Comments      Exercises General Exercises - Lower Extremity Ankle Circles/Pumps: Left;10 reps;Seated Long Arc Quad: AAROM;Left;10 reps;Seated   Assessment/Plan    PT Assessment Patient needs continued PT services  PT Problem List Decreased mobility;Decreased activity tolerance       PT Treatment Interventions Gait training;Therapeutic exercise;Balance training;Therapeutic activities;Patient/family education;Functional mobility training    PT Goals (Current goals can be found in the Care Plan section)  Acute Rehab PT Goals Patient Stated Goal: be able to walk PT Goal Formulation: With patient Time For Goal Achievement: 10/25/20 Potential to Achieve Goals: Good    Frequency Min 5X/week   Barriers to discharge        Co-evaluation               AM-PAC PT "6 Clicks" Mobility  Outcome Measure Help needed turning from your back to your side while in a flat bed without using bedrails?: A Little Help needed moving from lying on your back to sitting on the side of a flat bed without using bedrails?: A Lot Help needed moving to and from a bed to a chair (including a wheelchair)?: A Little Help needed standing up from a chair using your arms (e.g., wheelchair or bedside chair)?: A Little Help needed to walk in hospital room?: A Little Help needed climbing 3-5 steps with a railing? : A Lot 6 Click Score: 16    End of Session Equipment Utilized During Treatment: Gait belt;Oxygen Activity Tolerance: Patient tolerated treatment well Patient left: in chair;with call bell/phone within reach Nurse Communication: Mobility status PT Visit Diagnosis: Difficulty in walking, not elsewhere classified (R26.2);Pain Pain - Right/Left: Left Pain - part of body:  Hip    Time: 3009-2330 PT Time Calculation (min) (ACUTE ONLY): 24 min   Charges:   PT Evaluation $PT Eval Low Complexity: 1 Low PT Treatments $Gait Training: 8-22 mins        Lillia Pauls, PT, DPT Acute Rehabilitation Services Pager [phone] Office [phone]   Norval Morton 10/11/2020, 4:54 PM

## 2020-10-11 NOTE — Anesthesia Postprocedure Evaluation (Signed)
Anesthesia Post Note  Patient: Melody White  Procedure(s) Performed: INTRAMEDULLARY (IM) NAIL INTERTROCHANTRIC (Left )     Patient location during evaluation: PACU Anesthesia Type: General Level of consciousness: awake and alert Pain management: pain level controlled Vital Signs Assessment: post-procedure vital signs reviewed and stable Respiratory status: spontaneous breathing, nonlabored ventilation, respiratory function stable and patient connected to nasal cannula oxygen Cardiovascular status: blood pressure returned to baseline and stable Postop Assessment: no apparent nausea or vomiting Anesthetic complications: no   No complications documented.  Last Vitals:  Vitals:   10/11/20 1200 10/11/20 1412  BP:  109/64  Pulse: 73 72  Resp: 18 17  Temp: 36.9 C 36.6 C  SpO2: 96% 96%    Last Pain:  Vitals:   10/11/20 1412  TempSrc: Oral  PainSc:                  Cecile Hearing

## 2020-10-11 NOTE — Anesthesia Procedure Notes (Signed)
Procedure Name: Intubation Date/Time: 10/11/2020 7:54 AM Performed by: Trinna Post., CRNA Pre-anesthesia Checklist: Patient identified, Emergency Drugs available, Suction available, Patient being monitored and Timeout performed Patient Re-evaluated:Patient Re-evaluated prior to induction Oxygen Delivery Method: Circle system utilized Preoxygenation: Pre-oxygenation with 100% oxygen Induction Type: IV induction Ventilation: Mask ventilation without difficulty Laryngoscope Size: Mac and 3 Grade View: Grade I Tube type: Oral Tube size: 7.0 mm Number of attempts: 1 Airway Equipment and Method: Stylet Placement Confirmation: ETT inserted through vocal cords under direct vision,  positive ETCO2 and breath sounds checked- equal and bilateral Secured at: 23 cm Tube secured with: Tape Dental Injury: Teeth and Oropharynx as per pre-operative assessment

## 2020-10-11 NOTE — Consult Note (Signed)
Orthopaedic Trauma Service (OTS) Consult   Patient ID: Melody White MRN: [id number] DOB/AGE: 01/25/1951 70 y.o.  Reason for Consult:Left intertrochanteric femur fracture Referring Physician: Dr. Michael Butler, MD [facility]  HPI: Melody White is an 70 y.o. female who is being seen in consultation at the request of Dr. Butler for evaluation of left intertrochanteric femur fracture.  The patient fell yesterday evening landed on her left hip.  She had immediate pain and deformity.  She was unable to bear weight.  She was brought to the emergency room where x-rays showed a left intertrochanteric femur fracture.  Due to the unstable nature of the injury I recommended proceeding with surgical fixation.  She was admitted to the family medicine service yesterday evening.  Patient was seen and evaluated in her room on 5 N. this morning.  Currently comfortable.  No complaints of pain anywhere else other than her leg.  She lives with her husband.  She ambulates without assist device.  She smokes about 4 cigarettes a day.  She smokes for multiple years but has cut back.  Denies any anticoagulation.  Denies any numbness or tingling in her left lower extremity.  She is otherwise comfortable.  She hurts significantly when she moves her leg.  The pain medication assist with the pain.  Past Medical History:  Diagnosis Date  . Hypertension   . Stroke (HCC)   . Thyroid disease     Past Surgical History:  Procedure Laterality Date  . ABDOMINAL HYSTERECTOMY    . bowel obstruction    . BREAST EXCISIONAL BIOPSY Right 20+ yrs ago   benign  . SHOULDER SURGERY      Family History  Problem Relation Age of Onset  . Arthritis Mother     Social History:  reports that she has been smoking cigarettes. She has a 25.00 pack-year smoking history. She has never used smokeless tobacco. She reports previous alcohol use. She reports previous drug use.  Allergies:  Allergies  Allergen Reactions  . Amoxicillin  Other (See Comments)    other   . Azithromycin Itching  . Clindamycin Other (See Comments)    Funny feeling other   . Codeine Itching and Other (See Comments)    other   . Fexofenadine Other (See Comments)    Funny feeling other   . Hydrocodone Bit-Homatrop Mbr Itching  . Sulfamethoxazole Nausea Only and Other (See Comments)    other     Medications:  No current facility-administered medications on file prior to encounter.   Current Outpatient Medications on File Prior to Encounter  Medication Sig Dispense Refill  . amitriptyline (ELAVIL) 25 MG tablet Take 25 mg by mouth at bedtime.    . Ascorbic Acid (VITAMIN C) 1000 MG tablet Take 1,000 mg by mouth daily.    . aspirin 325 MG EC tablet Take 325 mg by mouth daily.    . budesonide-formoterol (SYMBICORT) 160-4.5 MCG/ACT inhaler Inhale 2 puffs into the lungs 2 (two) times daily.    . diazepam (VALIUM) 10 MG tablet Take 10 mg by mouth at bedtime as needed for anxiety or sleep. Into the vagina every night as needed for pelvic floor spasms    . Echinacea 400 MG CAPS Take 400 mg by mouth daily.    . escitalopram (LEXAPRO) 20 MG tablet Take 20 mg by mouth daily.    . fluticasone (FLONASE) 50 MCG/ACT nasal spray Place 2 sprays into the nose daily as needed for allergies.    .   ibuprofen (ADVIL) 200 MG tablet Take 400 mg by mouth every 6 (six) hours as needed for mild pain.    Marland Kitchen levocetirizine (XYZAL) 5 MG tablet Take 1 tablet by mouth daily.    Marland Kitchen levothyroxine (SYNTHROID) 50 MCG tablet Take 50 mcg by mouth daily.    Marland Kitchen lisinopril (ZESTRIL) 10 MG tablet Take 10 mg by mouth daily.    . montelukast (SINGULAIR) 10 MG tablet Take 10 mg by mouth at bedtime.    . Multiple Vitamin (MULTIVITAMIN) capsule Take 1 capsule by mouth daily.    . rosuvastatin (CRESTOR) 10 MG tablet Take 10 mg by mouth daily.    Marland Kitchen zinc gluconate 50 MG tablet Take 50 mg by mouth daily.    Marland Kitchen aspirin EC 81 MG tablet Take 1 tablet (81 mg total) by mouth daily. (Patient not  taking: Reported on 10/10/2020) 90 tablet 3  . doxycycline (VIBRA-TABS) 100 MG tablet Take 1 tablet (100 mg total) by mouth every 12 (twelve) hours. (Patient not taking: No sig reported) 2 tablet 0  . methylPREDNISolone (MEDROL DOSEPAK) 4 MG TBPK tablet Advised to take it as directed. (Patient not taking: No sig reported) 21 tablet 0    ROS: Constitutional: No fever or chills Vision: No changes in vision ENT: No difficulty swallowing CV: No chest pain Pulm: No SOB or wheezing GI: No nausea or vomiting GU: No urgency or inability to hold urine Skin: No poor wound healing Neurologic: No numbness or tingling Psychiatric: No depression or anxiety Heme: No bruising Allergic: No reaction to medications or food   Exam: Blood pressure 102/60, pulse 67, temperature 97.7 F (36.5 C), temperature source Oral, resp. rate 18, height 5\' 6"  (1.676 m), weight 52.6 kg, SpO2 98 %. General: No acute distress Orientation: Awake alert and oriented x3 Mood and Affect: Cooperative and pleasant Gait: Unable to assess due to her fracture Coordination and balance: Within normal limits  Left lower extremity: Buck's traction is in place.  Deformity about the thigh.  Pain with any attempted range of motion.  No obvious deformity about the knee.  Compartments are soft compressible.  Motor and sensory function is intact to all nerve distributions.  She is warm well perfused foot with cap refill that is within normal limits.  Right lower extremity: Skin without lesions. No tenderness to palpation. Full painless ROM, full strength in each muscle groups without evidence of instability.   Medical Decision Making: Data: Imaging: X-rays of the left hip and pelvis show a comminuted proximal femur fracture with significant shortening and varus angulation.  Labs:  Results for orders placed or performed during the hospital encounter of 10/10/20 (from the past 24 hour(s))  Basic metabolic panel     Status: Abnormal    Collection Time: 10/10/20  5:44 PM  Result Value Ref Range   Sodium 137 135 - 145 mmol/L   Potassium 3.8 3.5 - 5.1 mmol/L   Chloride 103 98 - 111 mmol/L   CO2 24 22 - 32 mmol/L   Glucose, Bld 85 70 - 99 mg/dL   BUN 16 8 - 23 mg/dL   Creatinine, Ser 10/12/20 0.44 - 1.00 mg/dL   Calcium 8.5 (L) 8.9 - 10.3 mg/dL   GFR, Estimated 6.21 >30 mL/min   Anion gap 10 5 - 15  CBC WITH DIFFERENTIAL     Status: None   Collection Time: 10/10/20  5:44 PM  Result Value Ref Range   WBC 6.9 4.0 - 10.5 K/uL   RBC 4.41  3.87 - 5.11 MIL/uL   Hemoglobin 14.4 12.0 - 15.0 g/dL   HCT 09.2 33.0 - 07.6 %   MCV 96.6 80.0 - 100.0 fL   MCH 32.7 26.0 - 34.0 pg   MCHC 33.8 30.0 - 36.0 g/dL   RDW 22.6 33.3 - 54.5 %   Platelets 163 150 - 400 K/uL   nRBC 0.0 0.0 - 0.2 %   Neutrophils Relative % 65 %   Neutro Abs 4.5 1.7 - 7.7 K/uL   Lymphocytes Relative 22 %   Lymphs Abs 1.5 0.7 - 4.0 K/uL   Monocytes Relative 8 %   Monocytes Absolute 0.6 0.1 - 1.0 K/uL   Eosinophils Relative 4 %   Eosinophils Absolute 0.3 0.0 - 0.5 K/uL   Basophils Relative 1 %   Basophils Absolute 0.0 0.0 - 0.1 K/uL   Immature Granulocytes 0 %   Abs Immature Granulocytes 0.03 0.00 - 0.07 K/uL  Protime-INR     Status: None   Collection Time: 10/10/20  5:44 PM  Result Value Ref Range   Prothrombin Time 13.9 11.4 - 15.2 seconds   INR 1.1 0.8 - 1.2  Type and screen [facility]     Status: None   Collection Time: 10/10/20  6:01 PM  Result Value Ref Range   ABO/RH(D) O POS    Antibody Screen NEG    Sample Expiration      10/13/2020,2359 Performed at [facility], [address], [place], [place] [address]   Resp Panel by RT-PCR (Flu A&B, Covid) Nasopharyngeal Swab     Status: None   Collection Time: 10/10/20  7:49 PM   Specimen: Nasopharyngeal Swab; Nasopharyngeal(NP) swabs in vial transport medium  Result Value Ref Range   SARS Coronavirus 2 by RT PCR NEGATIVE NEGATIVE   Influenza A by PCR NEGATIVE NEGATIVE    Influenza B by PCR NEGATIVE NEGATIVE  ABO/Rh     Status: None   Collection Time: 10/11/20 12:52 AM  Result Value Ref Range   ABO/RH(D)      O POS Performed at [facility], [address], [place], [place] [address]   Hepatic function panel     Status: Abnormal   Collection Time: 10/11/20 12:52 AM  Result Value Ref Range   Total Protein 5.5 (L) 6.5 - 8.1 g/dL   Albumin 3.1 (L) 3.5 - 5.0 g/dL   AST 29 15 - 41 U/L   ALT 28 0 - 44 U/L   Alkaline Phosphatase 53 38 - 126 U/L   Total Bilirubin 0.6 0.3 - 1.2 mg/dL   Bilirubin, Direct <4.2 0.0 - 0.2 mg/dL   Indirect Bilirubin NOT CALCULATED 0.3 - 0.9 mg/dL  T4, free     Status: None   Collection Time: 10/11/20 12:52 AM  Result Value Ref Range   Free T4 0.88 0.61 - 1.12 ng/dL  CBC     Status: Abnormal   Collection Time: 10/11/20 12:52 AM  Result Value Ref Range   WBC 8.8 4.0 - 10.5 K/uL   RBC 4.17 3.87 - 5.11 MIL/uL   Hemoglobin 13.6 12.0 - 15.0 g/dL   HCT 87.6 81.1 - 57.2 %   MCV 98.1 80.0 - 100.0 fL   MCH 32.6 26.0 - 34.0 pg   MCHC 33.3 30.0 - 36.0 g/dL   RDW 62.0 35.5 - 97.4 %   Platelets 149 (L) 150 - 400 K/uL   nRBC 0.0 0.0 - 0.2 %  Basic metabolic panel  Status: Abnormal   Collection Time: 10/11/20 12:52 AM  Result Value Ref Range   Sodium 136 135 - 145 mmol/L   Potassium 3.8 3.5 - 5.1 mmol/L   Chloride 104 98 - 111 mmol/L   CO2 24 22 - 32 mmol/L   Glucose, Bld 107 (H) 70 - 99 mg/dL   BUN 14 8 - 23 mg/dL   Creatinine, Ser 0.76 0.44 - 1.00 mg/dL   Calcium 8.5 (L) 8.9 - 10.3 mg/dL   GFR, Estimated >80 >88 mL/min   Anion gap 8 5 - 15    Imaging or Labs ordered: None  Medical history and chart was reviewed and case discussed with medical provider.  Assessment/Plan: 70 year old female with a history of COPD, stroke, hypertension and hypothyroidism presents with a left proximal femur fracture  Due to the unstable nature of her injury I recommend proceeding with cephalomedullary nailing of her left hip.  Risks  and benefits were discussed with the patient.  I also discussed with him over the phone with her sister.  They both agree to proceed with surgery.  Risks included but not limited to bleeding, infection, malunion, nonunion, hardware failure, hardware irritation, nerve or blood vessel injury, DVT, even the possibility anesthetic complications.  The patient agreed to proceed with surgery and consent was obtained.  Roby Lofts, MD [facility] [phone] (office) [email or web address]

## 2020-10-11 NOTE — Progress Notes (Signed)
Phone conversation with Dr. Jena Gauss: -- Dr. Jena Gauss confirmed that patient will be weightbearing as tolerated to the left lower extremity.  As per him, his PA ordered PT/OT and patient should be mobilized. -- Also, patient will be started on Lovenox for DVT prophylaxis tomorrow.  Arnoldo Lenis, MD PGY-1, Resident

## 2020-10-12 ENCOUNTER — Encounter (HOSPITAL_COMMUNITY): Payer: Self-pay | Admitting: Student in an Organized Health Care Education/Training Program

## 2020-10-12 ENCOUNTER — Other Ambulatory Visit: Payer: Self-pay

## 2020-10-12 DIAGNOSIS — I151 Hypertension secondary to other renal disorders: Secondary | ICD-10-CM | POA: Diagnosis not present

## 2020-10-12 DIAGNOSIS — J449 Chronic obstructive pulmonary disease, unspecified: Secondary | ICD-10-CM | POA: Diagnosis not present

## 2020-10-12 DIAGNOSIS — N2889 Other specified disorders of kidney and ureter: Secondary | ICD-10-CM

## 2020-10-12 DIAGNOSIS — S72002A Fracture of unspecified part of neck of left femur, initial encounter for closed fracture: Secondary | ICD-10-CM | POA: Diagnosis not present

## 2020-10-12 DIAGNOSIS — Z720 Tobacco use: Secondary | ICD-10-CM | POA: Diagnosis not present

## 2020-10-12 LAB — GLUCOSE, CAPILLARY
Glucose-Capillary: 104 mg/dL — ABNORMAL HIGH (ref 70–99)
Glucose-Capillary: 116 mg/dL — ABNORMAL HIGH (ref 70–99)
Glucose-Capillary: 103 mg/dL — ABNORMAL HIGH (ref 70–99)

## 2020-10-12 LAB — BASIC METABOLIC PANEL
Anion gap: 5 (ref 5–15)
BUN: 8 mg/dL (ref 8–23)
CO2: 27 mmol/L (ref 22–32)
Calcium: 8.3 mg/dL — ABNORMAL LOW (ref 8.9–10.3)
Chloride: 104 mmol/L (ref 98–111)
Creatinine, Ser: 0.8 mg/dL (ref 0.44–1.00)
GFR, Estimated: >60 mL/min (ref >60)
Glucose, Bld: 170 mg/dL — ABNORMAL HIGH (ref 70–99)
Potassium: 5 mmol/L (ref 3.5–5.1)
Sodium: 136 mmol/L (ref 135–145)

## 2020-10-12 LAB — CBC
HCT: 36 % (ref 36.0–46.0)
Hemoglobin: 11.9 g/dL — ABNORMAL LOW (ref 12.0–15.0)
MCH: 33 pg (ref 26.0–34.0)
MCHC: 33.1 g/dL (ref 30.0–36.0)
MCV: 99.7 fL (ref 80.0–100.0)
Platelets: 126 10*3/uL — ABNORMAL LOW (ref 150–400)
RBC: 3.61 MIL/uL — ABNORMAL LOW (ref 3.87–5.11)
RDW: 12.5 % (ref 11.5–15.5)
WBC: 9.8 10*3/uL (ref 4.0–10.5)
nRBC: 0 % (ref 0.0–0.2)

## 2020-10-12 LAB — T3, FREE: T3, Free: 2.1 pg/mL (ref 2.0–4.4)

## 2020-10-12 LAB — VITAMIN D 25 HYDROXY (VIT D DEFICIENCY, FRACTURES): Vit D, 25-Hydroxy: 77.19 ng/mL (ref 30–100)

## 2020-10-12 MED ORDER — INSULIN ASPART 100 UNIT/ML IJ SOLN: 0.0000 [IU] | Freq: Three times a day (TID) | INTRAMUSCULAR | Status: DC

## 2020-10-12 NOTE — Progress Notes (Signed)
Patient oxygen saturation dropped to 79% on room air after walking 10 ft. Recovered quickly on 2 liters

## 2020-10-12 NOTE — TOC Initial Note (Signed)
Transition of Care [facility]) - Initial/Assessment Note    Patient Details  Name: Melody White MRN: [id number] Date of Birth: 03-22-1951  Transition of Care [facility]) CM/SW Contact:    Ralene Bathe, LCSWA Phone Number: 10/12/2020, 1:56 PM  Clinical Narrative:                 CSW received consult for home health services for patient at discharge.  The patient was in agreement with PT/OT recommendations.  CSW offered choice of agency and explained the insurance authorization process.  The patient did not have an agency preference.    CSW verified the patient's phone number, address, and PCP.  The patient reported living with spouse and having a nephew that lives "2 doors down".  CSW inquired about needed medical equipment.  Patient reported needing the 3n1 and rolling walker, and declined the wheel chair.    CSW will continue following for any discharge needs.    Expected Discharge Plan: Home w Home Health Services Barriers to Discharge: Continued Medical Work up   Patient Goals and CMS Choice   CMS Medicare.gov Compare Post Acute Care list provided to:: Patient Choice offered to / list presented to : Patient  Expected Discharge Plan and Services Expected Discharge Plan: Home w Home Health Services       Living arrangements for the past 2 months: Single Family Home                                      Prior Living Arrangements/Services Living arrangements for the past 2 months: Single Family Home Lives with:: Spouse Patient language and need for interpreter reviewed:: Yes Do you feel safe going back to the place where you live?: Yes        Care giver support system in place?: Yes (comment)   Criminal Activity/Legal Involvement Pertinent to Current Situation/Hospitalization: No - Comment as needed  Activities of Daily Living Home Assistive Devices/Equipment: None ADL Screening (condition at time of admission) Is the patient deaf or have difficulty hearing?: No Does  the patient have difficulty seeing, even when wearing glasses/contacts?: No Does the patient have difficulty concentrating, remembering, or making decisions?: No Does the patient have difficulty dressing or bathing?: No Does the patient have difficulty walking or climbing stairs?: Yes  Permission Sought/Granted   Permission granted to share information with : Yes, Verbal Permission Granted     Permission granted to share info w AGENCY: Home health agencies        Emotional Assessment   Attitude/Demeanor/Rapport: Engaged Affect (typically observed): Accepting Orientation: : Oriented to Self,Oriented to Place,Oriented to  Time,Oriented to Situation Alcohol / Substance Use: Not Applicable Psych Involvement: No (comment)  Admission diagnosis:  Hip fracture (HCC) [S72.009A] Closed fracture of left hip, initial encounter (HCC) [S72.002A] Fall, initial encounter [W19.XXXA] Patient Active Problem List   Diagnosis Date Noted  . COPD (chronic obstructive pulmonary disease) with chronic bronchitis (HCC) 10/11/2020  . Hip fracture (HCC) 10/10/2020  . Malnutrition of moderate degree 09/23/2020  . COPD exacerbation (HCC) 09/20/2020  . Acute respiratory failure with hypoxia (HCC) 09/20/2020  . Tobacco use 09/20/2020  . History of CVA (cerebrovascular accident) 09/20/2020  . HTN (hypertension) 09/20/2020  . Hypothyroidism 09/20/2020  . HLD (hyperlipidemia) 09/20/2020   PCP:  Richmond Campbell., PA-C Pharmacy:   [facility] [phone] - [place], [place] - [address] [place] [address] Phone: [phone] Fax: [phone]     Social Determinants of Health (SDOH) Interventions    Readmission Risk Interventions No flowsheet data found.

## 2020-10-12 NOTE — Progress Notes (Signed)
Physical Therapy Treatment Patient Details Name: Melody White MRN: [id number] DOB: [birth year] Today's Date: 10/12/2020    History of Present Illness Pt is a 70 y.o. F who presents with left intertrochanteric femur fracture s/p cephalomedullary nailing 10/11/20. Significant PMH: HTN, stroke.    PT Comments    Pt continues to make progress.  Will plan for progression of HEP next session.     Follow Up Recommendations  Home health PT;Supervision for mobility/OOB     Equipment Recommendations  Rolling walker with 5" wheels;3in1 (PT);Wheelchair (measurements PT);Wheelchair cushion (measurements PT) (elevating leg rests.)    Recommendations for Other Services       Precautions / Restrictions Precautions Precautions: Fall;Other (comment) Precaution Comments: watch O2 sats Restrictions Weight Bearing Restrictions: Yes LLE Weight Bearing: Weight bearing as tolerated    Mobility  Bed Mobility Overal bed mobility: Needs Assistance Bed Mobility: Sit to Supine       Sit to supine: Min assist   General bed mobility comments: min assistance to manage LLE to back to bed.    Transfers Overall transfer level: Needs assistance Equipment used: Rolling walker (2 wheeled) Transfers: Sit to/from Stand Sit to Stand: Min assist         General transfer comment: Min assistance to rise into standing.  Cues for hand placement to and from seated surface.  Ambulation/Gait Ambulation/Gait assistance: Min guard Gait Distance (Feet): 80 Feet Assistive device: Rolling walker (2 wheeled) Gait Pattern/deviations: Step-to pattern;Decreased stance time - left;Decreased weight shift to left;Antalgic;Narrow base of support Gait velocity: decreased   General Gait Details: Cues for sequencing and pushing down through RW to offset weight.  Adjusted RW height for improved fit.   Stairs             Wheelchair Mobility    Modified Rankin (Stroke Patients Only)       Balance Overall  balance assessment: Needs assistance Sitting-balance support: Feet supported Sitting balance-Leahy Scale: Good       Standing balance-Leahy Scale: Fair                              Cognition Arousal/Alertness: Awake/alert Behavior During Therapy: WFL for tasks assessed/performed Overall Cognitive Status: Within Functional Limits for tasks assessed                                        Exercises General Exercises - Lower Extremity Long Arc Quad: AROM;Left;10 reps;Seated    General Comments        Pertinent Vitals/Pain Pain Assessment: Faces Faces Pain Scale: Hurts a little bit Pain Location: L hip Pain Descriptors / Indicators: Discomfort;Grimacing Pain Intervention(s): Ice applied    Home Living                      Prior Function            PT Goals (current goals can now be found in the care plan section) Acute Rehab PT Goals Patient Stated Goal: to get up and walk Potential to Achieve Goals: Good Progress towards PT goals: Progressing toward goals    Frequency    Min 5X/week      PT Plan Current plan remains appropriate    Co-evaluation              AM-PAC PT "6 Clicks" Mobility  Outcome Measure  Help needed turning from your back to your side while in a flat bed without using bedrails?: A Little Help needed moving from lying on your back to sitting on the side of a flat bed without using bedrails?: A Lot Help needed moving to and from a bed to a chair (including a wheelchair)?: A Little Help needed standing up from a chair using your arms (e.g., wheelchair or bedside chair)?: A Little Help needed to walk in hospital room?: A Little Help needed climbing 3-5 steps with a railing? : A Little 6 Click Score: 17    End of Session Equipment Utilized During Treatment: Gait belt;Oxygen Activity Tolerance: Patient tolerated treatment well Patient left: in chair;with call bell/phone within reach Nurse  Communication: Mobility status PT Visit Diagnosis: Difficulty in walking, not elsewhere classified (R26.2);Pain Pain - Right/Left: Left Pain - part of body: Hip     Time: 4163-8453 PT Time Calculation (min) (ACUTE ONLY): 27 min  Charges:  $Gait Training: 8-22 mins $Therapeutic Activity: 8-22 mins                     Bonney Leitz , PTA Acute Rehabilitation Services Pager [phone] Office [phone]     Daire Okimoto Artis Delay 10/12/2020, 5:09 PM

## 2020-10-12 NOTE — Progress Notes (Addendum)
Family Medicine Teaching Service Daily Progress Note Intern Pager: [phone]  Patient name: Melody White Medical record number: [id number] Date of birth: 26-Mar-1951 Age: 70 y.o. Gender: female  Primary Care Provider: Richmond Campbell., PA-C Consultants: Ortho Code Status: Full  Pt Overview and Major Events to Date:  10/11/2020: Admitted to FPTS 10/12/2020: Intramedullary nail left intertrochanteric femur fracture  Assessment and Plan: Melody White is a 70 year old female presented after fall s/p L intramedullary nail.  PMH is significant for COPD, CVA, HTN, HLD, hypothyroidism and depression.  Left intertrochanteric hip fracture s/p intramedullary nail Postop day 2.  Patient complains of pain this morning and was waiting for her pain medications.  Able to move toes on left leg, have normal sensation but unable to flex or extend the leg around thigh or knee.  No redness, erythema, deformity, pressure felt (3 bandages in place).  Received 2 tabs Norco, 2 tabs Vicodin yesterday.  Confirmed with Ortho yesterday that patient will be weightbearing as tolerated to the left lower extremity.  Worked with PT yesterday and they suggested home health PT and supervision for mobility.  Ortho is following. -- Ortho following, appreciate their assistance and recs -- Healthy heart diet --Tylenol 325-650 mg q6h PRN -- s/p Cefazolin IV 2g (3 doses) -- Benadryl 12.5 mg-25 mg q4h PRN -- d/c Norco 7.5-325 mg q4h PRN -- Vicodin 5-325 mg q4h PRN -- Reglan 5-10 mg q8h PRN if Zofran ineffective  -- d/c Morphine 2mg /ml IV q2 PRN -- TOC consulted as recommended by PT, appreciate PT help  Hyperglycemia CBG 104 this morning and blood glucose 170 -- mSSI scale --CBG monitoring  Hypothyroidism Home meds: Synthroid 50 MCG -- c/w Synthroid 50 MCG  Hx of COPD Denies any shortness of breath.  On physical exam chest is clear to auscultation. --Albuterol 2.5 mg neb --Dulera inhaler 2 puffs --Montelukast 10 mg  PO QHS --INcruse Ellipta  Depression Home meds: Amitriptyline 25 mg, Lexapro 20 mg -- Amitriptyline 25 mg p.o.QHS -- Lexapro 20 mg  Hx of CVA Home meds: ASA 81 mg  -- c/w Aspirin 81 mg daily  Tobacco use 3-4 cigarettes/day -- Nicotine patch 7 mg  HLD Home meds: Crestor 10 mg -- c/w Crestor 10 mg  FEN/GI: Heart healthy diet PPx: Lovenox   Status is: Inpatient  Remains inpatient appropriate because:Ongoing active pain requiring inpatient pain management   Dispo:  Patient From: Home  Planned Disposition: Home with Health Care Svc  Medically stable for discharge: No          Subjective:  No acute overnight events. Patient evaluated at bedside this morning.  Complains of pain and was waiting for her pain medications.  Complain of unable to urinate as well, RN notified immediately.   Objective: Temp:  [97.8 F (36.6 C)-98.9 F (37.2 C)] 98.4 F (36.9 C) (05/31 1235) Pulse Rate:  [69-86] 86 (05/31 1235) Resp:  [14-17] 16 (05/31 1235) BP: (99-109)/(53-67) 107/67 (05/31 1235) SpO2:  [95 %-100 %] 99 % (05/31 1235) Physical Exam: General: Pleasant elderly lady lying comfortably in bed, NAD Cardiovascular: RRR Respiratory: Clear to auscultation bilaterally Abdomen: Soft, nondistended, nontender, bowel sounds present Extremities: Left leg has 3 bandages in place, no redness no erythema no swelling noted Psych: Good mood and affect Neuro: Alert, awake, oriented to time place and person  Laboratory: Recent Labs  Lab 10/10/20 1744 10/11/20 0052 10/12/20 0132  WBC 6.9 8.8 9.8  HGB 14.4 13.6 11.9*  HCT 42.6 40.9 36.0  PLT  163 149* 126*   Recent Labs  Lab 10/10/20 1744 10/11/20 0052 10/12/20 0132  NA 137 136 136  K 3.8 3.8 5.0  CL 103 104 104  CO2 24 24 27   BUN 16 14 8   CREATININE 0.94 0.87 0.80  CALCIUM 8.5* 8.5* 8.3*  PROT  --  5.5*  --   BILITOT  --  0.6  --   ALKPHOS  --  53  --   ALT  --  28  --   AST  --  29  --   GLUCOSE 85 107* 170*     Imaging/Diagnostic Tests: No results found.  , MD 10/12/2020, 1:28 PM PGY-1, [facility] Family Medicine FPTS Intern pager: [phone], text pages welcome

## 2020-10-12 NOTE — TOC CAGE-AID Note (Signed)
Transition of Care [facility]) - CAGE-AID Screening   Patient Details  Name: CHIRSTY ARMISTEAD MRN: [id number] Date of Birth: [birth year]  Clinical Narrative:  Patient is a current everyday cigarette smoker, states about 4 cigarettes a day. Denies any current alcohol or drug use. No need for substance abuse education at this time.  CAGE-AID Screening:    Have You Ever Felt You Ought to Cut Down on Your Drinking or Drug Use?: No Have People Annoyed You By Critizing Your Drinking Or Drug Use?: No Have You Felt Bad Or Guilty About Your Drinking Or Drug Use?: No Have You Ever Had a Drink or Used Drugs First Thing In The Morning to Steady Your Nerves or to Get Rid of a Hangover?: No CAGE-AID Score: 0  Substance Abuse Education Offered: No

## 2020-10-12 NOTE — Progress Notes (Signed)
Occupational Therapy Evaluation Patient Details Name: Melody White MRN: [id number] DOB: 11/04/1950 Today's Date: 10/12/2020    History of Present Illness Pt is a 70 y.o. F who presents with left intertrochanteric femur fracture s/p cephalomedullary nailing 10/11/20. Significant PMH: HTN, stroke.   Clinical Impression   Melody White was evaluated s/p the above fall and L hip IM nail placement. PTA pt reports being indep in all ADL/IADLs including driving and tending to her 74 y.o. granddaughter; she does report being fairly sedentary at home, and her husband has to help her get up from their low couch. Upon this eval, pt reports mild pain and is very motivated to participate with therapy. For bed mobility pt was min A for LLE management with reports of som dizziness once sitting EOB, resolved quickly. VSS on 2 L nasal canula O2. Pt was min A to boost and steady in standing and transitioned to close min guard with rw for stand pivot x2. Pt used BSC and was min guard for hygiene. She requested to stand for an extended period of time, no LOB while standing with 1 UE supported on rw. Pt benefits from continued OT acutely to progress lower body ADLs and safety. Recommend d/c to home with HHOT to allow for safe transition and continued rehab within the home.     Follow Up Recommendations  Home health OT;Supervision - Intermittent    Equipment Recommendations  3 in 1 bedside commode;Other (comment) (rw)       Precautions / Restrictions Precautions Precautions: Fall;Other (comment) Precaution Comments: watch O2 sats Restrictions Weight Bearing Restrictions: Yes LLE Weight Bearing: Weight bearing as tolerated      Mobility Bed Mobility Overal bed mobility: Needs Assistance Bed Mobility: Supine to Sit     Supine to sit: Min assist     General bed mobility comments: management of LLE    Transfers Overall transfer level: Needs assistance Equipment used: Rolling walker (2 wheeled) Transfers: Sit  to/from [facility] Sit to Stand: Min assist Stand pivot transfers: Min guard       General transfer comment: Min A to rise and steady in standing, close min guard during stand pivot transfers for safety; vc throughout for safey adn sequencing    Balance Overall balance assessment: Needs assistance Sitting-balance support: Feet supported Sitting balance-Leahy Scale: Good     Standing balance support: Single extremity supported;During functional activity Standing balance-Leahy Scale: Fair Standing balance comment: Pt able to complete functional task in standing with only 1 UE supported on RW                           ADL either performed or assessed with clinical judgement   ADL Overall ADL's : Needs assistance/impaired Eating/Feeding: Independent;Sitting   Grooming: Wash/dry hands;Wash/dry face;Oral care;Applying deodorant;Brushing hair;Set up;Sitting   Upper Body Bathing: Set up;Sitting   Lower Body Bathing: Minimal assistance;Sit to/from stand   Upper Body Dressing : Set up;Sitting   Lower Body Dressing: Moderate assistance;Sit to/from stand   Toilet Transfer: Minimal assistance;BSC;RW;Stand-pivot;Cueing for safety   Toileting- Clothing Manipulation and Hygiene: Min guard;Sitting/lateral lean       Functional mobility during ADLs: Minimal assistance;Rolling walker;Cueing for safety General ADL Comments: assist levels for lower body management due to pain of L hip     Vision Patient Visual Report: No change from baseline Vision Assessment?: No apparent visual deficits            Pertinent Vitals/Pain Pain  Assessment: Faces Faces Pain Scale: Hurts a little bit Pain Location: L hip Pain Descriptors / Indicators: Discomfort;Grimacing Pain Intervention(s): Limited activity within patient's tolerance;Monitored during session;Premedicated before session     Hand Dominance Right   Extremity/Trunk Assessment Upper Extremity  Assessment Upper Extremity Assessment: Overall WFL for tasks assessed (Pt has hx of L shoulder sx; recovered well)   Lower Extremity Assessment Lower Extremity Assessment: Defer to PT evaluation   Cervical / Trunk Assessment Cervical / Trunk Assessment: Normal   Communication Communication Communication: No difficulties   Cognition Arousal/Alertness: Awake/alert Behavior During Therapy: WFL for tasks assessed/performed Overall Cognitive Status: Within Functional Limits for tasks assessed        General Comments  noted some blood on purwick, therapist removed device and RN notified; sx bandaged clean and intact, no other issues noted      Home Living Family/patient expects to be discharged to:: Private residence Living Arrangements: Spouse/significant other Available Help at Discharge: Family Type of Home: House Home Access: Stairs to enter Technical brewer of Steps: 2 Entrance Stairs-Rails: None Home Layout: One level     Bathroom Shower/Tub: Walk-in shower;Door;Other (comment) (built in seat)   Bathroom Toilet: Handicapped height Bathroom Accessibility: Yes How Accessible: Accessible via walker Home Equipment: Shower seat - built in;Grab bars - tub/shower   Additional Comments: Pt has a lot of family close by who can provide asssitance as needed; her husband has mac degen, however she reports is still able to physically assist the pt      Prior Functioning/Environment Level of Independence: Independent        Comments: Reports being fairly sedentary, does not work, watches after her 45 yo granddaughter some, drives, does her own grocery shopping and med management        OT Problem List: Decreased strength;Decreased activity tolerance;Decreased range of motion;Impaired balance (sitting and/or standing);Decreased safety awareness;Decreased knowledge of use of DME or AE;Pain      OT Treatment/Interventions: Self-care/ADL training;Therapeutic exercise;DME  and/or AE instruction;Therapeutic activities;Patient/family education;Balance training    OT Goals(Current goals can be found in the care plan section) Acute Rehab OT Goals Patient Stated Goal: to get up and walk OT Goal Formulation: With patient Time For Goal Achievement: 10/26/20 Potential to Achieve Goals: Fair ADL Goals Pt Will Perform Grooming: with supervision;standing Pt Will Perform Lower Body Bathing: with supervision;sit to/from stand Pt Will Perform Lower Body Dressing: with modified independence;sit to/from stand Pt Will Transfer to Toilet: with modified independence;ambulating Pt Will Perform Toileting - Clothing Manipulation and hygiene: Independently;sit to/from stand Additional ADL Goal #1: Pt will recall at least 3 fall prevention strategies to ensure safe transition into the home environment.  OT Frequency: Min 2X/week    AM-PAC OT "6 Clicks" Daily Activity     Outcome Measure Help from another person eating meals?: None Help from another person taking care of personal grooming?: A Little Help from another person toileting, which includes using toliet, bedpan, or urinal?: A Little Help from another person bathing (including washing, rinsing, drying)?: A Little Help from another person to put on and taking off regular upper body clothing?: A Little Help from another person to put on and taking off regular lower body clothing?: A Lot 6 Click Score: 18   End of Session Equipment Utilized During Treatment: Gait belt;Rolling walker;Other (comment) (BSC) Nurse Communication: Mobility status;Weight bearing status (Pt urine output in BSC bucket, purwick removed)  Activity Tolerance: Patient tolerated treatment well Patient left: in chair;with call bell/phone within reach;with chair  alarm set  OT Visit Diagnosis: Other abnormalities of gait and mobility (R26.89);History of falling (Z91.81);Muscle weakness (generalized) (M62.81);Pain Pain - Right/Left: Left Pain - part of  body: Hip                Time: 1062-6948 OT Time Calculation (min): 31 min Charges:  OT General Charges $OT Visit: 1 Visit OT Evaluation $OT Eval Moderate Complexity: 1 Mod OT Treatments $Self Care/Home Management : 8-22 mins   Jaselle Pryer A Chyla Schlender 10/12/2020, 9:32 AM

## 2020-10-12 NOTE — Progress Notes (Signed)
Orthopaedic Trauma Progress Note  SUBJECTIVE: Reports mild pain about operative site, well controlled. No chest pain. No SOB. No nausea/vomiting. No other complaints. Was able to get out of bed with PT yesterday after surgery  OBJECTIVE:  Vitals:   10/12/20 0340 10/12/20 0729  BP: (!) 108/53 105/67  Pulse: 69 69  Resp: 14 17  Temp: 98.5 F (36.9 C) 98.5 F (36.9 C)  SpO2: 99% 100%    General: Sitting up in bed, no acute distress Respiratory: No increased work of breathing.  Left lower extremity: Dressing clean, dry, intact.  No significant tenderness with palpation throughout extremity. Compartments are soft and compressible.  Ankle dorsiflexion/plantarflexion is intact.  Foot warm and well-perfused.  Neurovascularly intact   IMAGING: Stable post op imaging.   LABS:  Results for orders placed or performed during the hospital encounter of 10/10/20 (from the past 24 hour(s))  Basic metabolic panel     Status: Abnormal   Collection Time: 10/12/20  1:32 AM  Result Value Ref Range   Sodium 136 135 - 145 mmol/L   Potassium 5.0 3.5 - 5.1 mmol/L   Chloride 104 98 - 111 mmol/L   CO2 27 22 - 32 mmol/L   Glucose, Bld 170 (H) 70 - 99 mg/dL   BUN 8 8 - 23 mg/dL   Creatinine, Ser 9.56 0.44 - 1.00 mg/dL   Calcium 8.3 (L) 8.9 - 10.3 mg/dL   GFR, Estimated >21 >30 mL/min   Anion gap 5 5 - 15  CBC     Status: Abnormal   Collection Time: 10/12/20  1:32 AM  Result Value Ref Range   WBC 9.8 4.0 - 10.5 K/uL   RBC 3.61 (L) 3.87 - 5.11 MIL/uL   Hemoglobin 11.9 (L) 12.0 - 15.0 g/dL   HCT 86.5 78.4 - 69.6 %   MCV 99.7 80.0 - 100.0 fL   MCH 33.0 26.0 - 34.0 pg   MCHC 33.1 30.0 - 36.0 g/dL   RDW 29.5 28.4 - 13.2 %   Platelets 126 (L) 150 - 400 K/uL   nRBC 0.0 0.0 - 0.2 %    ASSESSMENT: Melody White is a 70 y.o. female, 1 Day Post-Op s/p INTRAMEDULLARY NAIL LEFT INTERTROCHANTERIC  FEMUR FRACTURE  CV/Blood loss: Acute blood loss anemia, Hgb 11.9 this morning. Hemodynamically  stable  PLAN: Weightbearing: WBAT LLE Incisional and dressing care: Reinforce dressings as needed  Showering: Patient okay to begin showering 10/14/2020 if no drainage from incisions Orthopedic device(s): None  Pain management:  1. Tylenol 325-650 mg q 6 hours PRN 2. Robaxin 500 mg q 6 hours PRN 3. Norco 5-325 mg q 4 hours PRN moderate pain 4. Norco 7.5-325 mg q 4 hours PRN severe pain 5. Morphine 0.5-1 mg q 2 hours PRN VTE prophylaxis: Lovenox, SCDs ID:  Ancef 2gm post op Foley/Lines:  No foley, KVO IVFs Impediments to Fracture Healing: Vitamin D level pending, start supplementation as indicated Dispo: PT/OT evaluation today, dispo pending.  Plan to remove dressings tomorrow to evaluate incisions  Follow - up plan: 2 weeks after discharge for repeat x-rays and wound check  Contact information:  Truitt Merle MD, Ulyses Southward PA-C. After hours and holidays please check [email or web address] for group call information for [facility]   Melody Distler A. Michaelyn Barter, PA-C [phone] (office) [email or web address]

## 2020-10-13 ENCOUNTER — Other Ambulatory Visit (HOSPITAL_COMMUNITY): Payer: Self-pay

## 2020-10-13 DIAGNOSIS — S72002A Fracture of unspecified part of neck of left femur, initial encounter for closed fracture: Secondary | ICD-10-CM | POA: Diagnosis not present

## 2020-10-13 DIAGNOSIS — Z9981 Dependence on supplemental oxygen: Secondary | ICD-10-CM

## 2020-10-13 DIAGNOSIS — Z8673 Personal history of transient ischemic attack (TIA), and cerebral infarction without residual deficits: Secondary | ICD-10-CM | POA: Diagnosis not present

## 2020-10-13 DIAGNOSIS — Z720 Tobacco use: Secondary | ICD-10-CM | POA: Diagnosis not present

## 2020-10-13 DIAGNOSIS — J449 Chronic obstructive pulmonary disease, unspecified: Secondary | ICD-10-CM | POA: Diagnosis not present

## 2020-10-13 LAB — BASIC METABOLIC PANEL
Anion gap: 6 (ref 5–15)
BUN: 11 mg/dL (ref 8–23)
CO2: 31 mmol/L (ref 22–32)
Calcium: 8.4 mg/dL — ABNORMAL LOW (ref 8.9–10.3)
Chloride: 99 mmol/L (ref 98–111)
Creatinine, Ser: 0.89 mg/dL (ref 0.44–1.00)
GFR, Estimated: >60 mL/min (ref >60)
Glucose, Bld: 104 mg/dL — ABNORMAL HIGH (ref 70–99)
Potassium: 4.1 mmol/L (ref 3.5–5.1)
Sodium: 136 mmol/L (ref 135–145)

## 2020-10-13 LAB — CBC
HCT: 31.9 % — ABNORMAL LOW (ref 36.0–46.0)
Hemoglobin: 10.5 g/dL — ABNORMAL LOW (ref 12.0–15.0)
MCH: 32.6 pg (ref 26.0–34.0)
MCHC: 32.9 g/dL (ref 30.0–36.0)
MCV: 99.1 fL (ref 80.0–100.0)
Platelets: DECREASED 10*3/uL (ref 150–400)
RBC: 3.22 MIL/uL — ABNORMAL LOW (ref 3.87–5.11)
RDW: 12.6 % (ref 11.5–15.5)
WBC: 6.3 10*3/uL (ref 4.0–10.5)
nRBC: 0 % (ref 0.0–0.2)

## 2020-10-13 LAB — GLUCOSE, CAPILLARY
Glucose-Capillary: 96 mg/dL (ref 70–99)
Glucose-Capillary: 111 mg/dL — ABNORMAL HIGH (ref 70–99)
Glucose-Capillary: 116 mg/dL — ABNORMAL HIGH (ref 70–99)

## 2020-10-13 MED ORDER — ENOXAPARIN (LOVENOX) PATIENT EDUCATION KIT
PACK | Freq: Once | Status: AC
  Filled 2020-10-13: qty 1

## 2020-10-13 MED ORDER — APIXABAN 2.5 MG PO TABS
2.5000 mg | ORAL_TABLET | Freq: Two times a day (BID) | ORAL | 0 refills | Status: DC
  Filled 2020-10-13: qty 60, 30d supply, fill #0

## 2020-10-13 MED ORDER — ACETAMINOPHEN 325 MG PO TABS
325.0000 mg | ORAL_TABLET | Freq: Four times a day (QID) | ORAL | 0 refills | Status: DC | PRN
  Filled 2020-10-13: qty 30, 4d supply, fill #0

## 2020-10-13 MED ORDER — DIPHENHYDRAMINE HCL 12.5 MG/5ML PO ELIX
12.5000 mg | ORAL_SOLUTION | ORAL | 0 refills | Status: DC | PRN
  Filled 2020-10-13: qty 120, 2d supply, fill #0

## 2020-10-13 MED ORDER — ENOXAPARIN SODIUM 40 MG/0.4ML IJ SOSY: 40.0000 mg | PREFILLED_SYRINGE | INTRAMUSCULAR | 0 refills | Status: DC

## 2020-10-13 MED ORDER — CALCIUM CARBONATE 1500 (600 CA) MG PO TABS
1.0000 | ORAL_TABLET | Freq: Two times a day (BID) | ORAL | 0 refills | Status: AC
  Filled 2020-10-13: qty 60, 30d supply, fill #0

## 2020-10-13 MED ORDER — HYDROCODONE-ACETAMINOPHEN 5-325 MG PO TABS: 1.0000 | ORAL_TABLET | Freq: Four times a day (QID) | ORAL | 0 refills | Status: DC | PRN

## 2020-10-13 MED ORDER — CHOLECALCIFEROL 25 MCG (1000 UT) PO TABS
1000.0000 [IU] | ORAL_TABLET | Freq: Every day | ORAL | 0 refills | Status: AC
  Filled 2020-10-13: qty 30, 30d supply, fill #0

## 2020-10-13 MED ORDER — NICOTINE 7 MG/24HR TD PT24
7.0000 mg | MEDICATED_PATCH | Freq: Every day | TRANSDERMAL | 0 refills | Status: DC | PRN
  Filled 2020-10-13: qty 28, 28d supply, fill #0

## 2020-10-13 NOTE — TOC Transition Note (Signed)
Transition of Care [facility]) - CM/SW Discharge Note   Patient Details  Name: Melody White MRN: [id number] Date of Birth: 19-Jul-1950  Transition of Care [facility]) CM/SW Contact:  Ralene Bathe, LCSWA Phone Number: 10/13/2020, 4:52 PM   Clinical Narrative:    Patient will DC to: Home with [facility] Anticipated DC date: 10/13/2020 Transport by: Family (sister)   Per MD patient ready for DC home with home health.  [facility] will be providing home health PT/OT and [facility] will be providing the DME.  The patient has been informed of this information and is ready to get home.  The patient did not have any concerns for affording medications and the patient's sister will transport the patient home when she is medically cleared.    CSW will sign off for now as social work intervention is no longer needed. Please consult [place] again if new needs arise.     Final next level of care: Home w Home Health Services Barriers to Discharge: No Barriers Identified   Patient Goals and CMS Choice   CMS Medicare.gov Compare Post Acute Care list provided to:: Patient Choice offered to / list presented to : Patient  Discharge Placement                  Name of family member notified: patient alert and oriented    Discharge Plan and Services                DME Arranged: 3-N-1,Oxygen,Walker rolling DME Agency: [facility] Date DME Agency Contacted: 10/13/20 Time DME Agency Contacted: 1651 Representative spoke with at DME Agency: Velna Hatchet HH Arranged: PT,OT HH Agency: [facility] Date [facility] Agency Contacted: 10/13/20 Time HH Agency Contacted: 1652 Representative spoke with at [facility] Agency: Stacie  Social Determinants of Health (SDOH) Interventions     Readmission Risk Interventions No flowsheet data found.

## 2020-10-13 NOTE — Progress Notes (Signed)
SATURATION QUALIFICATIONS: (This note is used to comply with regulatory documentation for home oxygen)  Patient Saturations on Room Air at Rest = 93%  Patient Saturations on Room Air while Ambulating = 83%  Patient Saturations on 2 Liters of oxygen while Ambulating = 92%  Please briefly explain why patient needs home oxygen: desaturation with activity.   Quentin Angst Acute Rehabilitation Services Pager [phone] Office [phone]

## 2020-10-13 NOTE — Discharge Instructions (Addendum)
Orthopaedic Trauma Service Discharge Instructions   General Discharge Instructions  WEIGHT BEARING STATUS: Weightbearing as tolerated  RANGE OF MOTION/ACTIVITY: Ok for hip and knee motion as tolerated  Wound Care: Incisions can be left open to air if there is no drainage. If incision continues to have drainage, follow wound care instructions below. Okay to shower if no drainage from incisions.  DVT/PE prophylaxis: Lovenox  Diet: as you were eating previously.  Can use over the counter stool softeners and bowel preparations, such as Miralax, to help with bowel movements.  Narcotics can be constipating.  Be sure to drink plenty of fluids  PAIN MEDICATION USE AND EXPECTATIONS  You have likely been given narcotic medications to help control your pain.  After a traumatic event that results in an fracture (broken bone) with or without surgery, it is ok to use narcotic pain medications to help control one's pain.  We understand that everyone responds to pain differently and each individual patient will be evaluated on a regular basis for the continued need for narcotic medications. Ideally, narcotic medication use should last no more than 6-8 weeks (coinciding with fracture healing).   As a patient it is your responsibility as well to monitor narcotic medication use and report the amount and frequency you use these medications when you come to your office visit.   We would also advise that if you are using narcotic medications, you should take a dose prior to therapy to maximize you participation.  IF YOU ARE ON NARCOTIC MEDICATIONS IT IS NOT PERMISSIBLE TO OPERATE A MOTOR VEHICLE (MOTORCYCLE/CAR/TRUCK/MOPED) OR HEAVY MACHINERY DO NOT MIX NARCOTICS WITH OTHER CNS (CENTRAL NERVOUS SYSTEM) DEPRESSANTS SUCH AS ALCOHOL   STOP SMOKING OR USING NICOTINE PRODUCTS!!!!  As discussed nicotine severely impairs your body's ability to heal surgical and traumatic wounds but also impairs bone healing.  Wounds  and bone heal by forming microscopic blood vessels (angiogenesis) and nicotine is a vasoconstrictor (essentially, shrinks blood vessels).  Therefore, if vasoconstriction occurs to these microscopic blood vessels they essentially disappear and are unable to deliver necessary nutrients to the healing tissue.  This is one modifiable factor that you can do to dramatically increase your chances of healing your injury.    (This means no smoking, no nicotine gum, patches, etc)  DO NOT USE NONSTEROIDAL ANTI-INFLAMMATORY DRUGS (NSAID'S)  Using products such as Advil (ibuprofen), Aleve (naproxen), Motrin (ibuprofen) for additional pain control during fracture healing can delay and/or prevent the healing response.  If you would like to take over the counter (OTC) medication, Tylenol (acetaminophen) is ok.  However, some narcotic medications that are given for pain control contain acetaminophen as well. Therefore, you should not exceed more than 4000 mg of tylenol in a day if you do not have liver disease.  Also note that there are may OTC medicines, such as cold medicines and allergy medicines that my contain tylenol as well.  If you have any questions about medications and/or interactions please ask your doctor/PA or your pharmacist.      ICE AND ELEVATE INJURED/OPERATIVE EXTREMITY  Using ice and elevating the injured extremity above your heart can help with swelling and pain control.  Icing in a pulsatile fashion, such as 20 minutes on and 20 minutes off, can be followed.    Do not place ice directly on skin. Make sure there is a barrier between to skin and the ice pack.    Using frozen items such as frozen peas works well as the  conform nicely to the are that needs to be iced.  USE AN ACE WRAP OR TED HOSE FOR SWELLING CONTROL  In addition to icing and elevation, Ace wraps or TED hose are used to help limit and resolve swelling.  It is recommended to use Ace wraps or TED hose until you are informed to stop.     When using Ace Wraps start the wrapping distally (farthest away from the body) and wrap proximally (closer to the body)   Example: If you had surgery on your leg or thing and you do not have a splint on, start the ace wrap at the toes and work your way up to the thigh        If you had surgery on your upper extremity and do not have a splint on, start the ace wrap at your fingers and work your way up to the upper arm   CALL THE OFFICE WITH ANY QUESTIONS OR CONCERNS: [phone]   VISIT OUR WEBSITE FOR ADDITIONAL INFORMATION: [email or web address]    Discharge Wound Care Instructions  Do NOT apply any ointments, solutions or lotions to pin sites or surgical wounds.  These prevent needed drainage and even though solutions like hydrogen peroxide kill bacteria, they also damage cells lining the pin sites that help fight infection.  Applying lotions or ointments can keep the wounds moist and can cause them to breakdown and open up as well. This can increase the risk for infection. When in doubt call the office.  If any drainage is noted, use one layer of adaptic, then gauze, Kerlix, and an ace wrap.  Once the incision is completely dry and without drainage, it may be left open to air out.  Showering may begin 36-48 hours later.  Cleaning gently with soap and water.  ===============================  Information on my medicine - ELIQUIS (apixaban)  This medication education was reviewed with me or my healthcare representative as part of my discharge preparation.   Why was Eliquis prescribed for you? Eliquis was prescribed for you to reduce the risk of blood clots forming after orthopedic surgery.    What do You need to know about Eliquis? Take your Eliquis TWICE DAILY - one tablet in the morning and one tablet in the evening with or without food.  It would be best to take the dose about the same time each day.  If you have difficulty swallowing the tablet whole please discuss with your  pharmacist how to take the medication safely.  Take Eliquis exactly as prescribed by your doctor and DO NOT stop taking Eliquis without talking to the doctor who prescribed the medication.  Stopping without other medication to take the place of Eliquis may increase your risk of developing a clot.  After discharge, you should have regular check-up appointments with your healthcare provider that is prescribing your Eliquis.  What do you do if you miss a dose? If a dose of ELIQUIS is not taken at the scheduled time, take it as soon as possible on the same day and twice-daily administration should be resumed.  The dose should not be doubled to make up for a missed dose.  Do not take more than one tablet of ELIQUIS at the same time.  Important Safety Information A possible side effect of Eliquis is bleeding. You should call your healthcare provider right away if you experience any of the following: ? Bleeding from an injury or your nose that does not stop. ? Unusual colored urine (red or dark  brown) or unusual colored stools (red or black). ? Unusual bruising for unknown reasons. ? A serious fall or if you hit your head (even if there is no bleeding).  Some medicines may interact with Eliquis and might increase your risk of bleeding or clotting while on Eliquis. To help avoid this, consult your healthcare provider or pharmacist prior to using any new prescription or non-prescription medications, including herbals, vitamins, non-steroidal anti-inflammatory drugs (NSAIDs) and supplements.  This website has more information on Eliquis (apixaban): http://www.eliquis.com/eliquis/home

## 2020-10-13 NOTE — Progress Notes (Signed)
Patient also receiving 3 in 1 potty charge, home concentrator and tank, pharmacy dropped off medicines except for some over the counter medications.

## 2020-10-13 NOTE — Progress Notes (Signed)
FPTS Interim Progress Note Visit patient to view new nasal rash Discussed with Dr. Gerarda Fraction who expressed rash not present yesterday. It is not bothersome to the patient and the patient states it has been present for years and her father had the same rash. It is macular with varying shades of brown to red over the bridge of the nose. The patient nurse expressed that it was present yesterday just not as pronounced. Will continue to monitor for changes, otherwise likely needs follow up outpatient with dermatology. Suspicion for BCC or SCC is low but could consider.    Media Information    Document Information  Photos    10/13/2020 09:35  Attached To:  Hospital Encounter on 10/10/20   Source Information  Autry-Lott, [place], DO  [facility]     Autry-Lott, [place], DO 10/13/2020, 9:44 AM PGY-2, [facility] Family Medicine Service pager [phone]

## 2020-10-13 NOTE — Discharge Summary (Addendum)
Family Medicine Teaching [facility] Discharge Summary  Patient name: Melody White Medical record number: [id number] Date of birth: 12/02/1950 Age: 70 y.o. Gender: female Date of Admission: 10/10/2020  Date of Discharge: 10/13/2020 Admitting Physician: Leeroy Bock, DO  Primary Care Provider: Richmond Campbell., PA-C Consultants: Ortho  Indication for Hospitalization: Intertrochanteric hip fracture  Discharge Diagnoses/Problem List:  1.  Intertrochanteric hip fracture s/p Cephalomedullary nail 2.  Hyperglycemia 3.  Chronic hypoxic respiratory failure, COPD  Disposition: Home  Discharge Condition: Stable  Discharge Exam:  General: Pleasant lady lying comfortably in bed, NAD  Cardiovascular: regular rate and rhythm, no murmurs, rubs, gallops,  Respiratory: clear to ascultation bilaterally  Abdomen: Soft, non-distended,  Extermites: 3 bandages in places, no warmth, no edema Neurological: A&Ox3 Psych: pleasant mood and affect  Skin: rosacea on mid face  Brief Hospital Course:  Melody White is a 70 y.o. female presenting with fall. PMH is significant for COPD, CVA, HTN, HLD, Hypothyroidism and Depression.     Left Introchanteric Hip Fracture s/p Cephalomedullary nail Experienced fall on 5/29 and presented with left hip pain found to have a left intertochanteric fracture. She was hemodynamically stable and no sign of neurovascular compromise on physical exam. Orthopedics followed and [place] traction was placed.  Cephalomedullary nail on 10/11/2020 along with instruction of weightbearing as tolerated. At discharge she was started on vitamin D and calcium supplementation. Eliquis was started for VTE prophylaxis.   Hyperglycemia Placed on moderate SSI scale during admission. Glucoses were <120 in the 24 hours prior to discharge.   Chronic hypoxic respiratory failure, COPD She was recently discharged on 2 L home oxygen during previous admission. She was discharged on 2L nasal  cannula.   Hypertension  Patient remained normotensive during admission and home medications were held.    All chronic conditions stable during admission.         Issues for Follow Up:  1. TSH in 1 week 2. CBC, BMP in 1 week 3. Reassess O2 requirement on PCP appointment 4. BP check in 1 week and re-evaluation about re-starting BP medication. 5. Recommend low dose CT given smoking history  Significant Procedures: L intramedullary nail left intertrochanteric femur fracture  Significant Labs and Imaging:  Recent Labs  Lab 10/11/20 0052 10/12/20 0132 10/13/20 0249  WBC 8.8 9.8 6.3  HGB 13.6 11.9* 10.5*  HCT 40.9 36.0 31.9*  PLT 149* 126* PLATELET CLUMPS NOTED ON SMEAR, COUNT APPEARS DECREASED   Recent Labs  Lab 10/10/20 1744 10/11/20 0052 10/12/20 0132 10/13/20 0249  NA 137 136 136 136  K 3.8 3.8 5.0 4.1  CL 103 104 104 99  CO2 24 24 27 31   GLUCOSE 85 107* 170* 104*  BUN 16 14 8 11   CREATININE 0.94 0.87 0.80 0.89  CALCIUM 8.5* 8.5* 8.3* 8.4*  ALKPHOS  --  53  --   --   AST  --  29  --   --   ALT  --  28  --   --   ALBUMIN  --  3.1*  --   --    DG Chest 1 View  Result Date: 10/10/2020 CLINICAL DATA:  Recent fall with known left hip fracture, initial encounter EXAM: CHEST  1 VIEW COMPARISON:  09/20/2020 FINDINGS: Cardiac shadow is within normal limits. The lungs are hyperinflated. No focal infiltrate or sizable effusion is seen. No bony abnormality is noted. IMPRESSION: COPD without acute abnormality. Electronically Signed   By: 10/12/2020.D.  On: 10/10/2020 19:17   DG C-Arm 1-60 Min  Result Date: 10/11/2020 CLINICAL DATA:  ORIF left femur fracture. EXAM: LEFT FEMUR 2 VIEWS; DG C-ARM 1-60 MIN COMPARISON:  Left hip radiographic series-10/10/2020 FLUOROSCOPY TIME:  1 minute, 29 seconds (8.5 mGy) FINDINGS: 6 spot intraoperative fluoroscopic images of the left femur are provided for review and demonstrate the sequela of intramedullary rod fixation of previously  noted comminuted intertrochanteric femur fracture. The proximal end of the femoral rod is transfixed with two dynamic screws while the distal end is transfixed with a single cancellous screw. Alignment appears near anatomic. There is a minimal amount of expected subcutaneous emphysema about the operative site. No radiopaque foreign body. IMPRESSION: Post intramedullary rod fixation of the femur and dynamic screw fixation of the left femoral neck without evidence of complication. Electronically Signed   By: Simonne ComeJohn  Watts M.D.   On: 10/11/2020 10:01   DG Hip Unilat With Pelvis 2-3 Views Left  Result Date: 10/10/2020 CLINICAL DATA:  Recent fall with hip pain, initial encounter EXAM: DG HIP (WITH OR WITHOUT PELVIS) 2-3V LEFT COMPARISON:  None. FINDINGS: Pelvic ring is intact. Comminuted intratrochanteric fracture is noted with impaction and angulation at the fracture site. Degenerative changes of the hip joints are noted bilaterally. No soft tissue abnormality is seen. IMPRESSION: Left intratrochanteric fracture as described. Electronically Signed   By: Alcide CleverMark  Lukens M.D.   On: 10/10/2020 19:16   DG FEMUR MIN 2 VIEWS LEFT  Result Date: 10/11/2020 CLINICAL DATA:  ORIF left femur fracture. EXAM: LEFT FEMUR 2 VIEWS; DG C-ARM 1-60 MIN COMPARISON:  Left hip radiographic series-10/10/2020 FLUOROSCOPY TIME:  1 minute, 29 seconds (8.5 mGy) FINDINGS: 6 spot intraoperative fluoroscopic images of the left femur are provided for review and demonstrate the sequela of intramedullary rod fixation of previously noted comminuted intertrochanteric femur fracture. The proximal end of the femoral rod is transfixed with two dynamic screws while the distal end is transfixed with a single cancellous screw. Alignment appears near anatomic. There is a minimal amount of expected subcutaneous emphysema about the operative site. No radiopaque foreign body. IMPRESSION: Post intramedullary rod fixation of the femur and dynamic screw fixation of  the left femoral neck without evidence of complication. Electronically Signed   By: Simonne ComeJohn  Watts M.D.   On: 10/11/2020 10:01   DG FEMUR PORT MIN 2 VIEWS LEFT  Result Date: 10/11/2020 CLINICAL DATA:  Post ORIF of left femur fracture. EXAM: LEFT FEMUR PORTABLE 2 VIEWS COMPARISON:  Intraoperative radiographs of the left femur-earlier same day; left hip radiographs-10/10/2020 FINDINGS: Post intramedullary rod fixation of the left femur transfixing previously noted comminuted intertrochanteric femur fracture. Alignment appears near anatomic. The proximal end of the intramedullary rod is transfixed with 2 dynamic screws while the distal end is transfixed with a single cancellous screw. Minimal amount of subcutaneous emphysema about the operative site. No radiopaque foreign body. IMPRESSION: Post intramedullary rod fixation of the left femur and dynamic screw fixation of the femoral neck without evidence of complication. Electronically Signed   By: Simonne ComeJohn  Watts M.D.   On: 10/11/2020 10:35    Results/Tests Pending at Time of Discharge: None  Discharge Medications:  Allergies as of 10/13/2020      Reactions   Amoxicillin Other (See Comments)   other   Azithromycin Itching   Clindamycin Other (See Comments)   Funny feeling other   Codeine Itching, Other (See Comments)   other   Fexofenadine Other (See Comments)   Funny feeling other  Hydrocodone Bit-homatrop Mbr Itching   Sulfamethoxazole Nausea Only, Other (See Comments)   other      Medication List    STOP taking these medications   ibuprofen 200 MG tablet Commonly known as: ADVIL   lisinopril 10 MG tablet Commonly known as: ZESTRIL     TAKE these medications   acetaminophen 325 MG tablet Commonly known as: TYLENOL Take 1-2 tablets (325-650 mg total) by mouth every 6 (six) hours as needed for mild pain or moderate pain (pain score 1-3 or temp > 100.5).   amitriptyline 25 MG tablet Commonly known as: ELAVIL Take 25 mg by mouth at  bedtime.   aspirin EC 81 MG tablet Take 1 tablet (81 mg total) by mouth daily.   budesonide-formoterol 160-4.5 MCG/ACT inhaler Commonly known as: SYMBICORT Inhale 2 puffs into the lungs 2 (two) times daily.   calcium carbonate 1500 (600 Ca) MG Tabs tablet Commonly known as: Calcium 600 Take 1 tablet (1,500 mg total) by mouth 2 (two) times daily with a meal.   Cholecalciferol 25 MCG (1000 UT) tablet Take 1 tablet (1,000 Units total) by mouth daily.   diazepam 10 MG tablet Commonly known as: VALIUM Take 10 mg by mouth at bedtime as needed for anxiety or sleep. Into the vagina every night as needed for pelvic floor spasms   diphenhydrAMINE 12.5 MG/5ML elixir Commonly known as: BENADRYL Take 5-10 mLs (12.5-25 mg total) by mouth every 4 (four) hours as needed for up to 3 days for itching.   Echinacea 400 MG Caps Take 400 mg by mouth daily.   Eliquis 2.5 MG Tabs tablet Generic drug: apixaban Take 1 tablet (2.5 mg total) by mouth 2 (two) times daily.   escitalopram 20 MG tablet Commonly known as: LEXAPRO Take 20 mg by mouth daily.   fluticasone 50 MCG/ACT nasal spray Commonly known as: FLONASE Place 2 sprays into the nose daily as needed for allergies.   HYDROcodone-acetaminophen 5-325 MG tablet Commonly known as: NORCO/VICODIN Take 1-2 tablets by mouth every 6 (six) hours as needed for moderate pain or severe pain.   levocetirizine 5 MG tablet Commonly known as: XYZAL Take 1 tablet by mouth daily.   levothyroxine 50 MCG tablet Commonly known as: SYNTHROID Take 50 mcg by mouth daily.   montelukast 10 MG tablet Commonly known as: SINGULAIR Take 10 mg by mouth at bedtime.   multivitamin capsule Take 1 capsule by mouth daily.   nicotine 7 mg/24hr patch Commonly known as: NICODERM CQ - dosed in mg/24 hr Place 1 patch (7 mg total) onto the skin daily as needed (Upon request).   rosuvastatin 10 MG tablet Commonly known as: CRESTOR Take 10 mg by mouth daily.    vitamin C 1000 MG tablet Take 1,000 mg by mouth daily.   zinc gluconate 50 MG tablet Take 50 mg by mouth daily.            Durable Medical Equipment  (From admission, onward)         Start     Ordered   10/13/20 1536  For home use only DME oxygen  Once       Question Answer Comment  Length of Need Lifetime   Mode or (Route) Nasal cannula   Liters per Minute 2   Frequency Continuous (stationary and portable oxygen unit needed)   Oxygen conserving device Yes   Oxygen delivery system Gas      10/13/20 1536   10/13/20 0946  For home use only  DME oxygen  Once       Question Answer Comment  Length of Need 6 Months   Mode or (Route) Nasal cannula   Oxygen delivery system Gas      10/13/20 0945   10/13/20 0452  For home use only DME high strength lightweight manual wheelchair with seat cushion  Once       Comments: Patient suffers from hip fracture which impairs their ability to perform daily activities like bathing, dressing, and grooming in the home.  A walker will not resolve  issue with performing activities of daily living. A wheelchair will allow patient to safely perform daily activities.Length of need 6 months . (THEN ONE OF THESE TWO:) Patient self-propels the wheelchair while engaging in frequent activities such as laundry, meals, and toileting which cannot be performed in a standard or lightweight wheelchair due to the weight of the chair. Accessories: elevating leg rests (ELRs), wheel locks, extensions and anti-tippers.   10/13/20 0453   10/13/20 0451  For home use only DME wheelchair cushion (seat and back)  Once        10/13/20 0453   10/13/20 0450  For home use only DME 3 n 1  Once        10/13/20 0453   10/13/20 0450  For home use only DME Walker rolling  Once       Question Answer Comment  Walker: With 5 Inch Wheels   Patient needs a walker to treat with the following condition Fracture, hip (HCC)      10/13/20 0453           Discharge Care Instructions   (From admission, onward)         Start     Ordered   10/13/20 0000  Discharge wound care:       Comments: Per outpatient orthopedic recommendations   10/13/20 1723          Discharge Instructions: Please refer to Patient Instructions section of EMR for full details.  Patient was counseled important signs and symptoms that should prompt return to medical care, changes in medications, dietary instructions, activity restrictions, and follow up appointments.   Follow-Up Appointments:  Follow-up Information    Haddix, Gillie Manners, MD. Schedule an appointment as soon as possible for a visit in 2 week(s).   Specialty: Orthopedic Surgery Why: for repeat x-rays and wound check Contact information: [address] [place] [address] [phone]        Richmond Campbell., PA-C. Schedule an appointment as soon as possible for a visit in 1 week(s).   Specialty: Family Medicine Contact information: [address] [place] [address] [phone]        O'Neal, Ronnald Ramp, MD .   Specialties: Cardiology, Internal Medicine, Radiology Contact information: [address] [place] [address] [phone]        [facility] Follow up.   Specialty: Home Health Services Why: [facility] will be providing home health physical and occupational therapy.  The agency will contact you to schedule within 48 hours of discharge.   Contact information: [address] [place] [address] [phone]        [facility] Follow up.   Why: [facility] will be providing the durable medical equipment.  Contact the agency with any questions or concerns with equipment. Contact information: [address] [place] [address] [phone]  Arnoldo Lenis MD PGY-1, [facility] Family Medicine  Katha Cabal, DO PGY-2, [facility] Family Medicine 10/13/2020

## 2020-10-13 NOTE — Plan of Care (Signed)
  Problem: Education: Goal: Knowledge of General Education information will improve Description: Including pain rating scale, medication(s)/side effects and non-pharmacologic comfort measures 10/13/2020 1759 by Collene Gobble, RN Outcome: Adequate for Discharge 10/13/2020 1759 by Collene Gobble, RN Outcome: Adequate for Discharge   Problem: Health Behavior/Discharge Planning: Goal: Ability to manage health-related needs will improve 10/13/2020 1759 by Collene Gobble, RN Outcome: Adequate for Discharge 10/13/2020 1759 by Collene Gobble, RN Outcome: Adequate for Discharge   Problem: Clinical Measurements: Goal: Ability to maintain clinical measurements within normal limits will improve 10/13/2020 1759 by Collene Gobble, RN Outcome: Adequate for Discharge 10/13/2020 1759 by Collene Gobble, RN Outcome: Adequate for Discharge Goal: Will remain free from infection 10/13/2020 1759 by Collene Gobble, RN Outcome: Adequate for Discharge 10/13/2020 1759 by Collene Gobble, RN Outcome: Adequate for Discharge Goal: Diagnostic test results will improve 10/13/2020 1759 by Collene Gobble, RN Outcome: Adequate for Discharge 10/13/2020 1759 by Collene Gobble, RN Outcome: Adequate for Discharge Goal: Respiratory complications will improve 10/13/2020 1759 by Collene Gobble, RN Outcome: Adequate for Discharge 10/13/2020 1759 by Collene Gobble, RN Outcome: Adequate for Discharge Goal: Cardiovascular complication will be avoided 10/13/2020 1759 by Collene Gobble, RN Outcome: Adequate for Discharge 10/13/2020 1759 by Collene Gobble, RN Outcome: Adequate for Discharge   Problem: Activity: Goal: Risk for activity intolerance will decrease 10/13/2020 1759 by Collene Gobble, RN Outcome: Adequate for Discharge 10/13/2020 1759 by Collene Gobble, RN Outcome: Adequate for Discharge   Problem: Nutrition: Goal: Adequate nutrition will be maintained 10/13/2020 1759 by Collene Gobble, RN Outcome: Adequate for  Discharge 10/13/2020 1759 by Collene Gobble, RN Outcome: Adequate for Discharge   Problem: Coping: Goal: Level of anxiety will decrease 10/13/2020 1759 by Collene Gobble, RN Outcome: Adequate for Discharge 10/13/2020 1759 by Collene Gobble, RN Outcome: Adequate for Discharge   Problem: Elimination: Goal: Will not experience complications related to bowel motility 10/13/2020 1759 by Collene Gobble, RN Outcome: Adequate for Discharge 10/13/2020 1759 by Collene Gobble, RN Outcome: Adequate for Discharge Goal: Will not experience complications related to urinary retention 10/13/2020 1759 by Collene Gobble, RN Outcome: Adequate for Discharge 10/13/2020 1759 by Collene Gobble, RN Outcome: Adequate for Discharge   Problem: Pain Managment: Goal: General experience of comfort will improve 10/13/2020 1759 by Collene Gobble, RN Outcome: Adequate for Discharge 10/13/2020 1759 by Collene Gobble, RN Outcome: Adequate for Discharge   Problem: Safety: Goal: Ability to remain free from injury will improve 10/13/2020 1759 by Collene Gobble, RN Outcome: Adequate for Discharge 10/13/2020 1759 by Collene Gobble, RN Outcome: Adequate for Discharge   Problem: Skin Integrity: Goal: Risk for impaired skin integrity will decrease 10/13/2020 1759 by Collene Gobble, RN Outcome: Adequate for Discharge 10/13/2020 1759 by Collene Gobble, RN Outcome: Adequate for Discharge

## 2020-10-13 NOTE — Progress Notes (Signed)
Orthopaedic Trauma Progress Note  SUBJECTIVE: Doing fairly well, having some pain in left leg. Due for some pain medication now.  No chest pain. No SOB. No nausea/vomiting. No other complaints. Working well with therapies. Feels ready to go home.  OBJECTIVE:  Vitals:   10/13/20 0500 10/13/20 0730  BP: 107/62 96/68  Pulse: 77 92  Resp: 18 18  Temp: 98.8 F (37.1 C) 98.6 F (37 C)  SpO2: 92% 92%    General: Sitting up in bed, no acute distress Respiratory: No increased work of breathing.  Left lower extremity: Dressings removed, incisions clean, dry, intact.  No significant tenderness with palpation throughout extremity. Compartments are soft and compressible.  Ankle dorsiflexion/plantarflexion is intact.  Foot warm and well-perfused.  Neurovascularly intact   IMAGING: Stable post op imaging.   LABS:  Results for orders placed or performed during the hospital encounter of 10/10/20 (from the past 24 hour(s))  Glucose, capillary     Status: Abnormal   Collection Time: 10/12/20 12:29 PM  Result Value Ref Range   Glucose-Capillary 104 (H) 70 - 99 mg/dL  Glucose, capillary     Status: Abnormal   Collection Time: 10/12/20  4:18 PM  Result Value Ref Range   Glucose-Capillary 116 (H) 70 - 99 mg/dL  Glucose, capillary     Status: Abnormal   Collection Time: 10/12/20  8:02 PM  Result Value Ref Range   Glucose-Capillary 103 (H) 70 - 99 mg/dL  Basic metabolic panel     Status: Abnormal   Collection Time: 10/13/20  2:49 AM  Result Value Ref Range   Sodium 136 135 - 145 mmol/L   Potassium 4.1 3.5 - 5.1 mmol/L   Chloride 99 98 - 111 mmol/L   CO2 31 22 - 32 mmol/L   Glucose, Bld 104 (H) 70 - 99 mg/dL   BUN 11 8 - 23 mg/dL   Creatinine, Ser 4.09 0.44 - 1.00 mg/dL   Calcium 8.4 (L) 8.9 - 10.3 mg/dL   GFR, Estimated >81 >19 mL/min   Anion gap 6 5 - 15  CBC     Status: Abnormal   Collection Time: 10/13/20  2:49 AM  Result Value Ref Range   WBC 6.3 4.0 - 10.5 K/uL   RBC 3.22 (L) 3.87 -  5.11 MIL/uL   Hemoglobin 10.5 (L) 12.0 - 15.0 g/dL   HCT 14.7 (L) 82.9 - 56.2 %   MCV 99.1 80.0 - 100.0 fL   MCH 32.6 26.0 - 34.0 pg   MCHC 32.9 30.0 - 36.0 g/dL   RDW 13.0 86.5 - 78.4 %   Platelets  150 - 400 K/uL    PLATELET CLUMPS NOTED ON SMEAR, COUNT APPEARS DECREASED   nRBC 0.0 0.0 - 0.2 %  Glucose, capillary     Status: None   Collection Time: 10/13/20  6:39 AM  Result Value Ref Range   Glucose-Capillary 96 70 - 99 mg/dL    ASSESSMENT: Melody White is a 70 y.o. female, 2 Days Post-Op s/p INTRAMEDULLARY NAIL LEFT INTERTROCHANTERIC  FEMUR FRACTURE  CV/Blood loss: Acute blood loss anemia, Hgb 10.5 this morning. Hemodynamically stable  PLAN: Weightbearing: WBAT LLE Incisional and dressing care: Reinforce dressings as needed  Showering: Patient okay to begin showering 10/14/2020 if no drainage from incisions Orthopedic device(s): None  Pain management:  1. Tylenol 325-650 mg q 6 hours PRN 3. Norco 5-325 mg q 4 hours PRN  VTE prophylaxis: Lovenox, SCDs ID:  Ancef 2gm post op completed Foley/Lines:  No foley, KVO IVFs Impediments to Fracture Healing: Vitamin D level 77, no supplementation needed Dispo:  Therapies as tolerated, PT/OT recommending HH. Ok for d/c from ortho standpoint once cleared by therapies and medicine team. Have sent d/c rx for pain medication and DVT prophylaxis to pharmacy Follow - up plan: 2 weeks after discharge for repeat x-rays and wound check  Contact information:  Truitt Merle MD, Ulyses Southward PA-C. After hours and holidays please check [email or web address] for group call information for [facility]   Sophiea Ueda A. Michaelyn Barter, PA-C [phone] (office) [email or web address]

## 2020-10-13 NOTE — Progress Notes (Signed)
Physical Therapy Treatment Patient Details Name: Melody White MRN: [id number] DOB: 09/05/1950 Today's Date: 10/13/2020    History of Present Illness Pt is a 70 y.o. F who presents with left intertrochanteric femur fracture s/p cephalomedullary nailing 10/11/20. Significant PMH: HTN, stroke.    PT Comments    Pt seated in recliner.  Pt reports pain but improved with movement.  Pt able to progress gt distance. Noted desaturation with activity continue to recommend HHPT.     Follow Up Recommendations  Home health PT;Supervision for mobility/OOB     Equipment Recommendations  Rolling walker with 5" wheels;3in1 (PT)    Recommendations for Other Services       Precautions / Restrictions Precautions Precautions: Fall;Other (comment) Precaution Comments: watch O2 sats Restrictions Weight Bearing Restrictions: Yes LLE Weight Bearing: Weight bearing as tolerated    Mobility  Bed Mobility               General bed mobility comments: Pt seated in recliner on arrival    Transfers Overall transfer level: Needs assistance Equipment used: Rolling walker (2 wheeled) Transfers: Sit to/from Stand Sit to Stand: Supervision         General transfer comment: Increased time. supervision for safety.  Ambulation/Gait Ambulation/Gait assistance: Supervision Gait Distance (Feet): 200 Feet Assistive device: Rolling walker (2 wheeled) Gait Pattern/deviations: Decreased stance time - left;Decreased weight shift to left;Antalgic;Narrow base of support;Step-through pattern Gait velocity: decreased   General Gait Details: Cues for sequencing and pushing down through RW to offset weight. Pt able to progress distance and advance to step through pattern.   Stairs             Wheelchair Mobility    Modified Rankin (Stroke Patients Only)       Balance Overall balance assessment: Needs assistance Sitting-balance support: Feet supported Sitting balance-Leahy Scale: Good        Standing balance-Leahy Scale: Fair                              Cognition Arousal/Alertness: Awake/alert Behavior During Therapy: WFL for tasks assessed/performed Overall Cognitive Status: Within Functional Limits for tasks assessed                                        Exercises General Exercises - Lower Extremity Ankle Circles/Pumps: AROM;Both;10 reps;Supine Quad Sets: AROM;Left;10 reps;Supine Long Arc Quad: AROM;Left;10 reps;Seated Heel Slides: Left;10 reps;AAROM;Supine Hip ABduction/ADduction: AAROM;Supine;Left;10 reps Other Exercises Other Exercises: IS x 10 reps 1000-1250 ml    General Comments        Pertinent Vitals/Pain Faces Pain Scale: Hurts a little bit Pain Location: L hip Pain Descriptors / Indicators: Discomfort;Grimacing Pain Intervention(s): Monitored during session;Repositioned    Home Living                      Prior Function            PT Goals (current goals can now be found in the care plan section) Acute Rehab PT Goals Patient Stated Goal: to get up and walk Potential to Achieve Goals: Good Progress towards PT goals: Progressing toward goals    Frequency    Min 5X/week      PT Plan Current plan remains appropriate    Co-evaluation  AM-PAC PT "6 Clicks" Mobility   Outcome Measure  Help needed turning from your back to your side while in a flat bed without using bedrails?: A Little Help needed moving from lying on your back to sitting on the side of a flat bed without using bedrails?: A Little Help needed moving to and from a bed to a chair (including a wheelchair)?: A Little Help needed standing up from a chair using your arms (e.g., wheelchair or bedside chair)?: A Little Help needed to walk in hospital room?: A Little Help needed climbing 3-5 steps with a railing? : A Little 6 Click Score: 18    End of Session Equipment Utilized During Treatment: Gait  belt;Oxygen Activity Tolerance: Patient tolerated treatment well Patient left: in chair;with call bell/phone within reach;with chair alarm set Nurse Communication: Mobility status PT Visit Diagnosis: Difficulty in walking, not elsewhere classified (R26.2);Pain Pain - Right/Left: Left Pain - part of body: Hip     Time: 2458-0998 PT Time Calculation (min) (ACUTE ONLY): 47 min  Charges:  $Gait Training: 8-22 mins $Therapeutic Exercise: 8-22 mins $Therapeutic Activity: 8-22 mins                     Bonney Leitz , PTA Acute Rehabilitation Services Pager [phone] Office [phone]     Haden Cavenaugh Artis Delay 10/13/2020, 1:30 PM

## 2020-10-14 ENCOUNTER — Other Ambulatory Visit (HOSPITAL_COMMUNITY): Payer: Self-pay

## 2020-10-19 ENCOUNTER — Ambulatory Visit (HOSPITAL_BASED_OUTPATIENT_CLINIC_OR_DEPARTMENT_OTHER)
Admission: RE | Admit: 2020-10-19 | Discharge: 2020-10-19 | Disposition: A | Discharge status: Home / Self Care | Payer: Medicare Other | Source: Ambulatory Visit | Attending: Family Medicine | Admitting: Family Medicine

## 2020-10-19 ENCOUNTER — Other Ambulatory Visit (HOSPITAL_BASED_OUTPATIENT_CLINIC_OR_DEPARTMENT_OTHER): Payer: Self-pay | Admitting: Family Medicine

## 2020-10-19 ENCOUNTER — Other Ambulatory Visit: Payer: Self-pay

## 2020-10-19 DIAGNOSIS — M7989 Other specified soft tissue disorders: Secondary | ICD-10-CM

## 2020-10-21 ENCOUNTER — Telehealth (HOSPITAL_COMMUNITY): Payer: Self-pay | Admitting: Pharmacist

## 2020-10-21 NOTE — Telephone Encounter (Signed)
Pharmacy Transitions of Care Follow-up Telephone Call  Date of discharge:  10/13/2020  How have you been since you were released from the hospital? Good, leg still a little sore   Medication changes made at discharge:  - START: Eliquis, Norco, Vit D,    - STOPPED: Ibuprofen, lisinopril  Medication changes verified by the patient? Yes   Medication Accessibility:  Was the patient provided with refills on discharged medications? No   Medication Review:  APIXABAN (ELIQUIS)  Apixaban 10 mg BID initiated on 10/13/2020. Will switch to apixaban 5 mg BID after 7 days (DATE).  - Discussed importance of taking medication around the same time everyday  - Reviewed potential DDIs with patient  - Advised patient of medications to avoid (NSAIDs, ASA)  - Educated that Tylenol (acetaminophen) will be the preferred analgesic to prevent risk of bleeding  - Emphasized importance of monitoring for signs and symptoms of bleeding (abnormal bruising, prolonged bleeding, nose bleeds, bleeding from gums, discolored urine, black tarry stools)  - Advised patient to alert all providers of anticoagulation therapy prior to starting a new medication or having a procedure    Follow-up Appointments:  PCP Hospital f/u appt confirmed?  Yes Specialist Hospital f/u appt confirmed? Yes  If their condition worsens, is the pt aware to call PCP or go to the Emergency Dept.? Yes  Final Patient Assessment: Patient reports she is doing good, some still pain in the operated area.  Reviewed new medications and possible side effects.

## 2020-12-23 ENCOUNTER — Ambulatory Visit: Payer: Medicare Other | Admitting: Physician Assistant

## 2021-02-01 ENCOUNTER — Other Ambulatory Visit: Payer: Medicare Other

## 2021-02-01 ENCOUNTER — Ambulatory Visit: Payer: Medicare Other

## 2021-02-14 IMAGING — MG MM DIGITAL SCREENING BILAT W/ TOMO W/ CAD
6 of 10 series · 6 of 30 positions shown · non-contrast
Comparison: Previous exam(s).

CLINICAL DATA: Screening.

EXAM:
DIGITAL SCREENING BILATERAL MAMMOGRAM WITH TOMO AND CAD

[R CC synth-2D (1 of 2)]
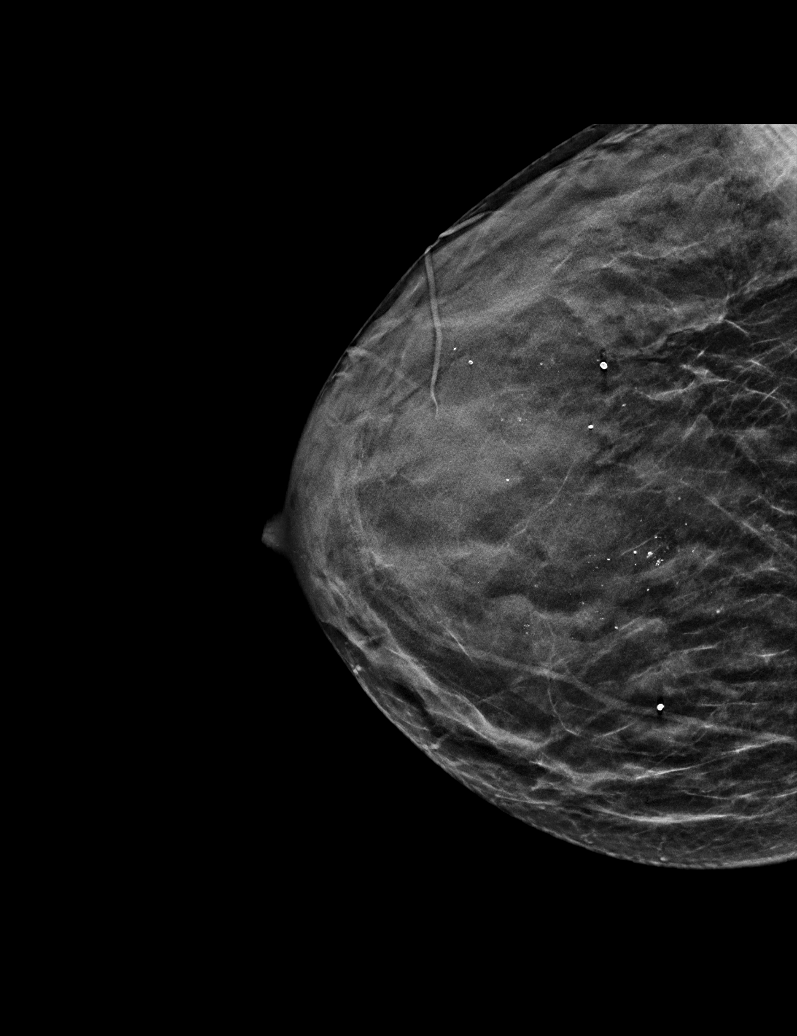

[R MLO synth-2D]
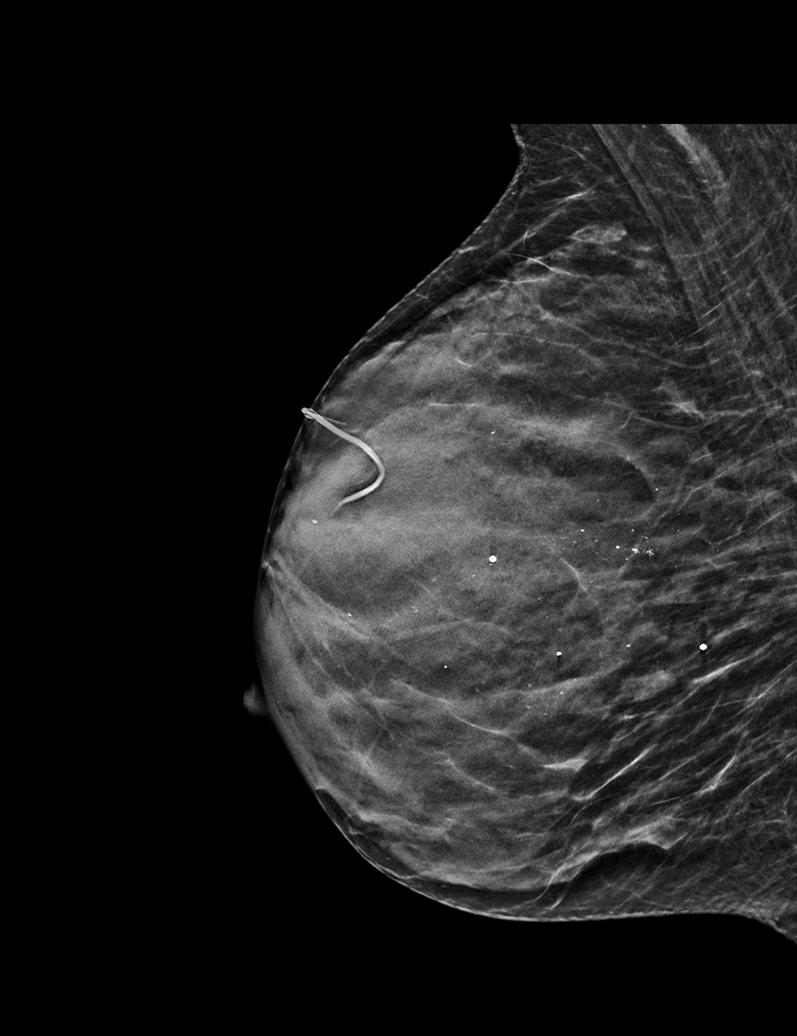

[R CC synth-2D (2 of 2)]
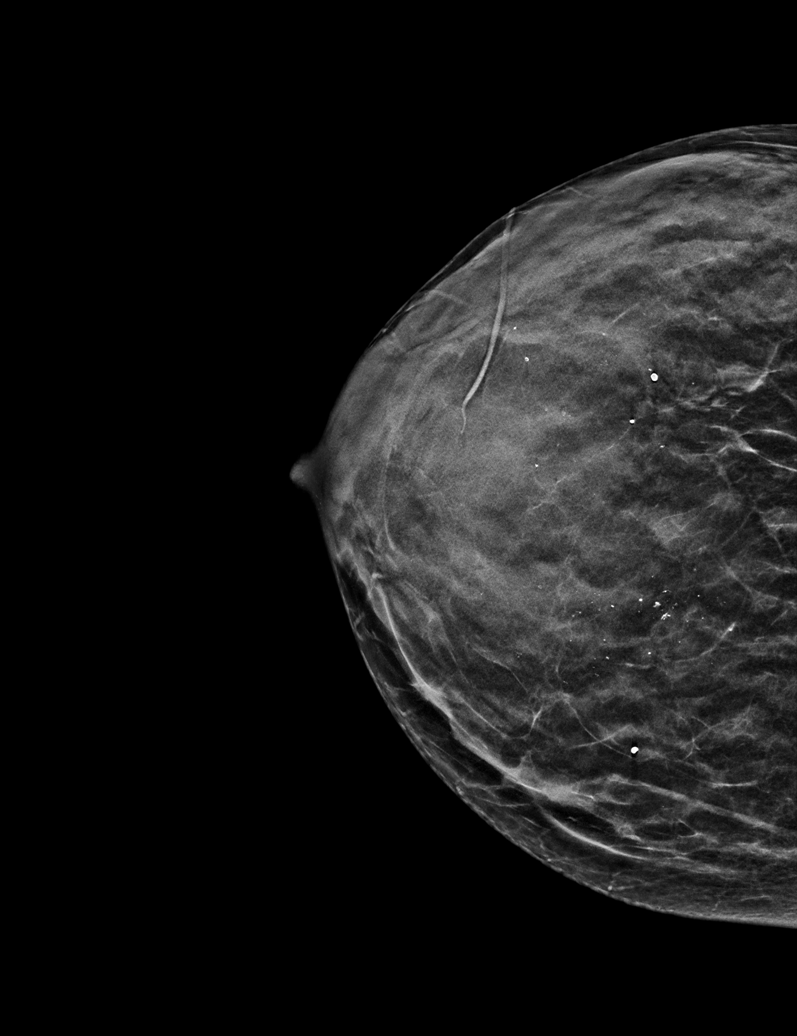

[L MLO synth-2D]
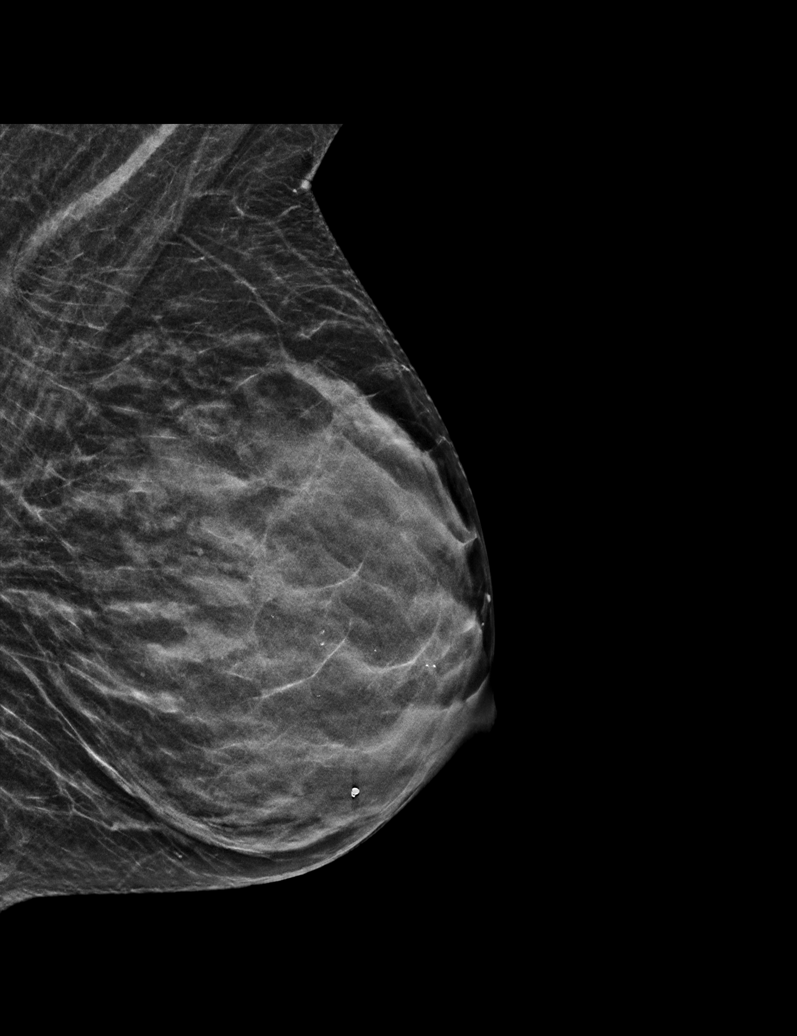

[L CC synth-2D]
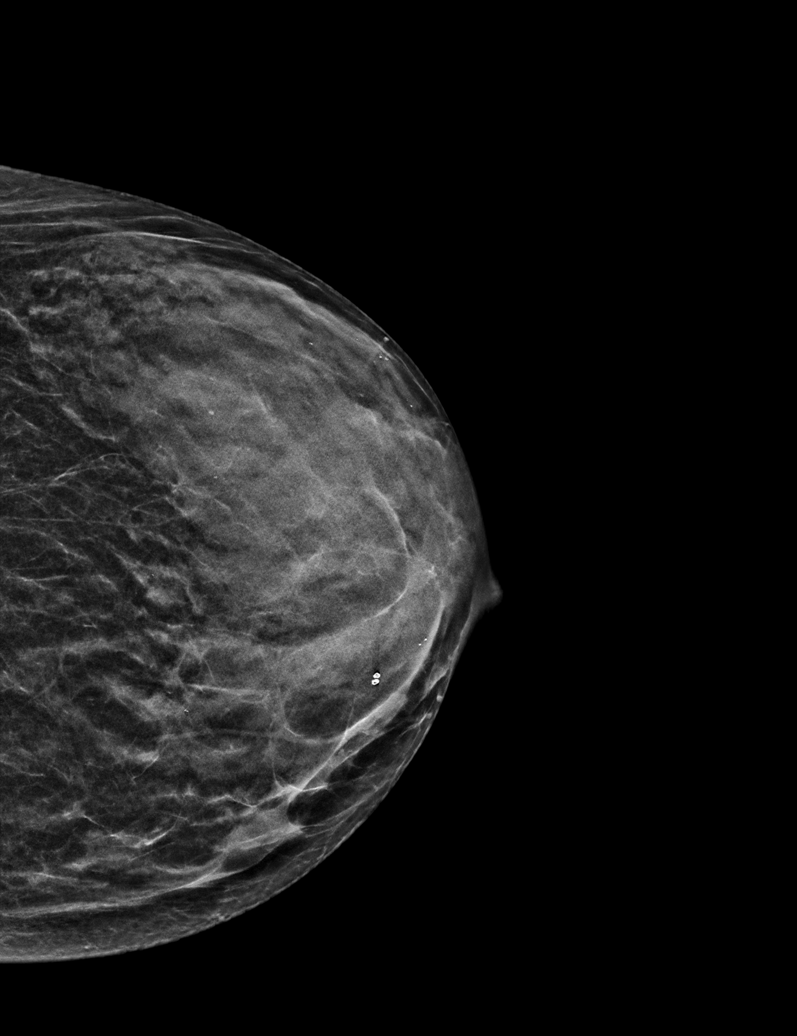

[R MLO tomo · tomo slice 19/38.0]
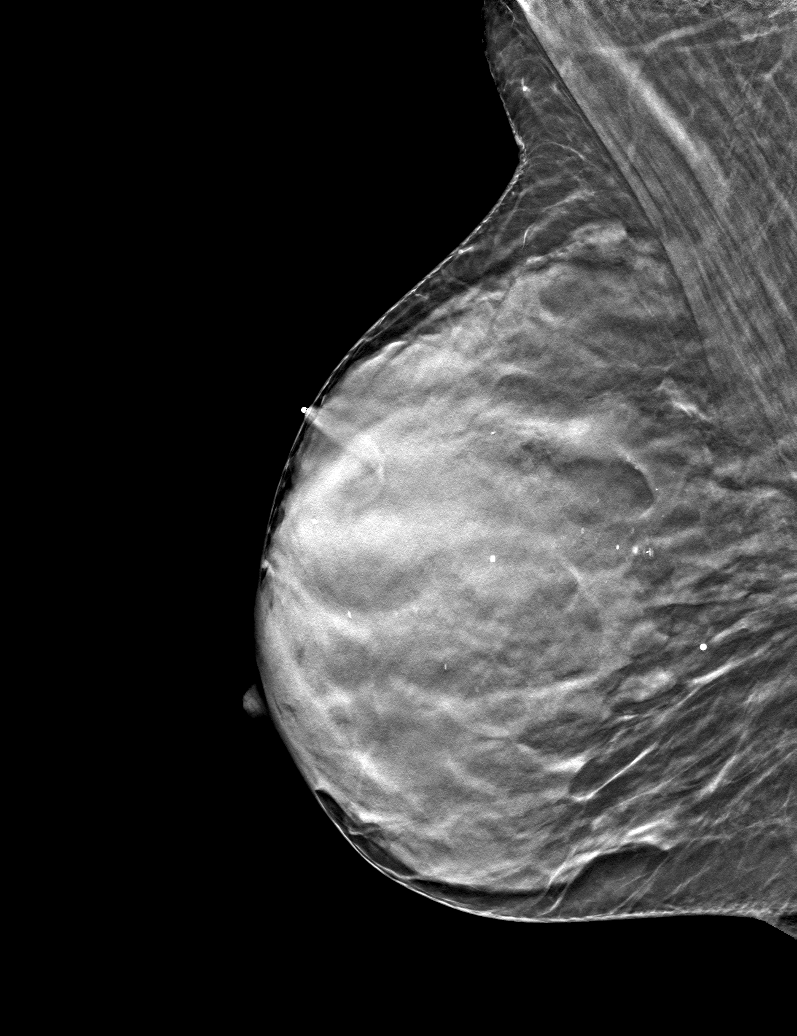

[6 of 30 positions shown; findings below may reference images not displayed]

ACR Breast Density Category d: The breast tissue is extremely dense,
which lowers the sensitivity of mammography
FINDINGS: There are no findings suspicious for malignancy. Images were
processed with CAD.
IMPRESSION: No mammographic evidence of malignancy. A result letter of this
screening mammogram will be mailed directly to the patient.

RECOMMENDATION:
Screening mammogram in one year. (Code:[id number]-[id number])

BI-RADS CATEGORY  1: Negative.

## 2021-06-02 ENCOUNTER — Other Ambulatory Visit: Payer: Self-pay

## 2021-06-02 ENCOUNTER — Ambulatory Visit
Admission: RE | Admit: 2021-06-02 | Discharge: 2021-06-02 | Disposition: A | Discharge status: Home / Self Care | Payer: Medicare Other | Source: Ambulatory Visit | Attending: Family Medicine | Admitting: Family Medicine

## 2021-06-02 DIAGNOSIS — Z1231 Encounter for screening mammogram for malignant neoplasm of breast: Secondary | ICD-10-CM

## 2021-06-02 DIAGNOSIS — E2839 Other primary ovarian failure: Secondary | ICD-10-CM

## 2021-07-06 ENCOUNTER — Other Ambulatory Visit: Payer: Medicare Other

## 2021-07-06 ENCOUNTER — Ambulatory Visit: Payer: Medicare Other

## 2021-08-31 ENCOUNTER — Encounter: Payer: Self-pay | Admitting: Emergency Medicine

## 2021-08-31 ENCOUNTER — Inpatient Hospital Stay (HOSPITAL_COMMUNITY)
Admission: EM | Admit: 2021-08-31 | Discharge: 2021-09-04 | DRG: 193 | Disposition: A | Discharge status: Home / Self Care | Payer: Medicare Other | Attending: Family Medicine | Admitting: Family Medicine

## 2021-08-31 ENCOUNTER — Other Ambulatory Visit: Payer: Self-pay

## 2021-08-31 ENCOUNTER — Ambulatory Visit
Admission: EM | Admit: 2021-08-31 | Discharge: 2021-08-31 | Disposition: A | Discharge status: Nursing Home (Includes ICF) | Payer: Medicare Other | Attending: Family Medicine | Admitting: Family Medicine

## 2021-08-31 ENCOUNTER — Emergency Department (HOSPITAL_COMMUNITY): Payer: Medicare Other

## 2021-08-31 ENCOUNTER — Encounter (HOSPITAL_COMMUNITY): Payer: Self-pay | Admitting: Emergency Medicine

## 2021-08-31 DIAGNOSIS — Z90710 Acquired absence of both cervix and uterus: Secondary | ICD-10-CM

## 2021-08-31 DIAGNOSIS — J189 Pneumonia, unspecified organism: Principal | ICD-10-CM | POA: Diagnosis present

## 2021-08-31 DIAGNOSIS — F17210 Nicotine dependence, cigarettes, uncomplicated: Secondary | ICD-10-CM | POA: Diagnosis present

## 2021-08-31 DIAGNOSIS — R0602 Shortness of breath: Secondary | ICD-10-CM | POA: Diagnosis not present

## 2021-08-31 DIAGNOSIS — R0902 Hypoxemia: Secondary | ICD-10-CM | POA: Diagnosis not present

## 2021-08-31 DIAGNOSIS — Z79890 Hormone replacement therapy (postmenopausal): Secondary | ICD-10-CM

## 2021-08-31 DIAGNOSIS — Z8673 Personal history of transient ischemic attack (TIA), and cerebral infarction without residual deficits: Secondary | ICD-10-CM

## 2021-08-31 DIAGNOSIS — J441 Chronic obstructive pulmonary disease with (acute) exacerbation: Secondary | ICD-10-CM | POA: Diagnosis present

## 2021-08-31 DIAGNOSIS — J9601 Acute respiratory failure with hypoxia: Secondary | ICD-10-CM | POA: Diagnosis present

## 2021-08-31 DIAGNOSIS — J440 Chronic obstructive pulmonary disease with acute lower respiratory infection: Secondary | ICD-10-CM | POA: Diagnosis present

## 2021-08-31 DIAGNOSIS — I700 Atherosclerosis of aorta: Secondary | ICD-10-CM

## 2021-08-31 DIAGNOSIS — Z7951 Long term (current) use of inhaled steroids: Secondary | ICD-10-CM

## 2021-08-31 DIAGNOSIS — Z79899 Other long term (current) drug therapy: Secondary | ICD-10-CM

## 2021-08-31 DIAGNOSIS — F418 Other specified anxiety disorders: Secondary | ICD-10-CM | POA: Diagnosis present

## 2021-08-31 DIAGNOSIS — Z720 Tobacco use: Secondary | ICD-10-CM | POA: Diagnosis present

## 2021-08-31 DIAGNOSIS — Z20822 Contact with and (suspected) exposure to covid-19: Secondary | ICD-10-CM | POA: Diagnosis present

## 2021-08-31 DIAGNOSIS — E785 Hyperlipidemia, unspecified: Secondary | ICD-10-CM

## 2021-08-31 DIAGNOSIS — Z7982 Long term (current) use of aspirin: Secondary | ICD-10-CM

## 2021-08-31 DIAGNOSIS — E039 Hypothyroidism, unspecified: Secondary | ICD-10-CM | POA: Diagnosis present

## 2021-08-31 DIAGNOSIS — I10 Essential (primary) hypertension: Secondary | ICD-10-CM | POA: Diagnosis present

## 2021-08-31 DIAGNOSIS — E876 Hypokalemia: Secondary | ICD-10-CM | POA: Diagnosis present

## 2021-08-31 DIAGNOSIS — Z7983 Long term (current) use of bisphosphonates: Secondary | ICD-10-CM

## 2021-08-31 DIAGNOSIS — J449 Chronic obstructive pulmonary disease, unspecified: Secondary | ICD-10-CM

## 2021-08-31 LAB — BASIC METABOLIC PANEL
Anion gap: 9 (ref 5–15)
BUN: 18 mg/dL (ref 8–23)
CO2: 26 mmol/L (ref 22–32)
Calcium: 8.5 mg/dL — ABNORMAL LOW (ref 8.9–10.3)
Chloride: 104 mmol/L (ref 98–111)
Creatinine, Ser: 0.88 mg/dL (ref 0.44–1.00)
GFR, Estimated: >60 mL/min (ref >60)
Glucose, Bld: 133 mg/dL — ABNORMAL HIGH (ref 70–99)
Potassium: 3.1 mmol/L — ABNORMAL LOW (ref 3.5–5.1)
Sodium: 139 mmol/L (ref 135–145)

## 2021-08-31 LAB — CBC
HCT: 44 % (ref 36.0–46.0)
Hemoglobin: 13.9 g/dL (ref 12.0–15.0)
MCH: 30.2 pg (ref 26.0–34.0)
MCHC: 31.6 g/dL (ref 30.0–36.0)
MCV: 95.4 fL (ref 80.0–100.0)
Platelets: 226 10*3/uL (ref 150–400)
RBC: 4.61 MIL/uL (ref 3.87–5.11)
RDW: 14.6 % (ref 11.5–15.5)
WBC: 8 10*3/uL (ref 4.0–10.5)
nRBC: 0 % (ref 0.0–0.2)

## 2021-08-31 LAB — BRAIN NATRIURETIC PEPTIDE: B Natriuretic Peptide: 85 pg/mL (ref 0.0–100.0)

## 2021-08-31 LAB — TROPONIN I (HIGH SENSITIVITY)
Troponin I (High Sensitivity): 5 ng/L (ref <18)
Troponin I (High Sensitivity): 5 ng/L (ref <18)

## 2021-08-31 MED ORDER — IPRATROPIUM-ALBUTEROL 0.5-2.5 (3) MG/3ML IN SOLN: 3.0000 mL | Freq: Once | RESPIRATORY_TRACT | Status: AC

## 2021-08-31 MED ORDER — ALBUTEROL SULFATE (2.5 MG/3ML) 0.083% IN NEBU
INHALATION_SOLUTION | RESPIRATORY_TRACT | Status: AC
  Filled 2021-08-31: qty 3

## 2021-08-31 MED ORDER — IPRATROPIUM-ALBUTEROL 0.5-2.5 (3) MG/3ML IN SOLN
RESPIRATORY_TRACT | Status: AC
  Administered 2021-08-31: 3 mL via RESPIRATORY_TRACT
  Filled 2021-08-31: qty 3

## 2021-08-31 MED ORDER — ALBUTEROL SULFATE (2.5 MG/3ML) 0.083% IN NEBU
2.5000 mg | INHALATION_SOLUTION | Freq: Once | RESPIRATORY_TRACT | Status: AC
  Administered 2021-08-31: 2.5 mg via RESPIRATORY_TRACT

## 2021-08-31 NOTE — ED Notes (Addendum)
Pt niece, Claris Gower, who is with here with pt at this time, but needs to go back home- number added to chart demographics- she requested to be called with pt dispo ?

## 2021-08-31 NOTE — ED Notes (Signed)
Patient transported to X-ray 

## 2021-08-31 NOTE — ED Triage Notes (Signed)
Pt reports cough, congestion, shortness of breath x2 weeks. Pt reports worsening of symptoms in the last week and reports did a virtual visit last week and was started on doxycycline.  ?

## 2021-08-31 NOTE — ED Notes (Signed)
Patient is being discharged from the Urgent Care and sent to the Emergency Department via POV . Per MD, patient is in need of higher level of care due to hypoxia,shortness of breath x2 weeks. Patient is aware and verbalizes understanding of plan of care.  ?Vitals:  ? 08/31/21 1932  ?BP: 121/66  ?Pulse: 92  ?Resp: (!) 22  ?Temp: 98.1 ?F (36.7 ?C)  ?SpO2: (!) 87%  ?  ?

## 2021-08-31 NOTE — ED Notes (Signed)
RT at bedside.

## 2021-08-31 NOTE — ED Triage Notes (Signed)
Pt c/o sob for about 2 weeks with cough and congestion.  ?

## 2021-08-31 NOTE — Progress Notes (Signed)
RT called to assess patient, pt on 6lpm cann spo2 98% pt has congested cough with wheezing and rhonchi, RT order breathing treatment and will turn o2 to 4lpm cann, pt has significant smoking history  ?

## 2021-09-01 DIAGNOSIS — J441 Chronic obstructive pulmonary disease with (acute) exacerbation: Secondary | ICD-10-CM

## 2021-09-01 LAB — RESP PANEL BY RT-PCR (FLU A&B, COVID) ARPGX2
Influenza A by PCR: NEGATIVE
Influenza B by PCR: NEGATIVE
SARS Coronavirus 2 by RT PCR: NEGATIVE

## 2021-09-01 MED ORDER — LEVOTHYROXINE SODIUM 75 MCG PO TABS
75.0000 ug | ORAL_TABLET | Freq: Every day | ORAL | Status: DC
  Administered 2021-09-01 – 2021-09-04 (×4): 75 ug via ORAL
  Filled 2021-09-01: qty 1
  Filled 2021-09-01: qty 2
  Filled 2021-09-01 (×2): qty 1

## 2021-09-01 MED ORDER — IPRATROPIUM-ALBUTEROL 0.5-2.5 (3) MG/3ML IN SOLN
3.0000 mL | Freq: Four times a day (QID) | RESPIRATORY_TRACT | Status: DC
Start: 2021-09-01 — End: 2021-09-01
  Administered 2021-09-01 (×3): 3 mL via RESPIRATORY_TRACT
  Filled 2021-09-01 (×2): qty 3

## 2021-09-01 MED ORDER — POLYETHYLENE GLYCOL 3350 17 G PO PACK: 17.0000 g | PACK | Freq: Every day | ORAL | Status: DC | PRN

## 2021-09-01 MED ORDER — DOXYCYCLINE HYCLATE 100 MG PO TABS
100.0000 mg | ORAL_TABLET | Freq: Two times a day (BID) | ORAL | Status: DC
  Administered 2021-09-01 – 2021-09-04 (×7): 100 mg via ORAL
  Filled 2021-09-01 (×7): qty 1

## 2021-09-01 MED ORDER — CEFTRIAXONE SODIUM 1 G IJ SOLR
1.0000 g | Freq: Once | INTRAMUSCULAR | Status: AC
  Administered 2021-09-01: 1 g via INTRAVENOUS
  Filled 2021-09-01: qty 10

## 2021-09-01 MED ORDER — IPRATROPIUM-ALBUTEROL 0.5-2.5 (3) MG/3ML IN SOLN
3.0000 mL | Freq: Three times a day (TID) | RESPIRATORY_TRACT | Status: DC
  Administered 2021-09-02 – 2021-09-04 (×8): 3 mL via RESPIRATORY_TRACT
  Filled 2021-09-01 (×8): qty 3

## 2021-09-01 MED ORDER — ACETAMINOPHEN 325 MG PO TABS
650.0000 mg | ORAL_TABLET | Freq: Four times a day (QID) | ORAL | Status: DC | PRN
Start: 2021-09-01 — End: 2021-09-04
  Administered 2021-09-01 – 2021-09-04 (×3): 650 mg via ORAL
  Filled 2021-09-01 (×3): qty 2

## 2021-09-01 MED ORDER — ONDANSETRON HCL 4 MG PO TABS: 4.0000 mg | ORAL_TABLET | Freq: Four times a day (QID) | ORAL | Status: DC | PRN

## 2021-09-01 MED ORDER — SODIUM CHLORIDE 0.9% FLUSH: 3.0000 mL | INTRAVENOUS | Status: DC | PRN

## 2021-09-01 MED ORDER — SODIUM CHLORIDE 0.9% FLUSH
3.0000 mL | Freq: Two times a day (BID) | INTRAVENOUS | Status: DC
  Administered 2021-09-01 – 2021-09-04 (×5): 3 mL via INTRAVENOUS

## 2021-09-01 MED ORDER — ALBUTEROL SULFATE (2.5 MG/3ML) 0.083% IN NEBU: 2.5000 mg | INHALATION_SOLUTION | RESPIRATORY_TRACT | Status: DC | PRN

## 2021-09-01 MED ORDER — HEPARIN SODIUM (PORCINE) 5000 UNIT/ML IJ SOLN
5000.0000 [IU] | Freq: Three times a day (TID) | INTRAMUSCULAR | Status: DC
  Administered 2021-09-01 – 2021-09-04 (×8): 5000 [IU] via SUBCUTANEOUS
  Filled 2021-09-01 (×8): qty 1

## 2021-09-01 MED ORDER — ALBUTEROL (5 MG/ML) CONTINUOUS INHALATION SOLN: 10.0000 mg/h | INHALATION_SOLUTION | Freq: Once | RESPIRATORY_TRACT | Status: DC

## 2021-09-01 MED ORDER — SODIUM CHLORIDE 0.9 % IV SOLN: 250.0000 mL | INTRAVENOUS | Status: DC | PRN

## 2021-09-01 MED ORDER — ATORVASTATIN CALCIUM 10 MG PO TABS
20.0000 mg | ORAL_TABLET | Freq: Every day | ORAL | Status: DC
  Administered 2021-09-02 – 2021-09-04 (×3): 20 mg via ORAL
  Filled 2021-09-01 (×3): qty 2

## 2021-09-01 MED ORDER — HYDROXYZINE HCL 25 MG PO TABS: 25.0000 mg | ORAL_TABLET | Freq: Three times a day (TID) | ORAL | Status: DC | PRN

## 2021-09-01 MED ORDER — SODIUM CHLORIDE 0.9% FLUSH
3.0000 mL | Freq: Two times a day (BID) | INTRAVENOUS | Status: DC
  Administered 2021-09-01 – 2021-09-02 (×2): 3 mL via INTRAVENOUS

## 2021-09-01 MED ORDER — POTASSIUM CHLORIDE CRYS ER 20 MEQ PO TBCR
40.0000 meq | EXTENDED_RELEASE_TABLET | Freq: Once | ORAL | Status: AC
  Administered 2021-09-01: 40 meq via ORAL
  Filled 2021-09-01: qty 2

## 2021-09-01 MED ORDER — ASPIRIN EC 81 MG PO TBEC
81.0000 mg | DELAYED_RELEASE_TABLET | Freq: Every day | ORAL | Status: DC
  Administered 2021-09-01 – 2021-09-04 (×4): 81 mg via ORAL
  Filled 2021-09-01 (×4): qty 1

## 2021-09-01 MED ORDER — ACETAMINOPHEN 650 MG RE SUPP: 650.0000 mg | Freq: Four times a day (QID) | RECTAL | Status: DC | PRN

## 2021-09-01 MED ORDER — TRAZODONE HCL 50 MG PO TABS: 50.0000 mg | ORAL_TABLET | Freq: Every evening | ORAL | Status: DC | PRN

## 2021-09-01 MED ORDER — BISACODYL 10 MG RE SUPP: 10.0000 mg | Freq: Every day | RECTAL | Status: DC | PRN

## 2021-09-01 MED ORDER — ESCITALOPRAM OXALATE 10 MG PO TABS
20.0000 mg | ORAL_TABLET | Freq: Every day | ORAL | Status: DC
  Administered 2021-09-01 – 2021-09-04 (×4): 20 mg via ORAL
  Filled 2021-09-01 (×4): qty 2

## 2021-09-01 MED ORDER — ONDANSETRON HCL 4 MG/2ML IJ SOLN
4.0000 mg | Freq: Four times a day (QID) | INTRAMUSCULAR | Status: DC | PRN
Start: 2021-09-01 — End: 2021-09-04

## 2021-09-01 MED ORDER — METHYLPREDNISOLONE SODIUM SUCC 125 MG IJ SOLR
125.0000 mg | Freq: Once | INTRAMUSCULAR | Status: AC
Start: 2021-09-01 — End: 2021-09-01
  Administered 2021-09-01: 125 mg via INTRAVENOUS
  Filled 2021-09-01: qty 2

## 2021-09-01 MED ORDER — ALBUTEROL SULFATE (2.5 MG/3ML) 0.083% IN NEBU
10.0000 mg | INHALATION_SOLUTION | Freq: Once | RESPIRATORY_TRACT | Status: AC
  Administered 2021-09-01: 10 mg via RESPIRATORY_TRACT
  Filled 2021-09-01: qty 12

## 2021-09-01 MED ORDER — DOXYCYCLINE HYCLATE 100 MG PO TABS
100.0000 mg | ORAL_TABLET | Freq: Once | ORAL | Status: AC
  Administered 2021-09-01: 100 mg via ORAL
  Filled 2021-09-01: qty 1

## 2021-09-01 MED ORDER — CEFTRIAXONE SODIUM 1 G IJ SOLR: 1.0000 g | INTRAMUSCULAR | Status: DC

## 2021-09-01 MED ORDER — DM-GUAIFENESIN ER 30-600 MG PO TB12
1.0000 | ORAL_TABLET | Freq: Two times a day (BID) | ORAL | Status: DC
  Administered 2021-09-01 – 2021-09-04 (×7): 1 via ORAL
  Filled 2021-09-01 (×7): qty 1

## 2021-09-01 MED ORDER — NICOTINE 21 MG/24HR TD PT24
21.0000 mg | MEDICATED_PATCH | Freq: Every day | TRANSDERMAL | Status: DC
  Administered 2021-09-01 – 2021-09-04 (×4): 21 mg via TRANSDERMAL
  Filled 2021-09-01 (×4): qty 1

## 2021-09-01 MED ORDER — METHYLPREDNISOLONE SODIUM SUCC 40 MG IJ SOLR
40.0000 mg | Freq: Two times a day (BID) | INTRAMUSCULAR | Status: DC
  Administered 2021-09-01 – 2021-09-04 (×7): 40 mg via INTRAVENOUS
  Filled 2021-09-01 (×7): qty 1

## 2021-09-01 MED ORDER — SODIUM CHLORIDE 0.9 % IV SOLN
2.0000 g | Freq: Every day | INTRAVENOUS | Status: DC
  Administered 2021-09-02 – 2021-09-04 (×3): 2 g via INTRAVENOUS
  Filled 2021-09-01 (×3): qty 20

## 2021-09-01 NOTE — ED Provider Notes (Signed)
?[facility] EMERGENCY DEPARTMENT ?Provider Note ? ? ?CSN: [id number] ?Arrival date & time: 08/31/21  1947 ? ?  ? ?History ? ?Chief Complaint  ?Patient presents with  ? Shortness of Breath  ? ? ?Melody CakeLinda F Pabst is a 71 y.o. female. ? ?Patient presents to the emergency department for evaluation of shortness of breath, ongoing for more than a week.  Patient denies ongoing fever, chest pain.  She has had associated cough.  Symptoms worsened over the last few days.  She is a smoker, uses an inhaler at home.  Has not had relief with her inhaler. ? ? ?  ? ?Home Medications ?Prior to Admission medications   ?Medication Sig Start Date End Date Taking? Authorizing Provider  ?acetaminophen (TYLENOL) 325 MG tablet Take 1-2 tablets (325-650 mg total) by mouth every 6 (six) hours as needed for mild pain or moderate pain (pain score 1-3 or temp > 100.5). 10/13/20  Yes Dagar, Geralynn RileAnjali, MD  ?amitriptyline (ELAVIL) 25 MG tablet Take 25 mg by mouth at bedtime. 11/29/18 08/31/21 Yes [provider]  ?Ascorbic Acid (VITAMIN C) 1000 MG tablet Take 1,000 mg by mouth daily.   Yes [provider]  ?aspirin EC 81 MG tablet Take 1 tablet (81 mg total) by mouth daily. ?Patient taking differently: Take 81 mg by mouth daily as needed for moderate pain. 03/06/19  Yes O'Neal, Ronnald RampWesley Thomas, MD  ?budesonide-formoterol [facility](SYMBICORT) 160-4.5 MCG/ACT inhaler Inhale 2 puffs into the lungs 2 (two) times daily. 06/03/20  Yes [provider]  ?calcium carbonate (CALCIUM 600) 1500 (600 Ca) MG TABS tablet Take 1 tablet (1,500 mg total) by mouth 2 (two) times daily with a meal. 10/13/20  Yes Dagar, Geralynn RileAnjali, MD  ?Cholecalciferol 25 MCG (1000 UT) tablet Take 1 tablet (1,000 Units total) by mouth daily. 10/13/20  Yes Dagar, Geralynn RileAnjali, MD  ?diazepam (VALIUM) 10 MG tablet Take 10 mg by mouth at bedtime as needed for anxiety or sleep. Into the vagina every night as needed for pelvic floor spasms 11/29/18  Yes [provider]  ?diphenhydrAMINE  (BENADRYL) 12.5 MG/5ML elixir Take 5-10 mLs (12.5-25 mg total) by mouth every 4 (four) hours as needed for up to 3 days for itching. 10/13/20 08/31/21 Yes Dagar, Geralynn RileAnjali, MD  ?doxycycline (VIBRAMYCIN) 100 MG capsule Take 100 mg by mouth 2 (two) times daily. 08/30/21  Yes [provider]  ?escitalopram (LEXAPRO) 20 MG tablet Take 20 mg by mouth daily.   Yes [provider]  ?fluticasone (FLONASE) 50 MCG/ACT nasal spray Place 2 sprays into the nose daily as needed for allergies. 12/04/17  Yes [provider]  ?levothyroxine (SYNTHROID) 75 MCG tablet Take 75 mcg by mouth daily. 08/13/21  Yes [provider]  ?meclizine (ANTIVERT) 25 MG tablet Take by mouth. 04/14/21  Yes [provider]  ?montelukast (SINGULAIR) 10 MG tablet Take 10 mg by mouth at bedtime.   Yes [provider]  ?Multiple Vitamin (MULTIVITAMIN) capsule Take 1 capsule by mouth daily.   Yes [provider]  ?tiZANidine (ZANAFLEX) 4 MG tablet Take 4 mg by mouth 2 (two) times daily as needed. 08/18/21  Yes [provider]  ?zinc gluconate 50 MG tablet Take 50 mg by mouth daily.   Yes [provider]  ?alendronate (FOSAMAX) 70 MG tablet Take 70 mg by mouth once a week. 08/29/21   [provider]  ?apixaban (ELIQUIS) 2.5 MG TABS tablet Take 1 tablet (2.5 mg total) by mouth 2 (two) times daily. ?Patient not taking:  Reported on 08/31/2021 10/13/20   [place], PA-C  ?HYDROcodone-acetaminophen (NORCO/VICODIN) 5-325 MG tablet Take 1-2 tablets by mouth every 6 (six) hours as needed for moderate pain or severe pain. ?Patient not taking: Reported on 08/31/2021 10/13/20   [place], PA-C  ?nicotine (NICODERM CQ - DOSED IN MG/24 HR) 7 mg/24hr patch Place 1 patch (7 mg total) onto the skin daily as needed (Upon request). ?Patient not taking: Reported on 08/31/2021 10/13/20   Dagar, Geralynn Rile, MD  ?   ? ?Allergies    ?Amoxicillin, Azithromycin, Clindamycin, Codeine, Fexofenadine,  Hydrocodone bit-homatrop mbr, and Sulfamethoxazole   ? ?Review of Systems   ?Review of Systems  ?Constitutional:  Positive for fever.  ?Respiratory:  Positive for cough and shortness of breath.   ? ?Physical Exam ?Updated Vital Signs ?BP 105/63   Pulse 83   Temp 97.9 ?F (36.6 ?C) (Oral)   Resp 17   Ht 5\' 6"  (1.676 m)   Wt 52.6 kg   SpO2 95%   BMI 18.72 kg/m?  ?Physical Exam ?Vitals and nursing note reviewed.  ?Constitutional:   ?   General: She is not in acute distress. ?   Appearance: She is well-developed.  ?HENT:  ?   Head: Normocephalic and atraumatic.  ?   Mouth/Throat:  ?   Mouth: Mucous membranes are moist.  ?Eyes:  ?   General: Vision grossly intact. Gaze aligned appropriately.  ?   Extraocular Movements: Extraocular movements intact.  ?   Conjunctiva/sclera: Conjunctivae normal.  ?Cardiovascular:  ?   Rate and Rhythm: Normal rate and regular rhythm.  ?   Pulses: Normal pulses.  ?   Heart sounds: Normal heart sounds, S1 normal and S2 normal. No murmur heard. ?  No friction rub. No gallop.  ?Pulmonary:  ?   Effort: Pulmonary effort is normal. No respiratory distress.  ?   Breath sounds: Wheezing present.  ?Abdominal:  ?   General: Bowel sounds are normal.  ?   Palpations: Abdomen is soft.  ?   Tenderness: There is no abdominal tenderness. There is no guarding or rebound.  ?   Hernia: No hernia is present.  ?Musculoskeletal:     ?   General: No swelling.  ?   Cervical back: Full passive range of motion without pain, normal range of motion and neck supple. No spinous process tenderness or muscular tenderness. Normal range of motion.  ?   Right lower leg: No edema.  ?   Left lower leg: No edema.  ?Skin: ?   General: Skin is warm and dry.  ?   Capillary Refill: Capillary refill takes less than 2 seconds.  ?   Findings: No ecchymosis, erythema, rash or wound.  ?Neurological:  ?   General: No focal deficit present.  ?   Mental Status: She is alert and oriented to person, place, and time.  ?   GCS: GCS eye  subscore is 4. GCS verbal subscore is 5. GCS motor subscore is 6.  ?   Cranial Nerves: Cranial nerves 2-12 are intact.  ?   Sensory: Sensation is intact.  ?   Motor: Motor function is intact.  ?   Coordination: Coordination is intact.  ?Psychiatric:     ?   Attention and Perception: Attention normal.     ?   Mood and Affect: Mood normal.     ?   Speech: Speech normal.     ?   Behavior: Behavior normal.  ? ? ?  ED Results / Procedures / Treatments   ?Labs ?(all labs ordered are listed, but only abnormal results are displayed) ?Labs Reviewed  ?BASIC METABOLIC PANEL - Abnormal; Notable for the following components:  ?    Result Value  ? Potassium 3.1 (*)   ? Glucose, Bld 133 (*)   ? Calcium 8.5 (*)   ? All other components within normal limits  ?RESP PANEL BY RT-PCR (FLU A&B, COVID) [id number]  ?CULTURE, BLOOD (ROUTINE X 2)  ?CULTURE, BLOOD (ROUTINE X 2)  ?CBC  ?BRAIN NATRIURETIC PEPTIDE  ?TROPONIN I (HIGH SENSITIVITY)  ?TROPONIN I (HIGH SENSITIVITY)  ? ? ?EKG ?EKG Interpretation ? ?Date/Time:  Wednesday August 31 2021 20:18:15 EDT ?Ventricular Rate:  93 ?PR Interval:  155 ?QRS Duration: 87 ?QT Interval:  347 ?QTC Calculation: 432 ?R Axis:   -62 ?Text Interpretation: Sinus rhythm Left anterior fascicular block Abnormal R-wave progression, early transition Confirmed by Gilda Crease [phone]) on 09/01/2021 12:56:32 AM ? ?Radiology ?DG Chest 2 View ? ?Result Date: 08/31/2021 ?CLINICAL DATA:  Shortness of breath, coughing and congestion. EXAM: CHEST - 2 VIEW COMPARISON:  PA Lat 10/10/2020. FINDINGS: The cardiac size is normal. There is aortic atherosclerosis and mild tortuosity with stable mediastinum. No vascular congestion is seen. There is a small left pleural effusion. There are hazy intermixed with linear opacities increased in both lung bases, which could all be due to atelectasis or could be a combination of atelectasis and pneumonitis. The remainder of the lungs are emphysematous and otherwise clear. There is  osteopenia, thoracic spondylosis and mild thoracic dextroscoliosis. Multiple overlying monitor wires. IMPRESSION: New opacities in the lung bases which could all be due to atelectasis or could be a combination of atelectasis and pn

## 2021-09-01 NOTE — H&P (Signed)
?                                                                                           ? ? ? ? Patient Demographics:  ? ? ?Melody White, is a 71 y.o. female  MRN: [id number]   DOB - 07-02-1950 ? ?Admit Date - 08/31/2021 ? ?Outpatient Primary MD for the patient is Aletha Halim., PA-C ? ? Assessment & Plan:  ? ?Assessment and Plan: ? ? ?1)Acute COPD Exacerbation-  ??? pneumonia,  treat empirically with IV Solu-Medrol 40 mg every 12 hours, give mucolytics, iv Rocephin/Doxycycline and bronchodilators as ordered, supplemental oxygen as ordered.  ? ?2)Acute hypoxic respiratory failure--- secondary to #1 above ?-Manage as above in #1 ?-Currently requiring 3 L of oxygen via nasal cannula ? ?3) tobacco abuse--- not interested in smoking cessation at this time ?-Okay to use nicotine patch while here ? ?4) depression/anxiety disorder----continue Lexapro 20 mg daily, okay to use hydroxyzine as needed ? ?5) history of prior stroke----continue aspirin, add Lipitor for secondary prevention ? ?6) hypothyroidism----TSH 2.26, continue levothyroxine 75 mcg daily ? ?7) hypokalemia--replace and recheck ? ? ?Disposition/Need for in-Hospital Stay- patient unable to be discharged at this time due to -acute hypoxic respiratory failure secondary to COPD exacerbation with possibility of a pneumonia requiring supplemental and IV antibiotics* ? ?Dispo: The patient is from: Home ?             Anticipated d/c is to: Home ?             Anticipated d/c date is: 2 days ?             Patient currently is not medically stable to d/c. ?Barriers: Not Clinically Stable-  ? ? ?With History of - ?Reviewed by me ? ?Past Medical History:  ?Diagnosis Date  ? Hypertension   ? Stroke [facility])   ? Thyroid disease   ?   ? ?Past Surgical History:  ?Procedure Laterality Date  ? ABDOMINAL HYSTERECTOMY    ? bowel obstruction    ? BREAST EXCISIONAL BIOPSY Right 20+ yrs ago  ? benign  ?  INTRAMEDULLARY (IM) NAIL INTERTROCHANTERIC Left 10/11/2020  ? Procedure: INTRAMEDULLARY (IM) NAIL INTERTROCHANTRIC;  Surgeon: Shona Needles, MD;  Location: [facility];  Service: Orthopedics;  Laterality: Left;  ? SHOULDER SURGERY    ? ? ? ? ?Chief Complaint  ?Patient presents with  ? Shortness of Breath  ?  ? ? HPI:  ? ? Melody White  is a 71 y.o. female with ongoing tobacco use and past medical history relevant for COPD, hypothyroidism, history of prior stroke, depression with anxiety who presents to the ED with cough, shortness of breath and increased work of breathing and found to be hypoxic in the ED ?-Additional history obtained from patient's niece Baldo Ash at bedside ?-Apparently patient has been sick for over a couple weeks now with respiratory symptoms ?-She had a virtual visit with PCP about a week ago so she was started on doxycycline but had respiratory symptoms have worsened since then ?-No frank chest pains patient does have dyspnea at rest and with activity ?-  Cough is at times productive ?-Patient denies high fevers ?-She went to the urgent care yesterday 08/31/2021 for evaluation and was sent to the ED due to significant respiratory symptoms and hypoxia ?-In the ED patient initially required 6 L of oxygen via nasal cannula, she is being weaned down to 3 to 4 L of oxygen at the time of my evaluation ?-Chest x-ray with possible pneumonia ?--COVID and flu negative ?-Troponin is not elevated ?-BNP is 85--WNL ?-Potassium is low at 3.1, creatinine 0.88 ?-CBC WNL ? ? Review of systems:  ?  ?In addition to the HPI above,  ? ?A full Review of  Systems was done, all other systems reviewed are negative except as noted above in HPI , . ? ? ? Social History:  ?Reviewed by me ? ?  ?Social History  ? ?Tobacco Use  ? Smoking status: Every Day  ?  Packs/day: 0.50  ?  Years: 50.00  ?  Pack years: 25.00  ?  Types: Cigarettes  ? Smokeless tobacco: Never  ?Substance Use Topics  ? Alcohol use: Not Currently  ? ? ? ? ? Family  History :  ?Reviewed by me ? ?  ?Family History  ?Problem Relation Age of Onset  ? Arthritis Mother   ? ? ? Home Medications:  ? ?Prior to Admission medications   ?Medication Sig Start Date End Date Taking? Authorizing Provider  ?acetaminophen (TYLENOL) 325 MG tablet Take 1-2 tablets (325-650 mg total) by mouth every 6 (six) hours as needed for mild pain or moderate pain (pain score 1-3 or temp > 100.5). 10/13/20  Yes Dagar, Meredith Staggers, MD  ?amitriptyline (ELAVIL) 25 MG tablet Take 25 mg by mouth at bedtime. 11/29/18 08/31/21 Yes [provider]  ?Ascorbic Acid (VITAMIN C) 1000 MG tablet Take 1,000 mg by mouth daily.   Yes [provider]  ?aspirin EC 81 MG tablet Take 1 tablet (81 mg total) by mouth daily. ?Patient taking differently: Take 81 mg by mouth daily as needed for moderate pain. 03/06/19  Yes O'Neal, Cassie Freer, MD  ?budesonide-formoterol [facility]) 160-4.5 MCG/ACT inhaler Inhale 2 puffs into the lungs 2 (two) times daily. 06/03/20  Yes [provider]  ?calcium carbonate (CALCIUM 600) 1500 (600 Ca) MG TABS tablet Take 1 tablet (1,500 mg total) by mouth 2 (two) times daily with a meal. 10/13/20  Yes Dagar, Meredith Staggers, MD  ?Cholecalciferol 25 MCG (1000 UT) tablet Take 1 tablet (1,000 Units total) by mouth daily. 10/13/20  Yes Dagar, Meredith Staggers, MD  ?diazepam (VALIUM) 10 MG tablet Take 10 mg by mouth at bedtime as needed for anxiety or sleep. Into the vagina every night as needed for pelvic floor spasms 11/29/18  Yes [provider]  ?diphenhydrAMINE (BENADRYL) 12.5 MG/5ML elixir Take 5-10 mLs (12.5-25 mg total) by mouth every 4 (four) hours as needed for up to 3 days for itching. 10/13/20 08/31/21 Yes Dagar, Meredith Staggers, MD  ?doxycycline (VIBRAMYCIN) 100 MG capsule Take 100 mg by mouth 2 (two) times daily. 08/30/21  Yes [provider]  ?escitalopram (LEXAPRO) 20 MG tablet Take 20 mg by mouth daily.   Yes [provider]  ?fluticasone (FLONASE) 50 MCG/ACT nasal spray Place 2  sprays into the nose daily as needed for allergies. 12/04/17  Yes [provider]  ?levothyroxine (SYNTHROID) 75 MCG tablet Take 75 mcg by mouth daily. 08/13/21  Yes [provider]  ?meclizine (ANTIVERT) 25 MG tablet Take by mouth. 04/14/21  Yes [provider]  ?montelukast (SINGULAIR) 10 MG  tablet Take 10 mg by mouth at bedtime.   Yes [provider]  ?Multiple Vitamin (MULTIVITAMIN) capsule Take 1 capsule by mouth daily.   Yes [provider]  ?tiZANidine (ZANAFLEX) 4 MG tablet Take 4 mg by mouth 2 (two) times daily as needed. 08/18/21  Yes [provider]  ?zinc gluconate 50 MG tablet Take 50 mg by mouth daily.   Yes [provider]  ?alendronate (FOSAMAX) 70 MG tablet Take 70 mg by mouth once a week. 08/29/21   [provider]  ?apixaban (ELIQUIS) 2.5 MG TABS tablet Take 1 tablet (2.5 mg total) by mouth 2 (two) times daily. ?Patient not taking: Reported on 08/31/2021 10/13/20   Corinne Ports, PA-C  ?HYDROcodone-acetaminophen (NORCO/VICODIN) 5-325 MG tablet Take 1-2 tablets by mouth every 6 (six) hours as needed for moderate pain or severe pain. ?Patient not taking: Reported on 08/31/2021 10/13/20   Corinne Ports, PA-C  ?nicotine (NICODERM CQ - DOSED IN MG/24 HR) 7 mg/24hr patch Place 1 patch (7 mg total) onto the skin daily as needed (Upon request). ?Patient not taking: Reported on 08/31/2021 10/13/20   Dagar, Meredith Staggers, MD  ? ? ? Allergies:  ? ?  ?Allergies  ?Allergen Reactions  ? Amoxicillin Other (See Comments)  ?  other ?  ? Azithromycin Itching  ? Clindamycin Other (See Comments)  ?  Funny feeling ?other ?  ? Codeine Itching and Other (See Comments)  ?  other ?  ? Fexofenadine Other (See Comments)  ?  Funny feeling ?other ?  ? Hydrocodone Bit-Homatrop Mbr Itching  ? Sulfamethoxazole Nausea Only and Other (See Comments)  ?  other ?  ? ? ? Physical Exam:  ? ?Vitals ? ?Blood pressure 121/73, pulse 84, temperature (!) 97.5 ?F (36.4 ?C), temperature  source Oral, resp. rate 20, height 5\' 6"  (1.676 m), weight 50.8 kg, SpO2 92 %. ? ?Physical Examination: General appearance - alert, somewhat increased work of breathing ?Mental status - alert, oriented to pers

## 2021-09-02 DIAGNOSIS — Z79899 Other long term (current) drug therapy: Secondary | ICD-10-CM | POA: Diagnosis not present

## 2021-09-02 DIAGNOSIS — Z7982 Long term (current) use of aspirin: Secondary | ICD-10-CM | POA: Diagnosis not present

## 2021-09-02 DIAGNOSIS — Z90710 Acquired absence of both cervix and uterus: Secondary | ICD-10-CM | POA: Diagnosis not present

## 2021-09-02 DIAGNOSIS — R0602 Shortness of breath: Secondary | ICD-10-CM | POA: Diagnosis present

## 2021-09-02 DIAGNOSIS — E876 Hypokalemia: Secondary | ICD-10-CM | POA: Diagnosis present

## 2021-09-02 DIAGNOSIS — Z7951 Long term (current) use of inhaled steroids: Secondary | ICD-10-CM | POA: Diagnosis not present

## 2021-09-02 DIAGNOSIS — Z7983 Long term (current) use of bisphosphonates: Secondary | ICD-10-CM | POA: Diagnosis not present

## 2021-09-02 DIAGNOSIS — F418 Other specified anxiety disorders: Secondary | ICD-10-CM | POA: Diagnosis present

## 2021-09-02 DIAGNOSIS — F17210 Nicotine dependence, cigarettes, uncomplicated: Secondary | ICD-10-CM | POA: Diagnosis present

## 2021-09-02 DIAGNOSIS — Z8673 Personal history of transient ischemic attack (TIA), and cerebral infarction without residual deficits: Secondary | ICD-10-CM | POA: Diagnosis not present

## 2021-09-02 DIAGNOSIS — I10 Essential (primary) hypertension: Secondary | ICD-10-CM | POA: Diagnosis present

## 2021-09-02 DIAGNOSIS — J441 Chronic obstructive pulmonary disease with (acute) exacerbation: Secondary | ICD-10-CM | POA: Diagnosis present

## 2021-09-02 DIAGNOSIS — J189 Pneumonia, unspecified organism: Secondary | ICD-10-CM | POA: Diagnosis present

## 2021-09-02 DIAGNOSIS — E039 Hypothyroidism, unspecified: Secondary | ICD-10-CM | POA: Diagnosis present

## 2021-09-02 DIAGNOSIS — Z79890 Hormone replacement therapy (postmenopausal): Secondary | ICD-10-CM | POA: Diagnosis not present

## 2021-09-02 DIAGNOSIS — J9601 Acute respiratory failure with hypoxia: Secondary | ICD-10-CM | POA: Diagnosis present

## 2021-09-02 DIAGNOSIS — Z20822 Contact with and (suspected) exposure to covid-19: Secondary | ICD-10-CM | POA: Diagnosis present

## 2021-09-02 DIAGNOSIS — J440 Chronic obstructive pulmonary disease with acute lower respiratory infection: Secondary | ICD-10-CM | POA: Diagnosis present

## 2021-09-02 LAB — BASIC METABOLIC PANEL
Anion gap: 8 (ref 5–15)
BUN: 26 mg/dL — ABNORMAL HIGH (ref 8–23)
CO2: 28 mmol/L (ref 22–32)
Calcium: 9.2 mg/dL (ref 8.9–10.3)
Chloride: 102 mmol/L (ref 98–111)
Creatinine, Ser: 0.55 mg/dL (ref 0.44–1.00)
GFR, Estimated: >60 mL/min (ref >60)
Glucose, Bld: 124 mg/dL — ABNORMAL HIGH (ref 70–99)
Potassium: 4.4 mmol/L (ref 3.5–5.1)
Sodium: 138 mmol/L (ref 135–145)

## 2021-09-02 LAB — CBC
HCT: 41.8 % (ref 36.0–46.0)
Hemoglobin: 13.3 g/dL (ref 12.0–15.0)
MCH: 30.5 pg (ref 26.0–34.0)
MCHC: 31.8 g/dL (ref 30.0–36.0)
MCV: 95.9 fL (ref 80.0–100.0)
Platelets: 256 10*3/uL (ref 150–400)
RBC: 4.36 MIL/uL (ref 3.87–5.11)
RDW: 14.6 % (ref 11.5–15.5)
WBC: 12.9 10*3/uL — ABNORMAL HIGH (ref 4.0–10.5)
nRBC: 0 % (ref 0.0–0.2)

## 2021-09-02 NOTE — Progress Notes (Signed)
Patient has rested well this shift.  No new issues noted.  ?

## 2021-09-02 NOTE — Plan of Care (Signed)
  Problem: Education: Goal: Knowledge of General Education information will improve Description: Including pain rating scale, medication(s)/side effects and non-pharmacologic comfort measures Outcome: Progressing   Problem: Clinical Measurements: Goal: Ability to maintain clinical measurements within normal limits will improve Outcome: Progressing Goal: Will remain free from infection Outcome: Progressing   

## 2021-09-02 NOTE — Progress Notes (Signed)
?PROGRESS NOTE ? ? ? ? ?Melody White, is a 71 y.o. female, DOB - January 16, 1951, PYP:[id number] ? ?Admit date - 08/31/2021   Admitting Physician Quanesha Klimaszewski Mariea Clonts, MD ? ?Outpatient Primary MD for the patient is Richmond Campbell., PA-C ? ?LOS - 0 ? ?Chief Complaint  ?Patient presents with  ? Shortness of Breath  ?    ? ?Brief Narrative:  ?71 y.o. female with ongoing tobacco use and past medical history relevant for COPD, hypothyroidism, history of prior stroke, depression with anxiety admitted on 09/01/2021 with acute hypoxic respiratory failure secondary to COPD exacerbation ? ?  ?-Assessment and Plan: ?1)Acute COPD Exacerbation-  ??? pneumonia, ?-Continue IV Solu-Medrol 40 mg every 12 hours, give mucolytics, ?-Continue iv Rocephin/Doxycycline and bronchodilators as ordered, supplemental oxygen as ordered.  ?  ?2)Acute hypoxic respiratory failure--- secondary to #1 above ?-Manage as above in #1 ?-Initially required 4 to 5 L of oxygen currently requiring 3 L of oxygen at rest ?  ?3) tobacco abuse--- not interested in smoking cessation at this time ?-Okay to use nicotine patch while here ?  ?4) depression/anxiety disorder----continue Lexapro 20 mg daily,  ?-c/n  hydroxyzine as needed ?  ?5) history of prior stroke----continue aspirin, add Lipitor for secondary prevention ?  ?6) hypothyroidism----TSH 2.26, continue levothyroxine 75 mcg daily ?  ?7) hypokalemia--potassium is up to 4.4 from 3.1 after replacement ?  ?  ?Disposition/Need for in-Hospital Stay- patient unable to be discharged at this time due to -acute hypoxic respiratory failure secondary to COPD exacerbation with possibility of a pneumonia requiring supplemental and IV antibiotics* ?  ?Dispo: The patient is from: Home ?             Anticipated d/c is to: Home ?             Anticipated d/c date is: 1 days ?             Patient currently is not medically stable to d/c. ?Barriers: Not Clinically Stable-  ?  ?  Code Status: Full Code  ? ?Family Communication:  (patient  is alert, awake and coherent) -patient's husband visited ? ?DVT Prophylaxis  :   - SCDs   heparin injection 5,000 Units Start: 09/01/21 1400 ?SCDs Start: 09/01/21 0744 ?Place TED hose Start: 09/01/21 0744 ? ? ?Lab Results  ?Component Value Date  ? PLT 256 09/02/2021  ? ? ?Inpatient Medications ? ?Scheduled Meds: ? aspirin EC  81 mg Oral Daily  ? atorvastatin  20 mg Oral Daily  ? dextromethorphan-guaiFENesin  1 tablet Oral BID  ? doxycycline  100 mg Oral BID  ? escitalopram  20 mg Oral Daily  ? heparin  5,000 Units Subcutaneous Q8H  ? ipratropium-albuterol  3 mL Nebulization TID  ? levothyroxine  75 mcg Oral Daily  ? methylPREDNISolone (SOLU-MEDROL) injection  40 mg Intravenous Q12H  ? nicotine  21 mg Transdermal Daily  ? sodium chloride flush  3 mL Intravenous Q12H  ? sodium chloride flush  3 mL Intravenous Q12H  ? ?Continuous Infusions: ? sodium chloride    ? cefTRIAXone (ROCEPHIN)  IV Stopped (09/02/21 1013)  ? ?PRN Meds:.sodium chloride, acetaminophen **OR** acetaminophen, albuterol, bisacodyl, hydrOXYzine, ondansetron **OR** ondansetron (ZOFRAN) IV, polyethylene glycol, sodium chloride flush, traZODone ? ? ?Anti-infectives (From admission, onward)  ? ? Start     Dose/Rate Route Frequency Ordered Stop  ? 09/02/21 1000  cefTRIAXone (ROCEPHIN) 2 g in sodium chloride 0.9 % 100 mL IVPB       ? 2  g ?200 mL/hr over 30 Minutes Intravenous Daily 09/01/21 2252    ? 09/02/21 0100  cefTRIAXone (ROCEPHIN) 1 g in sodium chloride 0.9 % 100 mL IVPB  Status:  Discontinued       ? 1 g ?200 mL/hr over 30 Minutes Intravenous Every 24 hours 09/01/21 0745 09/01/21 2252  ? 09/01/21 1000  doxycycline (VIBRA-TABS) tablet 100 mg       ? 100 mg Oral 2 times daily 09/01/21 0745    ? 09/01/21 0115  cefTRIAXone (ROCEPHIN) 1 g in sodium chloride 0.9 % 100 mL IVPB       ? 1 g ?200 mL/hr over 30 Minutes Intravenous  Once 09/01/21 0102 09/01/21 0201  ? 09/01/21 0115  doxycycline (VIBRA-TABS) tablet 100 mg       ? 100 mg Oral  Once 09/01/21 0102  09/01/21 0131  ? ?  ? ?  ? ?Subjective: ?Tally DueLinda Altemose today has no fevers, no emesis,  No chest pain,   ?Productive cough improving ?-Shortness of breath persist ?-Wheezing persist ? ? ?Objective: ?Vitals:  ? 09/02/21 0438 09/02/21 0940 09/02/21 1300 09/02/21 1433  ?BP: 125/73  124/72   ?Pulse: 88  90   ?Resp: 16  16   ?Temp: 98 ?F (36.7 ?C)  98.2 ?F (36.8 ?C)   ?TempSrc:   Oral   ?SpO2: 97% 90% 94% 90%  ?Weight:      ?Height:      ? ? ?Intake/Output Summary (Last 24 hours) at 09/02/2021 1957 ?Last data filed at 09/02/2021 1700 ?Gross per 24 hour  ?Intake 1057.69 ml  ?Output 800 ml  ?Net 257.69 ml  ? ?Filed Weights  ? 08/31/21 2001 09/01/21 1623  ?Weight: 52.6 kg 50.8 kg  ? ? ?Physical Exam ? ?Gen:- Awake Alert, no conversational dyspnea continues to have dyspnea on exertion ?HEENT:- [place].AT, No sclera icterus ?Nose- [place] 3L/min ?Neck-Supple Neck,No JVD,.  ?Lungs-air movement is not great, scattered wheezes persist  ?CV- S1, S2 normal, regular  ?Abd-  +ve B.Sounds, Abd Soft, No tenderness,    ?Extremity/Skin:- No  edema, pedal pulses present  ?Psych-affect is appropriate, oriented x3 ?Neuro-no new focal deficits, no tremors ? ?Data Reviewed: I have personally reviewed following labs and imaging studies ? ?CBC: ?Recent Labs  ?Lab 08/31/21 ?2025 09/02/21 ?[id number]  ?WBC 8.0 12.9*  ?HGB 13.9 13.3  ?HCT 44.0 41.8  ?MCV 95.4 95.9  ?PLT 226 256  ? ?Basic Metabolic Panel: ?Recent Labs  ?Lab 08/31/21 ?2025 09/02/21 ?[id number]  ?NA 139 138  ?K 3.1* 4.4  ?CL 104 102  ?CO2 26 28  ?GLUCOSE 133* 124*  ?BUN 18 26*  ?CREATININE 0.88 0.55  ?CALCIUM 8.5* 9.2  ? ?GFR: ?Estimated Creatinine Clearance: 53.2 mL/min (by C-G formula based on SCr of 0.55 mg/dL). ?Liver Function Tests: ?No results for input(s): AST, ALT, ALKPHOS, BILITOT, PROT, ALBUMIN in the last 168 hours. ?Cardiac Enzymes: ?No results for input(s): CKTOTAL, CKMB, CKMBINDEX, TROPONINI in the last 168 hours. ?BNP (last 3 results) ?No results for input(s): PROBNP in the last 8760  hours. ?HbA1C: ?No results for input(s): HGBA1C in the last 72 hours. ?Sepsis Labs: ?@LABRCNTIP (procalcitonin:4,lacticidven:4) ?) ?Recent Results (from the past 240 hour(s))  ?Resp Panel by RT-PCR (Flu A&B, Covid) Nasopharyngeal Swab     Status: None  ? Collection Time: 09/01/21  1:01 AM  ? Specimen: Nasopharyngeal Swab; Nasopharyngeal(NP) swabs in vial transport medium  ?Result Value Ref Range Status  ? SARS Coronavirus 2 by RT PCR NEGATIVE NEGATIVE Final  ?  Comment: (NOTE) ?SARS-CoV-2 target nucleic acids are NOT DETECTED. ? ?The SARS-CoV-2 RNA is generally detectable in upper respiratory ?specimens during the acute phase of infection. The lowest ?concentration of SARS-CoV-2 viral copies this assay can detect is ?138 copies/mL. A negative result does not preclude SARS-Cov-2 ?infection and should not be used as the sole basis for treatment or ?other patient management decisions. A negative result may occur with  ?improper specimen collection/handling, submission of specimen other ?than nasopharyngeal swab, presence of viral mutation(s) within the ?areas targeted by this assay, and inadequate number of viral ?copies(<138 copies/mL). A negative result must be combined with ?clinical observations, patient history, and epidemiological ?information. The expected result is Negative. ? ?Fact Sheet for Patients:  ?BloggerCourse.com ? ?Fact Sheet for Healthcare Providers:  ?SeriousBroker.it ? ?This test is no t yet approved or cleared by the [place] FDA and  ?has been authorized for detection and/or diagnosis of SARS-CoV-2 by ?FDA under an Emergency Use Authorization (EUA). This EUA will remain  ?in effect (meaning this test can be used) for the duration of the ?COVID-19 declaration under Section 564(b)(1) of the Act, 21 ?U.S.C.section 360bbb-3(b)(1), unless the authorization is terminated  ?or revoked sooner.  ? ? ?  ? Influenza A by PCR NEGATIVE NEGATIVE Final  ?  Influenza B by PCR NEGATIVE NEGATIVE Final  ?  Comment: (NOTE) ?The Xpert Xpress SARS-CoV-2/FLU/RSV plus assay is intended as an aid ?in the diagnosis of influenza from Nasopharyngeal swab specimens and ?should

## 2021-09-02 NOTE — Care Management Obs Status (Signed)
MEDICARE OBSERVATION STATUS NOTIFICATION ? ? ?Patient Details  ?Name: Melody White ?MRN: [id number] ?Date of Birth: 02/11/1951 ? ? ?Medicare Observation Status Notification Given:  Yes ? ? ? ?Corey Harold ?09/02/2021, 11:38 AM ?

## 2021-09-02 NOTE — TOC Progression Note (Signed)
?  Transition of Care (TOC) Screening Note ? ? ?Patient Details  ?Name: Melody White ?Date of Birth: 10-10-1950 ? ? ?Transition of Care (TOC) CM/SW Contact:    ?Elliot Gault, LCSW ?Phone Number: ?09/02/2021, 11:47 AM ? ? ? ?MD indicating pt may need Home O2 at dc. Weekend TOC will follow and assist if orders placed. ? ?Transition of Care Department [facility]) has reviewed patient and no TOC needs have been identified at this time. We will continue to monitor patient advancement through interdisciplinary progression rounds. If new patient transition needs arise, please place a TOC consult. ? ? ?

## 2021-09-03 DIAGNOSIS — J441 Chronic obstructive pulmonary disease with (acute) exacerbation: Secondary | ICD-10-CM | POA: Diagnosis not present

## 2021-09-03 NOTE — ED Provider Notes (Signed)
?[facility] ? ? ?[id number] ?08/31/21 Arrival Time: 1735 ? ?ASSESSMENT & PLAN: ? ?1. Hypoxemia   ?2. COPD exacerbation (HCC)   ? ? ?BP 121/66 (BP Location: Right Arm)   Pulse 92   Temp 98.1 ?F (36.7 ?C) (Oral)   Resp (!) 22   SpO2 (!) 87% Comment: 82-84% on room air. ? ?New O2 requirement. Has been wheezing for a few days; SOB at times. No CP. No significant distress here. Family member will drive her to ED; declines EMS. Stable upon discharge. ? ? ? Follow-up Information   ? ? Go to  [facility] EMERGENCY DEPARTMENT.   ?Specialty: Emergency Medicine ?Contact information: ?[address] ?[id number] mc ?[place] [address] ?[phone] ? ?  ?  ? ?  ?  ? ?  ? ? ?Reviewed expectations re: course of current medical issues. Questions answered. ?Outlined signs and symptoms indicating need for more acute intervention. ?Patient verbalized understanding. ?After Visit Summary given. ? ? ?SUBJECTIVE: ?History from: patient. ?Melody White is a 71 y.o. female who presents with complaint of wheezing; SOB; sporadic; mainly over past week, Afebrile. ?Has dx of COPD. ? ? ?OBJECTIVE: ? ?Vitals:  ? 08/31/21 1932  ?BP: 121/66  ?Pulse: 92  ?Resp: (!) 22  ?Temp: 98.1 ?F (36.7 ?C)  ?TempSrc: Oral  ?SpO2: (!) 87%  ?  ?General appearance: alert; no distress ?Eyes: PERRLA; EOMI; conjunctiva normal ?HENT: normocephalic; atraumatic ?Neck: supple  ?Lungs: bilateral wheezing; with labored breathing ?Heart: regular rate and rhythm without murmer ?Abdomen: soft, non-tender ?Extremities: no edema; symmetrical with no gross deformities ?Skin: warm and dry ?Neurologic: normal gait; normal symmetric reflexes ?Psychological: alert and cooperative; normal mood and affect ? ?Allergies  ?Allergen Reactions  ? Amoxicillin Other (See Comments)  ?  other ?  ? Azithromycin Itching  ? Clindamycin Other (See Comments)  ?  Funny feeling ?other ?  ? Codeine Itching and Other (See Comments)  ?  other ?  ? Fexofenadine Other (See  Comments)  ?  Funny feeling ?other ?  ? Hydrocodone Bit-Homatrop Mbr Itching  ? Sulfamethoxazole Nausea Only and Other (See Comments)  ?  other ?  ? ? ?Past Medical History:  ?Diagnosis Date  ? Hypertension   ? Stroke [facility])   ? Thyroid disease   ? ?Social History  ? ?Socioeconomic History  ? Marital status: Married  ?  Spouse name: Not on file  ? Number of children: 0  ? Years of education: Not on file  ? Highest education level: Not on file  ?Occupational History  ? Occupation: retired   ?Tobacco Use  ? Smoking status: Every Day  ?  Packs/day: 0.50  ?  Years: 50.00  ?  Pack years: 25.00  ?  Types: Cigarettes  ? Smokeless tobacco: Never  ?Substance and Sexual Activity  ? Alcohol use: Not Currently  ? Drug use: Not Currently  ? Sexual activity: Not on file  ?Other Topics Concern  ? Not on file  ?Social History Narrative  ? Not on file  ? ?Social Determinants of Health  ? ?Financial Resource Strain: Not on file  ?Food Insecurity: Not on file  ?Transportation Needs: Not on file  ?Physical Activity: Not on file  ?Stress: Not on file  ?Social Connections: Not on file  ?Intimate Partner Violence: Not on file  ? ?Family History  ?Problem Relation Age of Onset  ? Arthritis Mother   ? ?Past Surgical History:  ?Procedure Laterality Date  ? ABDOMINAL HYSTERECTOMY    ?  bowel obstruction    ? BREAST EXCISIONAL BIOPSY Right 20+ yrs ago  ? benign  ? INTRAMEDULLARY (IM) NAIL INTERTROCHANTERIC Left 10/11/2020  ? Procedure: INTRAMEDULLARY (IM) NAIL INTERTROCHANTRIC;  Surgeon: Roby Lofts, MD;  Location: [facility] OR;  Service: Orthopedics;  Laterality: Left;  ? SHOULDER SURGERY    ? ?  ?Mardella Layman, MD ?09/03/21 1042 ? ?

## 2021-09-03 NOTE — Plan of Care (Signed)
?  Problem: Clinical Measurements: ?Goal: Ability to maintain clinical measurements within normal limits will improve ?Outcome: Progressing ?Goal: Will remain free from infection ?Outcome: Progressing ?Goal: Diagnostic test results will improve ?Outcome: Progressing ?  ?

## 2021-09-03 NOTE — Progress Notes (Signed)
?PROGRESS NOTE ? ? ? ? ?Melody White, is a 71 y.o. female, DOB - 06-04-1950, [id number] ? ?Admit date - 08/31/2021   Admitting Physician Savannah Erbe Mariea Clonts, MD ? ?Outpatient Primary MD for the patient is Richmond Campbell., PA-C ? ?LOS - 1 ? ?Chief Complaint  ?Patient presents with  ? Shortness of Breath  ?    ? ?Brief Narrative:  ?71 y.o. female with ongoing tobacco use and past medical history relevant for COPD, hypothyroidism, history of prior stroke, depression with anxiety admitted on 09/01/2021 with acute hypoxic respiratory failure secondary to COPD exacerbation ? ?  ?-Assessment and Plan: ?1)Acute COPD Exacerbation-  ??? pneumonia, ?-Continue IV Solu-Medrol 40 mg every 12 hours, give mucolytics, ?-Remains symptomatic with cough and dyspnea on exertion and hypoxia ?--Slow improvement ?-We will recheck chest x-ray on 09/04/2021 ?-Continue iv Rocephin/Doxycycline and bronchodilators as ordered, supplemental oxygen as ordered.  ?  ?2)Acute hypoxic respiratory failure--- secondary to #1 above ?-Manage as above in #1 ?-Initially required 4 to 5 L of oxygen currently requiring 3 L of oxygen at rest ?  ?3) tobacco abuse--- not interested in smoking cessation at this time ?Continue nicotine patch while here ?  ?4) depression/anxiety disorder----continue Lexapro 20 mg daily,  ?-c/n  hydroxyzine as needed ?  ?5) history of prior stroke----continue aspirin, add Lipitor for secondary prevention ?  ?6) hypothyroidism----TSH 2.26, continue levothyroxine 75 mcg daily ?  ?7) hypokalemia--potassium is up to 4.4 from 3.1 after replacement ?  ?  ?Disposition/Need for in-Hospital Stay- patient unable to be discharged at this time due to -acute hypoxic respiratory failure secondary to COPD exacerbation with possibility of a pneumonia requiring supplemental and IV antibiotics* ?-Repeat chest x-ray on 09/04/2021 possible discharge home 2 days if continues to improve ?-May need home O2 ?  ?Dispo: The patient is from: Home ?              Anticipated d/c is to: Home ?             Anticipated d/c date is: 1 days ?             Patient currently is not medically stable to d/c. ?Barriers: Not Clinically Stable-  ?  ?  Code Status: Full Code  ? ?Family Communication:  (patient is alert, awake and coherent) -patient's husband visited ? ?DVT Prophylaxis  :   - SCDs   heparin injection 5,000 Units Start: 09/01/21 1400 ?SCDs Start: 09/01/21 0744 ?Place TED hose Start: 09/01/21 0744 ? ? ?Lab Results  ?Component Value Date  ? PLT 256 09/02/2021  ? ? ?Inpatient Medications ? ?Scheduled Meds: ? aspirin EC  81 mg Oral Daily  ? atorvastatin  20 mg Oral Daily  ? dextromethorphan-guaiFENesin  1 tablet Oral BID  ? doxycycline  100 mg Oral BID  ? escitalopram  20 mg Oral Daily  ? heparin  5,000 Units Subcutaneous Q8H  ? ipratropium-albuterol  3 mL Nebulization TID  ? levothyroxine  75 mcg Oral Daily  ? methylPREDNISolone (SOLU-MEDROL) injection  40 mg Intravenous Q12H  ? nicotine  21 mg Transdermal Daily  ? sodium chloride flush  3 mL Intravenous Q12H  ? sodium chloride flush  3 mL Intravenous Q12H  ? ?Continuous Infusions: ? sodium chloride    ? cefTRIAXone (ROCEPHIN)  IV 2 g (09/03/21 1036)  ? ?PRN Meds:.sodium chloride, acetaminophen **OR** acetaminophen, albuterol, bisacodyl, hydrOXYzine, ondansetron **OR** ondansetron (ZOFRAN) IV, polyethylene glycol, sodium chloride flush, traZODone ? ? ?Anti-infectives (From admission, onward)  ? ?  Start     Dose/Rate Route Frequency Ordered Stop  ? 09/02/21 1000  cefTRIAXone (ROCEPHIN) 2 g in sodium chloride 0.9 % 100 mL IVPB       ? 2 g ?200 mL/hr over 30 Minutes Intravenous Daily 09/01/21 2252    ? 09/02/21 0100  cefTRIAXone (ROCEPHIN) 1 g in sodium chloride 0.9 % 100 mL IVPB  Status:  Discontinued       ? 1 g ?200 mL/hr over 30 Minutes Intravenous Every 24 hours 09/01/21 0745 09/01/21 2252  ? 09/01/21 1000  doxycycline (VIBRA-TABS) tablet 100 mg       ? 100 mg Oral 2 times daily 09/01/21 0745    ? 09/01/21 0115  cefTRIAXone  (ROCEPHIN) 1 g in sodium chloride 0.9 % 100 mL IVPB       ? 1 g ?200 mL/hr over 30 Minutes Intravenous  Once 09/01/21 0102 09/01/21 0201  ? 09/01/21 0115  doxycycline (VIBRA-TABS) tablet 100 mg       ? 100 mg Oral  Once 09/01/21 0102 09/01/21 0131  ? ?  ? ?  ?Subjective: ?Melody White today has no fevers, no emesis,  No chest pain,   ?=-Cough is at times productive ?-Dyspnea on exertion persist- ? unsuccessful attempt to wean off oxygen currently requiring 3 L of oxygen via nasal cannula ? ? ?Objective: ?Vitals:  ? 09/03/21 0602 09/03/21 0820 09/03/21 1345 09/03/21 1403  ?BP: (!) 156/93  132/83   ?Pulse: 80  94   ?Resp: 19  18   ?Temp: 98.6 ?F (37 ?C)  97.6 ?F (36.4 ?C)   ?TempSrc:   Oral   ?SpO2: 96% 94% 92% 92%  ?Weight:      ?Height:      ? ? ?Intake/Output Summary (Last 24 hours) at 09/03/2021 1801 ?Last data filed at 09/03/2021 0900 ?Gross per 24 hour  ?Intake 480 ml  ?Output --  ?Net 480 ml  ? ?Filed Weights  ? 08/31/21 2001 09/01/21 1623  ?Weight: 52.6 kg 50.8 kg  ? ? ?Physical Exam ? ?Gen:- Awake Alert, no conversational dyspnea continues to have dyspnea on exertion ?HEENT:- [place].AT, No sclera icterus ?Nose- [place] 3L/min ?Neck-Supple Neck,No JVD,.  ?Lungs-air movement is fair, few wheezes  ?CV- S1, S2 normal, regular  ?Abd-  +ve B.Sounds, Abd Soft, No tenderness,    ?Extremity/Skin:- No  edema, pedal pulses present  ?Psych-affect is appropriate, oriented x3 ?Neuro-no new focal deficits, no tremors ? ?Data Reviewed: I have personally reviewed following labs and imaging studies ? ?CBC: ?Recent Labs  ?Lab 08/31/21 ?2025 09/02/21 ?[id number]  ?WBC 8.0 12.9*  ?HGB 13.9 13.3  ?HCT 44.0 41.8  ?MCV 95.4 95.9  ?PLT 226 256  ? ?Basic Metabolic Panel: ?Recent Labs  ?Lab 08/31/21 ?2025 09/02/21 [id number]  ?NA 139 138  ?K 3.1* 4.4  ?CL 104 102  ?CO2 26 28  ?GLUCOSE 133* 124*  ?BUN 18 26*  ?CREATININE 0.88 0.55  ?CALCIUM 8.5* 9.2  ? ?GFR: ?Estimated Creatinine Clearance: 53.2 mL/min (by C-G formula based on SCr of 0.55 mg/dL). ?Liver  Function Tests: ?No results for input(s): AST, ALT, ALKPHOS, BILITOT, PROT, ALBUMIN in the last 168 hours. ?Cardiac Enzymes: ?No results for input(s): CKTOTAL, CKMB, CKMBINDEX, TROPONINI in the last 168 hours. ?BNP (last 3 results) ?No results for input(s): PROBNP in the last 8760 hours. ?HbA1C: ?No results for input(s): HGBA1C in the last 72 hours. ?Sepsis Labs: ?@LABRCNTIP (procalcitonin:4,lacticidven:4) ?) ?Recent Results (from the past 240 hour(s))  ?Resp Panel by RT-PCR (Flu A&B,  Covid) Nasopharyngeal Swab     Status: None  ? Collection Time: 09/01/21  1:01 AM  ? Specimen: Nasopharyngeal Swab; Nasopharyngeal(NP) swabs in vial transport medium  ?Result Value Ref Range Status  ? SARS Coronavirus 2 by RT PCR NEGATIVE NEGATIVE Final  ?  Comment: (NOTE) ?SARS-CoV-2 target nucleic acids are NOT DETECTED. ? ?The SARS-CoV-2 RNA is generally detectable in upper respiratory ?specimens during the acute phase of infection. The lowest ?concentration of SARS-CoV-2 viral copies this assay can detect is ?138 copies/mL. A negative result does not preclude SARS-Cov-2 ?infection and should not be used as the sole basis for treatment or ?other patient management decisions. A negative result may occur with  ?improper specimen collection/handling, submission of specimen other ?than nasopharyngeal swab, presence of viral mutation(s) within the ?areas targeted by this assay, and inadequate number of viral ?copies(<138 copies/mL). A negative result must be combined with ?clinical observations, patient history, and epidemiological ?information. The expected result is Negative. ? ?Fact Sheet for Patients:  ?BloggerCourse.com ? ?Fact Sheet for Healthcare Providers:  ?SeriousBroker.it ? ?This test is no t yet approved or cleared by the [place] FDA and  ?has been authorized for detection and/or diagnosis of SARS-CoV-2 by ?FDA under an Emergency Use Authorization (EUA). This EUA will  remain  ?in effect (meaning this test can be used) for the duration of the ?COVID-19 declaration under Section 564(b)(1) of the Act, 21 ?U.S.C.section 360bbb-3(b)(1), unless the authorization is terminated  ?or

## 2021-09-04 ENCOUNTER — Inpatient Hospital Stay (HOSPITAL_COMMUNITY): Payer: Medicare Other

## 2021-09-04 DIAGNOSIS — J441 Chronic obstructive pulmonary disease with (acute) exacerbation: Secondary | ICD-10-CM | POA: Diagnosis not present

## 2021-09-04 MED ORDER — ALBUTEROL SULFATE (2.5 MG/3ML) 0.083% IN NEBU: 2.5000 mg | INHALATION_SOLUTION | RESPIRATORY_TRACT | 2 refills | Status: DC | PRN

## 2021-09-04 MED ORDER — CEFDINIR 300 MG PO CAPS: 300.0000 mg | ORAL_CAPSULE | Freq: Two times a day (BID) | ORAL | 0 refills | Status: AC

## 2021-09-04 MED ORDER — PREDNISONE 20 MG PO TABS: 40.0000 mg | ORAL_TABLET | Freq: Every day | ORAL | 0 refills | Status: AC

## 2021-09-04 MED ORDER — IPRATROPIUM-ALBUTEROL 0.5-2.5 (3) MG/3ML IN SOLN: 3.0000 mL | Freq: Three times a day (TID) | RESPIRATORY_TRACT | 2 refills | Status: DC

## 2021-09-04 MED ORDER — MONTELUKAST SODIUM 10 MG PO TABS: 10.0000 mg | ORAL_TABLET | Freq: Every day | ORAL | 3 refills | Status: DC

## 2021-09-04 MED ORDER — COMPRESSOR/NEBULIZER MISC: 1.0000 [IU] | 0 refills | Status: DC

## 2021-09-04 MED ORDER — ASPIRIN EC 81 MG PO TBEC: 81.0000 mg | DELAYED_RELEASE_TABLET | Freq: Every day | ORAL | 2 refills | Status: DC

## 2021-09-04 MED ORDER — DM-GUAIFENESIN ER 30-600 MG PO TB12: 1.0000 | ORAL_TABLET | Freq: Two times a day (BID) | ORAL | 0 refills | Status: DC

## 2021-09-04 MED ORDER — DOXYCYCLINE HYCLATE 100 MG PO TABS: 100.0000 mg | ORAL_TABLET | Freq: Two times a day (BID) | ORAL | 0 refills | Status: AC

## 2021-09-04 MED ORDER — BISMUTH SUBSALICYLATE 262 MG/15ML PO SUSP
30.0000 mL | ORAL | Status: DC | PRN
  Administered 2021-09-04 (×2): 30 mL via ORAL
  Filled 2021-09-04: qty 118

## 2021-09-04 MED ORDER — ALBUTEROL SULFATE HFA 108 (90 BASE) MCG/ACT IN AERS: 2.0000 | INHALATION_SPRAY | Freq: Four times a day (QID) | RESPIRATORY_TRACT | 2 refills | Status: DC | PRN

## 2021-09-04 MED ORDER — BUDESONIDE-FORMOTEROL FUMARATE 160-4.5 MCG/ACT IN AERO: 2.0000 | INHALATION_SPRAY | Freq: Two times a day (BID) | RESPIRATORY_TRACT | 12 refills | Status: DC

## 2021-09-04 MED ORDER — NICOTINE 21 MG/24HR TD PT24: 21.0000 mg | MEDICATED_PATCH | TRANSDERMAL | 0 refills | Status: AC

## 2021-09-04 MED ORDER — LEVOTHYROXINE SODIUM 75 MCG PO TABS: 75.0000 ug | ORAL_TABLET | Freq: Every day | ORAL | 5 refills | Status: AC

## 2021-09-04 MED ORDER — ATORVASTATIN CALCIUM 20 MG PO TABS: 20.0000 mg | ORAL_TABLET | Freq: Every day | ORAL | 3 refills | Status: AC

## 2021-09-04 MED ORDER — ESCITALOPRAM OXALATE 20 MG PO TABS: 20.0000 mg | ORAL_TABLET | Freq: Every day | ORAL | 3 refills | Status: AC

## 2021-09-04 NOTE — Progress Notes (Signed)
?  ?  SATURATION QUALIFICATIONS: (This note is used to comply with regulatory documentation for home oxygen) ?  ?Patient Saturations on Room Air at Rest = 87 % ?  ?Patient Saturations on Room Air while Ambulating = 84 % ?  ?Patient Saturations on 2 Liters of oxygen while Ambulating = 91 %  ?  ?  ?  ?Patient needs continuous O2 at 2 L/min continuously via nasal cannula with humidifier, with gaseous portability and conserving device   ? ?Diagnosis ---COPD ? ?Roxan Hockey, MD ? ? ?

## 2021-09-04 NOTE — Progress Notes (Signed)
Pt discharged home in stable condition. Discharge instructions given. Scripts sent to pharmacy of choice. No immediate questions or concerns at this time. Discharged from unit via wheelchair.  

## 2021-09-04 NOTE — Discharge Summary (Signed)
?                                                                                ? ? ?Melody White, is a 71 y.o. female  DOB [birth year]  MRN [id number]. ? ?Admission date:  08/31/2021  Admitting Physician  Georgean Spainhower Denton Brick, MD ? ?Discharge Date:  09/04/2021  ? ?Primary MD  Aletha Halim., PA-C ? ?Recommendations for primary care physician for things to follow:  ? ?1)You need oxygen at home at 2 L via nasal cannula continuously while awake and while asleep--- smoking or having open fires around oxygen can cause fire, significant injury and death ? ?2)Complete abstinence from tobacco/smoking advised ? ?3)Please take medications as prescribed including nebulizer treatments and antibiotics ? ?4)Follow-up with primary care physician Deatra Ina, Baldemar Friday., PA-C) within a week for recheck and reevaluation ? ?Admission Diagnosis  COPD exacerbation ([place]) [J44.1] ?Community acquired pneumonia, unspecified laterality [J18.9] ?COPD with acute exacerbation ([place]) [J44.1] ? ? ?Discharge Diagnosis  COPD exacerbation ([place]) [J44.1] ?Community acquired pneumonia, unspecified laterality [J18.9] ?COPD with acute exacerbation ([place]) [J44.1]   ? ?Principal Problem: ?  COPD exacerbation ([place]) ?Active Problems: ?  Acute respiratory failure with hypoxia ([place]) ?  Tobacco use ?  History of CVA (cerebrovascular accident) ?  HTN (hypertension) ?  Hypothyroidism ?  COPD with acute exacerbation ([place]) ?    ? ?Past Medical History:  ?Diagnosis Date  ? Hypertension   ? Stroke [facility])   ? Thyroid disease   ? ? ?Past Surgical History:  ?Procedure Laterality Date  ? ABDOMINAL HYSTERECTOMY    ? bowel obstruction    ? BREAST EXCISIONAL BIOPSY Right 20+ yrs ago  ? benign  ? INTRAMEDULLARY (IM) NAIL INTERTROCHANTERIC Left 10/11/2020  ? Procedure: INTRAMEDULLARY (IM) NAIL INTERTROCHANTRIC;  Surgeon: Shona Needles, MD;  Location: [place] [facility];  Service: Orthopedics;  Laterality: Left;  ? SHOULDER SURGERY    ? ? ? HPI  from the history and physical done on the day of  admission:  ? ? Melody White  is a 71 y.o. female with ongoing tobacco use and past medical history relevant for COPD, hypothyroidism, history of prior stroke, depression with anxiety who presents to the ED with cough, shortness of breath and increased work of breathing and found to be hypoxic in the ED ?-Additional history obtained from patient's niece Melody White at bedside ?-Apparently patient has been sick for over a couple weeks now with respiratory symptoms ?-She had a virtual visit with PCP about a week ago so she was started on doxycycline but had respiratory symptoms have worsened since then ?-No frank chest pains patient does have dyspnea at rest and with activity ?-Cough is at times productive ?-Patient denies high fevers ?-She went to the urgent care yesterday 08/31/2021 for evaluation and was sent to the ED due to significant respiratory symptoms and hypoxia ?-In the ED patient initially required 6 L of oxygen via nasal cannula, she is being weaned down to 3 to 4 L of oxygen at the time of my evaluation ?-Chest x-ray with possible pneumonia ?--COVID and flu negative ?-Troponin is not elevated ?-BNP is 85--WNL ?-Potassium is low at 3.1, creatinine 0.88 ?-CBC  WNL ? ? Hospital Course:  ? ?Brief Narrative:  ?71 y.o. female with ongoing tobacco use and past medical history relevant for COPD, hypothyroidism, history of prior stroke, depression with anxiety admitted on 09/01/2021 with acute hypoxic respiratory failure secondary to COPD exacerbation/Pneumonia ? ?Assessment and Plan: ?1)Acute COPD Exacerbation-due to community-acquired pneumonia pneumonia, ?Treated with IV Solu-Medrol , mucolytics, bronchodilators, pain and doxycycline as well as supplemental oxygen ?-Repeat chest x-ray on 09/04/2021 with bilateral patchy opacities ?--Clinically improved, ?-Okay to discharge home on p.o. Omnicef, doxycycline, bronchodilators, nebulizer machine,  mucolytics and supplemental oxygen  ? ?2)Acute hypoxic respiratory  failure--- secondary to #1 above ?-Manage as above in #1 ?-Initially required 4 to 5 L of oxygen currently requiring 2 L of oxygen at rest ?-Discharge home on O2 at 2 L/min, risk of smoking with home O2 emphasized to patient ?  ?3)Tobacco Abuse--- not interested in smoking cessation at this time ?-Encouraged to try to quit smoking for discharge with nicotine patch ?  ?4) depression/anxiety disorder----continue Lexapro 20 mg daily,  ?  ?5) history of prior stroke----continue aspirin, and Lipitor for secondary prevention ?  ?6)Hypothyroidism----TSH 2.26, continue levothyroxine 75 mcg daily ?  ?7)Hypokalemia--potassium is up to 4.4 from 3.1 after replacement ?  ?Disposition--- The patient is from: Home ?             Anticipated d/c is to: Home with Home oxygen ? ?Discharge Condition: Stable ? ?Follow UP with PCP  ? ?Diet and Activity recommendation:  As advised ? ?Discharge Instructions   ? ?Discharge Instructions   ? ? Call MD for:  difficulty breathing, headache or visual disturbances   Complete by: As directed ?  ? Call MD for:  persistant dizziness or light-headedness   Complete by: As directed ?  ? Call MD for:  persistant nausea and vomiting   Complete by: As directed ?  ? Call MD for:  temperature >100.4   Complete by: As directed ?  ? Diet - low sodium heart healthy   Complete by: As directed ?  ? Discharge instructions   Complete by: As directed ?  ? 1)You need oxygen at home at 2 L via nasal cannula continuously while awake and while asleep--- smoking or having open fires around oxygen can cause fire, significant injury and death ? ?2)Complete abstinence from tobacco/smoking advised ? ?3)Please take medications as prescribed including nebulizer treatments and antibiotics ? ?4)Follow-up with primary care physician Deatra Ina, Baldemar Friday., PA-C) within a week for recheck and reevaluation  ? For home use only DME Nebulizer machine   Complete by: As directed ?  ? Patient needs a nebulizer to treat with the following  condition: COPD (chronic obstructive pulmonary disease) ([place])  ? Length of Need: Lifetime  ? Increase activity slowly   Complete by: As directed ?  ? Statin already ordered   Complete by: As directed ?  ? ?  ? ? Discharge Medications  ? ?  ?Allergies as of 09/04/2021   ? ?   Reactions  ? Amoxicillin Other (See Comments)  ? other  ? Azithromycin Itching  ? Clindamycin Other (See Comments)  ? Funny feeling ?other  ? Codeine Itching, Other (See Comments)  ? other  ? Fexofenadine Other (See Comments)  ? Funny feeling ?other  ? Hydrocodone Bit-homatrop Mbr Itching  ? Sulfamethoxazole Nausea Only, Other (See Comments)  ? other  ? ?  ? ?  ?Medication List  ?  ? ?STOP taking these medications   ? ?amitriptyline  25 MG tablet ?Commonly known as: ELAVIL ?  ?diphenhydrAMINE 12.5 MG/5ML elixir ?Commonly known as: BENADRYL ?  ?doxycycline 100 MG capsule ?Commonly known as: VIBRAMYCIN ?Replaced by: doxycycline 100 MG tablet ?  ?Eliquis 2.5 MG Tabs tablet ?Generic drug: apixaban ?  ?fluticasone 50 MCG/ACT nasal spray ?Commonly known as: FLONASE ?  ?HYDROcodone-acetaminophen 5-325 MG tablet ?Commonly known as: NORCO/VICODIN ?  ?meclizine 25 MG tablet ?Commonly known as: ANTIVERT ?  ?nicotine 7 mg/24hr patch ?Commonly known as: NICODERM CQ - dosed in mg/24 hr ?Replaced by: nicotine 21 mg/24hr patch ?  ?tiZANidine 4 MG tablet ?Commonly known as: ZANAFLEX ?  ?vitamin C 1000 MG tablet ?  ?zinc gluconate 50 MG tablet ?  ? ?  ? ?TAKE these medications   ? ?acetaminophen 325 MG tablet ?Commonly known as: TYLENOL ?Take 1-2 tablets (325-650 mg total) by mouth every 6 (six) hours as needed for mild pain or moderate pain (pain score 1-3 or temp > 100.5). ?  ?albuterol 108 (90 Base) MCG/ACT inhaler ?Commonly known as: VENTOLIN HFA ?Inhale 2 puffs into the lungs every 6 (six) hours as needed for wheezing or shortness of breath. ?  ?albuterol (2.5 MG/3ML) 0.083% nebulizer solution ?Commonly known as: PROVENTIL ?Take 3 mLs (2.5 mg total) by  nebulization every 4 (four) hours as needed for wheezing or shortness of breath. ?  ?alendronate 70 MG tablet ?Commonly known as: FOSAMAX ?Take 70 mg by mouth once a week. ?  ?aspirin EC 81 MG tablet ?Take 1 tablet

## 2021-09-04 NOTE — Progress Notes (Addendum)
CSW spoke with [facility] [employer] to initiate home oxygen and home nebulizer. [employer] will deliver oxygen to patient's bedside prior to discharge. ? ?Patient will be discharged home. ? ?Madilyn Fireman, MSW, LCSW ?Transitions of Care  Clinical Social Worker II ?[phone] ? ?

## 2021-09-04 NOTE — Discharge Instructions (Signed)
1)You need oxygen at home at 2 L via nasal cannula continuously while awake and while asleep--- smoking or having open fires around oxygen can cause fire, significant injury and death ? ?2)Complete abstinence from tobacco/smoking advised ? ?3)Please take medications as prescribed including nebulizer treatments and antibiotics ? ?4)Follow-up with primary care physician Deatra Ina, Baldemar Friday., PA-C) within a week for recheck and reevaluation ?

## 2021-09-07 LAB — CULTURE, BLOOD (ROUTINE X 2)
Culture: NO GROWTH
Culture: NO GROWTH
Special Requests: ADEQUATE
Special Requests: ADEQUATE

## 2021-09-13 ENCOUNTER — Other Ambulatory Visit: Payer: Self-pay

## 2021-09-13 ENCOUNTER — Emergency Department (HOSPITAL_COMMUNITY)
Admission: EM | Admit: 2021-09-13 | Discharge: 2021-09-13 | Disposition: A | Discharge status: Home / Self Care | Payer: Medicare Other | Attending: Emergency Medicine | Admitting: Emergency Medicine

## 2021-09-13 ENCOUNTER — Emergency Department (HOSPITAL_COMMUNITY): Payer: Medicare Other

## 2021-09-13 ENCOUNTER — Encounter (HOSPITAL_COMMUNITY): Payer: Self-pay | Admitting: Emergency Medicine

## 2021-09-13 DIAGNOSIS — Z7951 Long term (current) use of inhaled steroids: Secondary | ICD-10-CM | POA: Insufficient documentation

## 2021-09-13 DIAGNOSIS — G43809 Other migraine, not intractable, without status migrainosus: Secondary | ICD-10-CM | POA: Insufficient documentation

## 2021-09-13 DIAGNOSIS — J449 Chronic obstructive pulmonary disease, unspecified: Secondary | ICD-10-CM | POA: Diagnosis not present

## 2021-09-13 DIAGNOSIS — R531 Weakness: Secondary | ICD-10-CM | POA: Insufficient documentation

## 2021-09-13 DIAGNOSIS — I10 Essential (primary) hypertension: Secondary | ICD-10-CM | POA: Diagnosis not present

## 2021-09-13 DIAGNOSIS — R079 Chest pain, unspecified: Secondary | ICD-10-CM | POA: Insufficient documentation

## 2021-09-13 DIAGNOSIS — E039 Hypothyroidism, unspecified: Secondary | ICD-10-CM | POA: Diagnosis not present

## 2021-09-13 DIAGNOSIS — Z7982 Long term (current) use of aspirin: Secondary | ICD-10-CM | POA: Insufficient documentation

## 2021-09-13 DIAGNOSIS — Z79899 Other long term (current) drug therapy: Secondary | ICD-10-CM | POA: Insufficient documentation

## 2021-09-13 DIAGNOSIS — R519 Headache, unspecified: Secondary | ICD-10-CM | POA: Diagnosis present

## 2021-09-13 LAB — HEPATIC FUNCTION PANEL
ALT: 15 U/L (ref 0–44)
AST: 17 U/L (ref 15–41)
Albumin: 3.3 g/dL — ABNORMAL LOW (ref 3.5–5.0)
Alkaline Phosphatase: 76 U/L (ref 38–126)
Bilirubin, Direct: 0.1 mg/dL (ref 0.0–0.2)
Indirect Bilirubin: 0.4 mg/dL (ref 0.3–0.9)
Total Bilirubin: 0.5 mg/dL (ref 0.3–1.2)
Total Protein: 6.6 g/dL (ref 6.5–8.1)

## 2021-09-13 LAB — BASIC METABOLIC PANEL
Anion gap: 8 (ref 5–15)
BUN: 15 mg/dL (ref 8–23)
CO2: 25 mmol/L (ref 22–32)
Calcium: 8.9 mg/dL (ref 8.9–10.3)
Chloride: 102 mmol/L (ref 98–111)
Creatinine, Ser: 0.76 mg/dL (ref 0.44–1.00)
GFR, Estimated: >60 mL/min (ref >60)
Glucose, Bld: 100 mg/dL — ABNORMAL HIGH (ref 70–99)
Potassium: 3.9 mmol/L (ref 3.5–5.1)
Sodium: 135 mmol/L (ref 135–145)

## 2021-09-13 LAB — CBC
HCT: 47 % — ABNORMAL HIGH (ref 36.0–46.0)
Hemoglobin: 14.6 g/dL (ref 12.0–15.0)
MCH: 29.6 pg (ref 26.0–34.0)
MCHC: 31.1 g/dL (ref 30.0–36.0)
MCV: 95.3 fL (ref 80.0–100.0)
Platelets: 258 10*3/uL (ref 150–400)
RBC: 4.93 MIL/uL (ref 3.87–5.11)
RDW: 14.4 % (ref 11.5–15.5)
WBC: 10.1 10*3/uL (ref 4.0–10.5)
nRBC: 0 % (ref 0.0–0.2)

## 2021-09-13 LAB — TROPONIN I (HIGH SENSITIVITY)
Troponin I (High Sensitivity): 4 ng/L (ref <18)
Troponin I (High Sensitivity): 4 ng/L (ref <18)

## 2021-09-13 LAB — LIPASE, BLOOD: Lipase: 19 U/L (ref 11–51)

## 2021-09-13 MED ORDER — DIPHENHYDRAMINE HCL 50 MG/ML IJ SOLN
12.5000 mg | Freq: Once | INTRAMUSCULAR | Status: AC
  Administered 2021-09-13: 12.5 mg via INTRAVENOUS
  Filled 2021-09-13: qty 1

## 2021-09-13 MED ORDER — DEXAMETHASONE SODIUM PHOSPHATE 10 MG/ML IJ SOLN
10.0000 mg | Freq: Once | INTRAMUSCULAR | Status: AC
  Administered 2021-09-13: 10 mg via INTRAVENOUS
  Filled 2021-09-13: qty 1

## 2021-09-13 MED ORDER — PROCHLORPERAZINE EDISYLATE 10 MG/2ML IJ SOLN
10.0000 mg | Freq: Once | INTRAMUSCULAR | Status: AC
  Administered 2021-09-13: 10 mg via INTRAVENOUS
  Filled 2021-09-13: qty 2

## 2021-09-13 MED ORDER — SODIUM CHLORIDE 0.9 % IV BOLUS
1000.0000 mL | Freq: Once | INTRAVENOUS | Status: AC
  Administered 2021-09-13: 1000 mL via INTRAVENOUS

## 2021-09-13 NOTE — ED Triage Notes (Signed)
Per niece, pt was [facility] from [facility] last [facility], and continues to have weakness, migraines with visual changes, multiples falls and chest tightness. ?

## 2021-09-13 NOTE — ED Notes (Signed)
Patient ambulated to restroom without giving urine sample. Patient educated on need for urine sample.  ?

## 2021-09-13 NOTE — ED Provider Notes (Signed)
?[facility] EMERGENCY DEPARTMENT ?Provider Note ? ? ?CSN: [id number] ?Arrival date & time: 09/13/21  1158 ? ?  ? ?History ? ?Chief Complaint  ?Patient presents with  ? Weakness  ? Migraine  ? Chest Pain  ? ? ?Melody White is a 71 y.o. female with a history including COPD, history of CVA, hypertension, hypothyroidism who was just discharged from this hospital on April 23 where she was treated for an acute pneumonia presenting for evaluation of multiple complaints, including generalized weakness without focal weakness, daughter at bedside stating that she fell last week secondary to her weakness. She denies injury with this fall. She also describes a generalized headache which has been present since yesterday, including scotoma, describing seeing black spots in her field of vision.  She states this reminds her of migraine headaches that she had when she was younger but states has been at least 30 years since she has had a similar migraine headache.  She has stopped smoking with her recent admission and wore a nicotine patch during this admission but not since going home.  she wonders if the headaches might be from nicotine withdrawal.  She denies nausea or vomiting, she also denies shortness of breath, abdominal pain.  She does report burning pain in her lower mid chest region which is triggered by eating and drinking.  She reports she does have significant history of acid reflux problems.  She states it feels like her food is hanging up in her throat but she denies vomiting or any regurgitation of food or fluids.  She has been eating since being discharged home but has had a less than normal appetite.  She has not seen her PCP in follow-up yet from her recent hospitalization.  She has had no treatment for her symptoms prior to arrival. ? ?The history is provided by the patient and a relative.  ? ?  ? ?Home Medications ?Prior to Admission medications   ?Medication Sig Start Date End Date Taking? Authorizing Provider   ?acetaminophen (TYLENOL) 325 MG tablet Take 1-2 tablets (325-650 mg total) by mouth every 6 (six) hours as needed for mild pain or moderate pain (pain score 1-3 or temp > 100.5). 10/13/20  Yes Dagar, Geralynn RileAnjali, MD  ?albuterol (PROVENTIL) (2.5 MG/3ML) 0.083% nebulizer solution Take 3 mLs (2.5 mg total) by nebulization every 4 (four) hours as needed for wheezing or shortness of breath. 09/04/21 09/04/22 Yes Emokpae, Courage, MD  ?albuterol (VENTOLIN HFA) 108 (90 Base) MCG/ACT inhaler Inhale 2 puffs into the lungs every 6 (six) hours as needed for wheezing or shortness of breath. 09/04/21  Yes Emokpae, Courage, MD  ?alendronate (FOSAMAX) 70 MG tablet Take 70 mg by mouth once a week. 08/29/21  Yes [provider]  ?aspirin EC 81 MG tablet Take 1 tablet (81 mg total) by mouth daily with breakfast. 09/04/21 09/04/22 Yes Emokpae, Courage, MD  ?atorvastatin (LIPITOR) 20 MG tablet Take 1 tablet (20 mg total) by mouth daily. 09/05/21  Yes Emokpae, Courage, MD  ?budesonide-formoterol (SYMBICORT) 160-4.5 MCG/ACT inhaler Inhale 2 puffs into the lungs 2 (two) times daily. 09/04/21  Yes Shon HaleEmokpae, Courage, MD  ?calcium carbonate (CALCIUM 600) 1500 (600 Ca) MG TABS tablet Take 1 tablet (1,500 mg total) by mouth 2 (two) times daily with a meal. 10/13/20  Yes Dagar, Geralynn RileAnjali, MD  ?Cholecalciferol 25 MCG (1000 UT) tablet Take 1 tablet (1,000 Units total) by mouth daily. 10/13/20  Yes Dagar, Geralynn RileAnjali, MD  ?dextromethorphan-guaiFENesin (MUCINEX DM) 30-600 MG 12hr tablet Take 1  tablet by mouth 2 (two) times daily. 09/04/21  Yes Emokpae, Courage, MD  ?diazepam (VALIUM) 10 MG tablet Take 10 mg by mouth at bedtime as needed for anxiety or sleep. Into the vagina every night as needed for pelvic floor spasms 11/29/18  Yes [provider]  ?escitalopram (LEXAPRO) 20 MG tablet Take 1 tablet (20 mg total) by mouth daily. 09/04/21  Yes Shon Hale, MD  ?levothyroxine (SYNTHROID) 75 MCG tablet Take 1 tablet (75 mcg total) by mouth daily. 09/04/21   Yes Emokpae, Courage, MD  ?montelukast (SINGULAIR) 10 MG tablet Take 1 tablet (10 mg total) by mouth at bedtime. 09/04/21  Yes Shon Hale, MD  ?Multiple Vitamin (MULTIVITAMIN) capsule Take 1 capsule by mouth daily.   Yes [provider]  ?ipratropium-albuterol (DUONEB) 0.5-2.5 (3) MG/3ML SOLN Take 3 mLs by nebulization 3 (three) times daily. ?Patient not taking: Reported on 09/13/2021 09/04/21   Shon Hale, MD  ?Nebulizers (COMPRESSOR/NEBULIZER) MISC 1 Units by Does not apply route as directed. 09/04/21   Shon Hale, MD  ?nicotine (NICODERM CQ - DOSED IN MG/24 HOURS) 21 mg/24hr patch Place 1 patch (21 mg total) onto the skin daily for 28 days. ?Patient not taking: Reported on 09/13/2021 09/04/21 10/02/21  Shon Hale, MD  ?   ? ?Allergies    ?Amoxicillin, Azithromycin, Clindamycin, Codeine, Fexofenadine, Hydrocodone bit-homatrop mbr, and Sulfamethoxazole   ? ?Review of Systems   ?Review of Systems  ?Constitutional:  Positive for appetite change and fatigue. Negative for chills and fever.  ?HENT:  Negative for congestion and sore throat.   ?Eyes: Negative.   ?Respiratory:  Negative for chest tightness and shortness of breath.   ?Cardiovascular:  Positive for chest pain.  ?Gastrointestinal:  Negative for abdominal pain, constipation, diarrhea, nausea and vomiting.  ?Genitourinary: Negative.   ?Musculoskeletal:  Negative for arthralgias, joint swelling and neck pain.  ?Skin: Negative.  Negative for rash and wound.  ?Neurological:  Positive for headaches. Negative for dizziness, weakness, light-headedness and numbness.  ?Psychiatric/Behavioral: Negative.    ? ?Physical Exam ?Updated Vital Signs ?BP (!) 141/79   Pulse 85   Temp 98.6 ?F (37 ?C) (Oral)   Resp 16   Ht 5\' 6"  (1.676 m)   Wt 50.8 kg   SpO2 92%   BMI 18.08 kg/m?  ?Physical Exam ?Vitals and nursing note reviewed.  ?Constitutional:   ?   Appearance: She is well-developed.  ?HENT:  ?   Head: Normocephalic and atraumatic.  ?   Right  Ear: Tympanic membrane normal.  ?   Left Ear: Tympanic membrane normal.  ?Eyes:  ?   Extraocular Movements: Extraocular movements intact.  ?   Conjunctiva/sclera: Conjunctivae normal.  ?   Pupils: Pupils are equal, round, and reactive to light.  ?Cardiovascular:  ?   Rate and Rhythm: Normal rate and regular rhythm.  ?   Heart sounds: Normal heart sounds.  ?Pulmonary:  ?   Effort: Pulmonary effort is normal.  ?   Breath sounds: Normal breath sounds. No wheezing.  ?Abdominal:  ?   General: Bowel sounds are normal.  ?   Palpations: Abdomen is soft.  ?   Tenderness: There is no abdominal tenderness.  ?Musculoskeletal:     ?   General: Normal range of motion.  ?   Cervical back: Normal range of motion and neck supple.  ?Lymphadenopathy:  ?   Cervical: No cervical adenopathy.  ?Skin: ?   General: Skin is warm and dry.  ?   Findings: No  rash.  ?Neurological:  ?   General: No focal deficit present.  ?   Mental Status: She is alert and oriented to person, place, and time.  ?   GCS: GCS eye subscore is 4. GCS verbal subscore is 5. GCS motor subscore is 6.  ?   Cranial Nerves: No cranial nerve deficit.  ?   Sensory: No sensory deficit.  ?   Coordination: Coordination normal.  ?   Gait: Gait normal.  ?   Deep Tendon Reflexes: Reflexes normal.  ?   Comments: Normal heel-shin, normal rapid alternating movements. Cranial nerves III-XII intact.  No pronator drift.  ?Psychiatric:     ?   Speech: Speech normal.     ?   Behavior: Behavior normal.     ?   Thought Content: Thought content normal.  ? ? ?ED Results / Procedures / Treatments   ?Labs ?(all labs ordered are listed, but only abnormal results are displayed) ?Labs Reviewed  ?BASIC METABOLIC PANEL - Abnormal; Notable for the following components:  ?    Result Value  ? Glucose, Bld 100 (*)   ? All other components within normal limits  ?CBC - Abnormal; Notable for the following components:  ? HCT 47.0 (*)   ? All other components within normal limits  ?HEPATIC FUNCTION PANEL -  Abnormal; Notable for the following components:  ? Albumin 3.3 (*)   ? All other components within normal limits  ?LIPASE, BLOOD  ?URINALYSIS, ROUTINE W REFLEX MICROSCOPIC  ?TROPONIN I (HIGH SENSITIVITY)  ?TROPONIN

## 2021-09-13 NOTE — ED Provider Triage Note (Signed)
Emergency Medicine Provider Triage Evaluation Note ? ?Melody White , a 71 y.o. female  was evaluated in triage.  Pt complains of multiple complaints including significant generalized weakness with multiple falls along with migraine like headache including vision changes although she denies history of migraines in at least the past 30 years.  Also describes midsternal chest pain described as burning and worse with food consumption.  Denies shortness of breath was recently admitted for pneumonia but states the cough and shortness of breath are improved.  Denies focal weakness ? ?Review of Systems  ?Positive: Chest pain, headache, multiple falls, generalized weakness ?Negative: Shortness of breath, fever, cough, abdominal pain, nausea or vomiting ? ?Physical Exam  ?BP 126/78 (BP Location: Right Arm)   Pulse 98   Temp 98.6 ?F (37 ?C) (Oral)   Resp 20   Ht 5\' 6"  (1.676 m)   Wt 50.8 kg   SpO2 93%   BMI 18.08 kg/m?  ?Gen:   Awake, no distress   ?Resp:  Normal effort  ?MSK:   Moves extremities without difficulty  ?Other:  Cachectic female, clinically appears dehydrated.  She does have tenderness to palp patient in the left upper quadrant without guarding. ?  ?Medical Decision Making  ?Medically screening exam initiated at 1:08 PM.  Appropriate orders placed.  Melody White was informed that the remainder of the evaluation will be completed by another provider, this initial triage assessment does not replace that evaluation, and the importance of remaining in the ED until their evaluation is complete. ? ?Labs, CT imaging ordered.  Pending treatment room. ?  Melody Cake, PA-C ?09/13/21 1312 ? ?

## 2021-09-13 NOTE — Discharge Instructions (Signed)
Your work-up today including your CT scan are reassuring.  As discussed I suspect your headache may be due to your recent smoking cessation and nicotine withdrawal.  It is important for you to make sure you are drinking plenty of fluids to maintain hydration.  I suggest using extra strength Tylenol if needed for return of headache.  Plan to see your primary MD for recheck if your headaches have not resolved by the end of this week. ?

## 2021-11-07 MED ORDER — FENTANYL CITRATE (PF) 100 MCG/2ML IJ SOLN
INTRAMUSCULAR | Status: AC
  Filled 2021-11-07: qty 2

## 2021-11-07 MED ORDER — MIDAZOLAM HCL 2 MG/2ML IJ SOLN
INTRAMUSCULAR | Status: AC
  Filled 2021-11-07: qty 2

## 2022-02-09 ENCOUNTER — Other Ambulatory Visit: Payer: Self-pay | Admitting: Gastroenterology

## 2022-02-09 DIAGNOSIS — R1319 Other dysphagia: Secondary | ICD-10-CM

## 2022-02-09 DIAGNOSIS — K219 Gastro-esophageal reflux disease without esophagitis: Secondary | ICD-10-CM

## 2022-02-09 DIAGNOSIS — R1310 Dysphagia, unspecified: Secondary | ICD-10-CM

## 2022-02-13 ENCOUNTER — Ambulatory Visit
Admission: RE | Admit: 2022-02-13 | Discharge: 2022-02-13 | Disposition: A | Discharge status: Home / Self Care | Payer: Medicare Other | Source: Ambulatory Visit | Attending: Gastroenterology | Admitting: Gastroenterology

## 2022-02-13 DIAGNOSIS — R1319 Other dysphagia: Secondary | ICD-10-CM

## 2022-02-13 DIAGNOSIS — K219 Gastro-esophageal reflux disease without esophagitis: Secondary | ICD-10-CM

## 2022-02-13 DIAGNOSIS — R1310 Dysphagia, unspecified: Secondary | ICD-10-CM

## 2022-07-15 ENCOUNTER — Encounter (HOSPITAL_COMMUNITY): Payer: Self-pay

## 2022-07-15 ENCOUNTER — Observation Stay (HOSPITAL_COMMUNITY): Payer: Medicare Other

## 2022-07-15 ENCOUNTER — Emergency Department (HOSPITAL_COMMUNITY): Payer: Medicare Other

## 2022-07-15 ENCOUNTER — Inpatient Hospital Stay (HOSPITAL_COMMUNITY)
Admission: EM | Admit: 2022-07-15 | Discharge: 2022-07-21 | DRG: 190 | Disposition: A | Discharge status: Home / Self Care | Payer: Medicare Other | Attending: Internal Medicine | Admitting: Internal Medicine

## 2022-07-15 DIAGNOSIS — Z7983 Long term (current) use of bisphosphonates: Secondary | ICD-10-CM

## 2022-07-15 DIAGNOSIS — I110 Hypertensive heart disease with heart failure: Secondary | ICD-10-CM | POA: Diagnosis present

## 2022-07-15 DIAGNOSIS — K8020 Calculus of gallbladder without cholecystitis without obstruction: Secondary | ICD-10-CM | POA: Diagnosis present

## 2022-07-15 DIAGNOSIS — J441 Chronic obstructive pulmonary disease with (acute) exacerbation: Secondary | ICD-10-CM

## 2022-07-15 DIAGNOSIS — J9601 Acute respiratory failure with hypoxia: Secondary | ICD-10-CM | POA: Diagnosis not present

## 2022-07-15 DIAGNOSIS — J439 Emphysema, unspecified: Secondary | ICD-10-CM | POA: Diagnosis not present

## 2022-07-15 DIAGNOSIS — R053 Chronic cough: Secondary | ICD-10-CM | POA: Diagnosis present

## 2022-07-15 DIAGNOSIS — F17210 Nicotine dependence, cigarettes, uncomplicated: Secondary | ICD-10-CM | POA: Diagnosis present

## 2022-07-15 DIAGNOSIS — R627 Adult failure to thrive: Secondary | ICD-10-CM | POA: Diagnosis present

## 2022-07-15 DIAGNOSIS — J4489 Other specified chronic obstructive pulmonary disease: Secondary | ICD-10-CM

## 2022-07-15 DIAGNOSIS — Z90710 Acquired absence of both cervix and uterus: Secondary | ICD-10-CM

## 2022-07-15 DIAGNOSIS — I700 Atherosclerosis of aorta: Secondary | ICD-10-CM | POA: Diagnosis present

## 2022-07-15 DIAGNOSIS — Z681 Body mass index (BMI) 19 or less, adult: Secondary | ICD-10-CM

## 2022-07-15 DIAGNOSIS — D72829 Elevated white blood cell count, unspecified: Secondary | ICD-10-CM | POA: Diagnosis not present

## 2022-07-15 DIAGNOSIS — E440 Moderate protein-calorie malnutrition: Secondary | ICD-10-CM | POA: Diagnosis present

## 2022-07-15 DIAGNOSIS — Z888 Allergy status to other drugs, medicaments and biological substances status: Secondary | ICD-10-CM

## 2022-07-15 DIAGNOSIS — R0902 Hypoxemia: Secondary | ICD-10-CM | POA: Diagnosis not present

## 2022-07-15 DIAGNOSIS — J690 Pneumonitis due to inhalation of food and vomit: Secondary | ICD-10-CM | POA: Diagnosis present

## 2022-07-15 DIAGNOSIS — Z79890 Hormone replacement therapy (postmenopausal): Secondary | ICD-10-CM

## 2022-07-15 DIAGNOSIS — R1310 Dysphagia, unspecified: Secondary | ICD-10-CM | POA: Diagnosis present

## 2022-07-15 DIAGNOSIS — I5031 Acute diastolic (congestive) heart failure: Secondary | ICD-10-CM | POA: Diagnosis present

## 2022-07-15 DIAGNOSIS — Z880 Allergy status to penicillin: Secondary | ICD-10-CM

## 2022-07-15 DIAGNOSIS — E039 Hypothyroidism, unspecified: Secondary | ICD-10-CM

## 2022-07-15 DIAGNOSIS — Z1152 Encounter for screening for COVID-19: Secondary | ICD-10-CM

## 2022-07-15 DIAGNOSIS — Z79899 Other long term (current) drug therapy: Secondary | ICD-10-CM

## 2022-07-15 DIAGNOSIS — Z720 Tobacco use: Secondary | ICD-10-CM

## 2022-07-15 DIAGNOSIS — Z885 Allergy status to narcotic agent status: Secondary | ICD-10-CM

## 2022-07-15 DIAGNOSIS — Z881 Allergy status to other antibiotic agents status: Secondary | ICD-10-CM

## 2022-07-15 DIAGNOSIS — Z8261 Family history of arthritis: Secondary | ICD-10-CM

## 2022-07-15 DIAGNOSIS — E860 Dehydration: Secondary | ICD-10-CM | POA: Diagnosis present

## 2022-07-15 DIAGNOSIS — Z7951 Long term (current) use of inhaled steroids: Secondary | ICD-10-CM

## 2022-07-15 DIAGNOSIS — Z7982 Long term (current) use of aspirin: Secondary | ICD-10-CM

## 2022-07-15 DIAGNOSIS — Z923 Personal history of irradiation: Secondary | ICD-10-CM

## 2022-07-15 DIAGNOSIS — Z882 Allergy status to sulfonamides status: Secondary | ICD-10-CM

## 2022-07-15 DIAGNOSIS — Z8572 Personal history of non-Hodgkin lymphomas: Secondary | ICD-10-CM

## 2022-07-15 DIAGNOSIS — Z8673 Personal history of transient ischemic attack (TIA), and cerebral infarction without residual deficits: Secondary | ICD-10-CM

## 2022-07-15 LAB — CBC WITH DIFFERENTIAL/PLATELET
Abs Immature Granulocytes: 0.01 10*3/uL (ref 0.00–0.07)
Basophils Absolute: 0.1 10*3/uL (ref 0.0–0.1)
Basophils Relative: 1 %
Eosinophils Absolute: 0.3 10*3/uL (ref 0.0–0.5)
Eosinophils Relative: 6 %
HCT: 53.2 % — ABNORMAL HIGH (ref 36.0–46.0)
Hemoglobin: 17 g/dL — ABNORMAL HIGH (ref 12.0–15.0)
Immature Granulocytes: 0 %
Lymphocytes Relative: 24 %
Lymphs Abs: 1.4 10*3/uL (ref 0.7–4.0)
MCH: 31.7 pg (ref 26.0–34.0)
MCHC: 32 g/dL (ref 30.0–36.0)
MCV: 99.1 fL (ref 80.0–100.0)
Monocytes Absolute: 0.6 10*3/uL (ref 0.1–1.0)
Monocytes Relative: 10 %
Neutro Abs: 3.4 10*3/uL (ref 1.7–7.7)
Neutrophils Relative %: 59 %
Platelets: 189 10*3/uL (ref 150–400)
RBC: 5.37 MIL/uL — ABNORMAL HIGH (ref 3.87–5.11)
RDW: 14.9 % (ref 11.5–15.5)
WBC: 5.8 10*3/uL (ref 4.0–10.5)
nRBC: 0 % (ref 0.0–0.2)

## 2022-07-15 LAB — BASIC METABOLIC PANEL
Anion gap: 8 (ref 5–15)
BUN: 19 mg/dL (ref 8–23)
CO2: 27 mmol/L (ref 22–32)
Calcium: 9 mg/dL (ref 8.9–10.3)
Chloride: 103 mmol/L (ref 98–111)
Creatinine, Ser: 0.75 mg/dL (ref 0.44–1.00)
GFR, Estimated: >60 mL/min (ref >60)
Glucose, Bld: 90 mg/dL (ref 70–99)
Potassium: 4.3 mmol/L (ref 3.5–5.1)
Sodium: 138 mmol/L (ref 135–145)

## 2022-07-15 LAB — BRAIN NATRIURETIC PEPTIDE: B Natriuretic Peptide: 24.3 pg/mL (ref 0.0–100.0)

## 2022-07-15 LAB — RESP PANEL BY RT-PCR (RSV, FLU A&B, COVID)  RVPGX2
Influenza A by PCR: NEGATIVE
Influenza B by PCR: NEGATIVE
Resp Syncytial Virus by PCR: NEGATIVE
SARS Coronavirus 2 by RT PCR: NEGATIVE

## 2022-07-15 MED ORDER — UMECLIDINIUM BROMIDE 62.5 MCG/ACT IN AEPB
1.0000 | INHALATION_SPRAY | Freq: Every day | RESPIRATORY_TRACT | Status: DC
  Filled 2022-07-15: qty 7

## 2022-07-15 MED ORDER — ENOXAPARIN SODIUM 40 MG/0.4ML IJ SOSY
40.0000 mg | PREFILLED_SYRINGE | INTRAMUSCULAR | Status: DC
  Administered 2022-07-16 – 2022-07-20 (×6): 40 mg via SUBCUTANEOUS
  Filled 2022-07-15 (×6): qty 0.4

## 2022-07-15 MED ORDER — ALBUTEROL SULFATE (2.5 MG/3ML) 0.083% IN NEBU
2.5000 mg | INHALATION_SOLUTION | RESPIRATORY_TRACT | Status: DC | PRN
  Administered 2022-07-17: 2.5 mg via RESPIRATORY_TRACT
  Filled 2022-07-15: qty 3

## 2022-07-15 MED ORDER — PREDNISONE 20 MG PO TABS
40.0000 mg | ORAL_TABLET | Freq: Every day | ORAL | Status: AC
  Administered 2022-07-16 – 2022-07-20 (×5): 40 mg via ORAL
  Filled 2022-07-15 (×5): qty 2

## 2022-07-15 MED ORDER — IPRATROPIUM-ALBUTEROL 0.5-2.5 (3) MG/3ML IN SOLN
3.0000 mL | Freq: Once | RESPIRATORY_TRACT | Status: AC
  Administered 2022-07-15: 3 mL via RESPIRATORY_TRACT
  Filled 2022-07-15: qty 3

## 2022-07-15 MED ORDER — METHYLPREDNISOLONE SODIUM SUCC 125 MG IJ SOLR
125.0000 mg | Freq: Once | INTRAMUSCULAR | Status: AC
  Administered 2022-07-15: 125 mg via INTRAVENOUS
  Filled 2022-07-15: qty 2

## 2022-07-15 MED ORDER — MOMETASONE FURO-FORMOTEROL FUM 200-5 MCG/ACT IN AERO
2.0000 | INHALATION_SPRAY | Freq: Two times a day (BID) | RESPIRATORY_TRACT | Status: DC
  Administered 2022-07-16 – 2022-07-21 (×11): 2 via RESPIRATORY_TRACT
  Filled 2022-07-15: qty 8.8

## 2022-07-15 MED ORDER — NICOTINE 14 MG/24HR TD PT24
14.0000 mg | MEDICATED_PATCH | Freq: Every day | TRANSDERMAL | Status: DC
  Filled 2022-07-15 (×5): qty 1

## 2022-07-15 MED ORDER — SODIUM CHLORIDE 0.9 % IV SOLN
1.0000 g | INTRAVENOUS | Status: DC
  Administered 2022-07-16: 1 g via INTRAVENOUS
  Filled 2022-07-15 (×2): qty 10

## 2022-07-15 MED ORDER — ALBUTEROL SULFATE (2.5 MG/3ML) 0.083% IN NEBU
5.0000 mg | INHALATION_SOLUTION | Freq: Once | RESPIRATORY_TRACT | Status: AC
  Administered 2022-07-15: 5 mg via RESPIRATORY_TRACT
  Filled 2022-07-15: qty 6

## 2022-07-15 NOTE — Assessment & Plan Note (Signed)
Due to COPD exacerbation O2 via [place] Cont pulse ox

## 2022-07-15 NOTE — ED Triage Notes (Signed)
Pt arrived via POV, send from [facility]. Pt seen for increasing fatigue, headache, vomiting this morning. Started with left groin pain while entering ED. Pt COVID and flu tested negative.  Pt spo2 85% in triage, maintained at same. Placed on 2L [place] with little improvement, bumped to 4L, spo2 92%.

## 2022-07-15 NOTE — ED Notes (Signed)
ED TO INPATIENT HANDOFF REPORT  ED Nurse Name and Phone #: Atha Starks Name/Age/Gender Melody White 72 y.o. female Room/Bed: WA10/WA10  Code Status   Code Status: Full Code  Home/SNF/Other Home Patient oriented to: self, place, time, and situation Is this baseline? Yes   Triage Complete: Triage complete  Chief Complaint COPD exacerbation ([place]) [J44.1]  Triage Note Pt arrived via [place], send from [place]. Pt seen for increasing fatigue, headache, vomiting this morning. Started with left groin pain while entering ED. Pt COVID and flu tested negative.  Pt spo2 85% in triage, maintained at same. Placed on 2L  with little improvement, bumped to 4L, spo2 92%.    Allergies Allergies  Allergen Reactions   Amoxicillin Other (See Comments)    other    Azithromycin Itching   Clindamycin Other (See Comments)    Funny feeling other    Codeine Itching and Other (See Comments)    other    Fexofenadine Other (See Comments)    Funny feeling other    Hydrocodone Bit-Homatrop Mbr Itching   Sulfamethoxazole Nausea Only and Other (See Comments)    other     Level of Care/Admitting Diagnosis ED Disposition     ED Disposition  Admit   Condition  --   [place]: [place] [[id number]]  Level of Care: Med-Surg [16]  May place patient in observation at [facility] or [place] if equivalent level of care is available:: No  Covid Evaluation: Asymptomatic - no recent exposure (last 10 days) testing not required  Diagnosis: COPD exacerbation [facility]) [id number]  Admitting Physician: Etta Quill [phone]  Attending Physician: Etta Quill [phone]          B Medical/Surgery History Past Medical History:  Diagnosis Date   Hypertension    Stroke [facility])    Thyroid disease    Past Surgical History:  Procedure Laterality Date   ABDOMINAL HYSTERECTOMY     bowel obstruction     BREAST EXCISIONAL BIOPSY Right 20+ yrs ago   benign    INTRAMEDULLARY (IM) NAIL INTERTROCHANTERIC Left 10/11/2020   Procedure: INTRAMEDULLARY (IM) NAIL INTERTROCHANTRIC;  Surgeon: Shona Needles, MD;  Location: [facility];  Service: Orthopedics;  Laterality: Left;   SHOULDER SURGERY       A IV Location/Drains/Wounds Patient Lines/Drains/Airways Status     Active Line/Drains/Airways     Name Placement date Placement time Site Days   Peripheral IV 07/15/22 20 G Left;Posterior Hand 07/15/22  1628  Hand  less than 1   Incision (Closed) 10/11/20 Hip Left 10/11/20  0819  -- 642            Intake/Output Last 24 hours No intake or output data in the 24 hours ending 07/15/22 2311  Labs/Imaging Results for orders placed or performed during the hospital encounter of 07/15/22 (from the past 48 hour(s))  Basic metabolic panel     Status: None   Collection Time: 07/15/22  4:27 PM  Result Value Ref Range   Sodium 138 135 - 145 mmol/L   Potassium 4.3 3.5 - 5.1 mmol/L   Chloride 103 98 - 111 mmol/L   CO2 27 22 - 32 mmol/L   Glucose, Bld 90 70 - 99 mg/dL    Comment: Glucose reference range applies only to samples taken after fasting for at least 8 hours.   BUN 19 8 - 23 mg/dL   Creatinine, Ser 0.75 0.44 - 1.00 mg/dL  Calcium 9.0 8.9 - 10.3 mg/dL   GFR, Estimated >60 >60 mL/min    Comment: (NOTE) Calculated using the CKD-EPI Creatinine Equation (2021)    Anion gap 8 5 - 15    Comment: Performed at [facility], [place] [address] [place], [place] [address]  CBC with Differential     Status: Abnormal   Collection Time: 07/15/22  4:27 PM  Result Value Ref Range   WBC 5.8 4.0 - 10.5 K/uL   RBC 5.37 (H) 3.87 - 5.11 MIL/uL   Hemoglobin 17.0 (H) 12.0 - 15.0 g/dL   HCT 53.2 (H) 36.0 - 46.0 %   MCV 99.1 80.0 - 100.0 fL   MCH 31.7 26.0 - 34.0 pg   MCHC 32.0 30.0 - 36.0 g/dL   RDW 14.9 11.5 - 15.5 %   Platelets 189 150 - 400 K/uL   nRBC 0.0 0.0 - 0.2 %   Neutrophils Relative % 59 %   Neutro Abs 3.4 1.7 - 7.7 K/uL   Lymphocytes  Relative 24 %   Lymphs Abs 1.4 0.7 - 4.0 K/uL   Monocytes Relative 10 %   Monocytes Absolute 0.6 0.1 - 1.0 K/uL   Eosinophils Relative 6 %   Eosinophils Absolute 0.3 0.0 - 0.5 K/uL   Basophils Relative 1 %   Basophils Absolute 0.1 0.0 - 0.1 K/uL   Immature Granulocytes 0 %   Abs Immature Granulocytes 0.01 0.00 - 0.07 K/uL    Comment: Performed at [facility], [place] [address] [place], [place] [address]  Brain natriuretic peptide     Status: None   Collection Time: 07/15/22  4:27 PM  Result Value Ref Range   B Natriuretic Peptide 24.3 0.0 - 100.0 pg/mL    Comment: Performed at [facility], [place] [address] [place], [place] [address]  Resp panel by RT-PCR (RSV, Flu A&B, Covid) Anterior Nasal Swab     Status: None   Collection Time: 07/15/22  4:27 PM   Specimen: Anterior Nasal Swab  Result Value Ref Range   SARS Coronavirus 2 by RT PCR NEGATIVE NEGATIVE    Comment: (NOTE) SARS-CoV-2 target nucleic acids are NOT DETECTED.  The SARS-CoV-2 RNA is generally detectable in upper respiratory specimens during the acute phase of infection. The lowest concentration of SARS-CoV-2 viral copies this assay can detect is 138 copies/mL. A negative result does not preclude SARS-Cov-2 infection and should not be used as the sole basis for treatment or other patient management decisions. A negative result may occur with  improper specimen collection/handling, submission of specimen other than nasopharyngeal swab, presence of viral mutation(s) within the areas targeted by this assay, and inadequate number of viral copies(<138 copies/mL). A negative result must be combined with clinical observations, patient history, and epidemiological information. The expected result is Negative.  Fact Sheet for Patients:  EntrepreneurPulse.com.au  Fact Sheet for Healthcare Providers:  IncredibleEmployment.be  This test is no t yet approved  or cleared by the [place] FDA and  has been authorized for detection and/or diagnosis of SARS-CoV-2 by FDA under an Emergency Use Authorization (EUA). This EUA will remain  in effect (meaning this test can be used) for the duration of the COVID-19 declaration under Section 564(b)(1) of the Act, 21 U.S.C.section 360bbb-3(b)(1), unless the authorization is terminated  or revoked sooner.       Influenza A by PCR NEGATIVE NEGATIVE   Influenza B by PCR NEGATIVE NEGATIVE    Comment: (NOTE) The Xpert  Xpress SARS-CoV-2/FLU/RSV plus assay is intended as an aid in the diagnosis of influenza from Nasopharyngeal swab specimens and should not be used as a sole basis for treatment. Nasal washings and aspirates are unacceptable for Xpert Xpress SARS-CoV-2/FLU/RSV testing.  Fact Sheet for Patients: EntrepreneurPulse.com.au  Fact Sheet for Healthcare Providers: IncredibleEmployment.be  This test is not yet approved or cleared by the [place] FDA and has been authorized for detection and/or diagnosis of SARS-CoV-2 by FDA under an Emergency Use Authorization (EUA). This EUA will remain in effect (meaning this test can be used) for the duration of the COVID-19 declaration under Section 564(b)(1) of the Act, 21 U.S.C. section 360bbb-3(b)(1), unless the authorization is terminated or revoked.     Resp Syncytial Virus by PCR NEGATIVE NEGATIVE    Comment: (NOTE) Fact Sheet for Patients: EntrepreneurPulse.com.au  Fact Sheet for Healthcare Providers: IncredibleEmployment.be  This test is not yet approved or cleared by the [place] FDA and has been authorized for detection and/or diagnosis of SARS-CoV-2 by FDA under an Emergency Use Authorization (EUA). This EUA will remain in effect (meaning this test can be used) for the duration of the COVID-19 declaration under Section 564(b)(1) of the Act, 21 U.S.C. section  360bbb-3(b)(1), unless the authorization is terminated or revoked.  Performed at [facility], [place] [address], [place], [place] [address]    CT CHEST WO CONTRAST  Result Date: 07/15/2022 CLINICAL DATA:  Hypoxia. EXAM: CT CHEST WITHOUT CONTRAST TECHNIQUE: Multidetector CT imaging of the chest was performed following the standard protocol without IV contrast. RADIATION DOSE REDUCTION: This exam was performed according to the departmental dose-optimization program which includes automated exposure control, adjustment of the mA and/or kV according to patient size and/or use of iterative reconstruction technique. COMPARISON:  Chest radiograph dated 07/15/2022. Chest CT dated 04/08/2004. FINDINGS: Evaluation of this exam is limited in the absence of intravenous contrast. Cardiovascular: There is no cardiomegaly or pericardial effusion. Mild atherosclerotic calcification of the thoracic aorta. No aneurysmal dilatation. The central pulmonary arteries are grossly unremarkable. Mediastinum/Nodes: No hilar or mediastinal adenopathy. The esophagus is grossly unremarkable. No mediastinal fluid collection. Lungs/Pleura: Background of emphysema. There is volume loss in the right middle lobe with diffuse ground-glass density which may represent atelectasis. Infiltrate is less likely. There is narrowing of the right middle lobe bronchus. Linear high attenuating area along the bronchus intermedius adjacent to the takeoff of the right middle lobe bronchus may represent calcification or postsurgical changes. Correlation with surgical history recommended. No obvious mass identified in the right hilar region on this noncontrast CT. CT with IV contrast may provide better evaluation if clinically indicated. There are bibasilar linear atelectasis/scarring. No consolidative changes. There is no pleural effusion or pneumothorax. The central airways are patent. Upper Abdomen: Gallstone. Musculoskeletal: Osteopenia  with degenerative changes of the spine. No acute osseous pathology. IMPRESSION: 1. Narrowed appearance of the right middle lobe bronchus with probable atelectatic changes of the right middle lobe. Correlation with history of prior surgery recommended. CT with IV contrast may provide better evaluation of the right hilum if clinically indicated. 2. Cholelithiasis. 3. Aortic Atherosclerosis (ICD10-I70.0) and Emphysema (ICD10-J43.9). Electronically Signed   By: Anner Crete M.D.   On: 07/15/2022 23:06   DG Chest Portable 1 View  Result Date: 07/15/2022 CLINICAL DATA:  Shortness of breath EXAM: PORTABLE CHEST 1 VIEW COMPARISON:  Chest x-ray 09/13/2021 FINDINGS: Hyperinflation. No consolidation, pneumothorax. There is blunting of the right costophrenic angle. No edema. Basilar atelectasis. Normal cardiopericardial silhouette with  calcified aorta. No edema. Overlapping cardiac leads. IMPRESSION: Hyperinflation. Presumed basilar atelectasis or scar. Blunting of the right costophrenic angle. Tiny effusion versus thickening Electronically Signed   By: Jill Side M.D.   On: 07/15/2022 16:19    Pending Labs Unresulted Labs (From admission, onward)     Start     Ordered   07/15/22 2223  Blood gas, venous  Once,   R        07/15/22 2222   07/15/22 2005  HIV Antibody (routine testing w rflx)  (HIV Antibody (Routine testing w reflex) panel)  Once,   R        07/15/22 2009            Vitals/Pain Today's Vitals   07/15/22 2130 07/15/22 2200 07/15/22 2230 07/15/22 2300  BP: (!) 101/56 (!) 110/57 (!) 109/59 110/61  Pulse:  67  69  Resp: '15 14 15   '$ Temp:      TempSrc:      SpO2:  93%  90%    Isolation Precautions No active isolations  Medications Medications  enoxaparin (LOVENOX) injection 40 mg (has no administration in time range)  nicotine (NICODERM CQ - dosed in mg/24 hours) patch 14 mg (has no administration in time range)  cefTRIAXone (ROCEPHIN) 1 g in sodium chloride 0.9 % 100 mL IVPB  (has no administration in time range)  predniSONE (DELTASONE) tablet 40 mg (has no administration in time range)  mometasone-formoterol (DULERA) 200-5 MCG/ACT inhaler 2 puff (has no administration in time range)  albuterol (PROVENTIL) (2.5 MG/3ML) 0.083% nebulizer solution 2.5 mg (has no administration in time range)  umeclidinium bromide (INCRUSE ELLIPTA) 62.5 MCG/ACT 1 puff (has no administration in time range)  ipratropium-albuterol (DUONEB) 0.5-2.5 (3) MG/3ML nebulizer solution 3 mL (3 mLs Nebulization Given 07/15/22 1714)  methylPREDNISolone sodium succinate (SOLU-MEDROL) 125 mg/2 mL injection 125 mg (125 mg Intravenous Given 07/15/22 2036)  albuterol (PROVENTIL) (2.5 MG/3ML) 0.083% nebulizer solution 5 mg (5 mg Nebulization Given 07/15/22 2036)    Mobility walks     Focused Assessments Pulmonary Assessment Handoff:  Lung sounds: Bilateral Breath Sounds: Expiratory wheezes, Diminished (pt states she has exp whz normally) O2 Device: Nasal Cannula O2 Flow Rate (L/min): (S) 2 L/min (decreased to 1 lpm post neb - no home 02)    R Recommendations: See Admitting Provider Note  Report given to:   Additional Notes:

## 2022-07-15 NOTE — ED Provider Notes (Signed)
[place] AT [facility] Provider Note   CSN: [id number] Arrival date & time: 07/15/22  1534     History  Chief Complaint  Patient presents with   Fatigue    Melody White is a 72 y.o. female.  HPI Patient Melody White with shortness of breath.  Has had for the last 2 days.  Occasional cough.  Has had previous admissions for COPD patient denies having COPD.  Continues to smoke.  Hypoxic at 85 in triage.  Improved on oxygen.  Has had a headache.  Has had some vomiting this morning.  Has previously been on oxygen but does not currently have any oxygen at home.   Past Medical History:  Diagnosis Date   Hypertension    Stroke [facility])    Thyroid disease       Home Medications Prior to Admission medications   Medication Sig Start Date End Date Taking? Authorizing Provider  acetaminophen (TYLENOL) 325 MG tablet Take 1-2 tablets (325-650 mg total) by mouth every 6 (six) hours as needed for mild pain or moderate pain (pain score 1-3 or temp > 100.5). 10/13/20   Dagar, Meredith Staggers, MD  albuterol (PROVENTIL) (2.5 MG/3ML) 0.083% nebulizer solution Take 3 mLs (2.5 mg total) by nebulization every 4 (four) hours as needed for wheezing or shortness of breath. 09/04/21 09/04/22  Roxan Hockey, MD  albuterol (VENTOLIN HFA) 108 (90 Base) MCG/ACT inhaler Inhale 2 puffs into the lungs every 6 (six) hours as needed for wheezing or shortness of breath. 09/04/21   Roxan Hockey, MD  alendronate (FOSAMAX) 70 MG tablet Take 70 mg by mouth once a week. 08/29/21   [provider]  aspirin EC 81 MG tablet Take 1 tablet (81 mg total) by mouth daily with breakfast. 09/04/21 09/04/22  Roxan Hockey, MD  atorvastatin (LIPITOR) 20 MG tablet Take 1 tablet (20 mg total) by mouth daily. 09/05/21   Roxan Hockey, MD  budesonide-formoterol (SYMBICORT) 160-4.5 MCG/ACT inhaler Inhale 2 puffs into the lungs 2 (two) times daily. 09/04/21   Roxan Hockey, MD  calcium carbonate (CALCIUM  600) 1500 (600 Ca) MG TABS tablet Take 1 tablet (1,500 mg total) by mouth 2 (two) times daily with a meal. 10/13/20   Dagar, Meredith Staggers, MD  Cholecalciferol 25 MCG (1000 UT) tablet Take 1 tablet (1,000 Units total) by mouth daily. 10/13/20   Dagar, Meredith Staggers, MD  dextromethorphan-guaiFENesin (MUCINEX DM) 30-600 MG 12hr tablet Take 1 tablet by mouth 2 (two) times daily. 09/04/21   Roxan Hockey, MD  diazepam (VALIUM) 10 MG tablet Take 10 mg by mouth at bedtime as needed for anxiety or sleep. Into the vagina every night as needed for pelvic floor spasms 11/29/18   [provider]  escitalopram (LEXAPRO) 20 MG tablet Take 1 tablet (20 mg total) by mouth daily. 09/04/21   Emokpae, Courage, MD  ipratropium-albuterol (DUONEB) 0.5-2.5 (3) MG/3ML SOLN Take 3 mLs by nebulization 3 (three) times daily. Patient not taking: Reported on 09/13/2021 09/04/21   Roxan Hockey, MD  levothyroxine (SYNTHROID) 75 MCG tablet Take 1 tablet (75 mcg total) by mouth daily. 09/04/21   Roxan Hockey, MD  montelukast (SINGULAIR) 10 MG tablet Take 1 tablet (10 mg total) by mouth at bedtime. 09/04/21   Roxan Hockey, MD  Multiple Vitamin (MULTIVITAMIN) capsule Take 1 capsule by mouth daily.    [provider]  Nebulizers (COMPRESSOR/NEBULIZER) MISC 1 Units by Does not apply route as directed. 09/04/21   Roxan Hockey, MD  Allergies    Amoxicillin, Azithromycin, Clindamycin, Codeine, Fexofenadine, Hydrocodone bit-homatrop mbr, and Sulfamethoxazole    Review of Systems   Review of Systems  Physical Exam Updated Vital Signs BP 127/78   Pulse 86   Temp 98.1 F (36.7 C) (Oral)   Resp 18   SpO2 97%  Physical Exam Vitals and nursing note reviewed.  Eyes:     Pupils: Pupils are equal, round, and reactive to light.  Cardiovascular:     Rate and Rhythm: Regular rhythm.  Pulmonary:     Comments: Diffuse harsh breath sounds. Abdominal:     Tenderness: There is no abdominal tenderness.  Musculoskeletal:      Right lower leg: No edema.     Left lower leg: No edema.  Skin:    General: Skin is warm.     Capillary Refill: Capillary refill takes less than 2 seconds.  Neurological:     Mental Status: She is alert and oriented to person, place, and time.     ED Results / Procedures / Treatments   Labs (all labs ordered are listed, but only abnormal results are displayed) Labs Reviewed  CBC WITH DIFFERENTIAL/PLATELET - Abnormal; Notable for the following components:      Result Value   RBC 5.37 (*)    Hemoglobin 17.0 (*)    HCT 53.2 (*)    All other components within normal limits  RESP PANEL BY RT-PCR (RSV, FLU A&B, COVID)  [id number]  BASIC METABOLIC PANEL  BRAIN NATRIURETIC PEPTIDE    EKG EKG Interpretation  Date/Time:  Saturday July 15 2022 15:44:13 EST Ventricular Rate:  76 PR Interval:  142 QRS Duration: 81 QT Interval:  373 QTC Calculation: 420 R Axis:   -76 Text Interpretation: Sinus rhythm Right atrial enlargement Abnormal R-wave progression, early transition Inferior infarct, old Consider anterior infarct Confirmed by Davonna Belling [phone]) on 07/15/2022 4:22:31 PM  Radiology DG Chest Portable 1 View  Result Date: 07/15/2022 CLINICAL DATA:  Shortness of breath EXAM: PORTABLE CHEST 1 VIEW COMPARISON:  Chest x-ray 09/13/2021 FINDINGS: Hyperinflation. No consolidation, pneumothorax. There is blunting of the right costophrenic angle. No edema. Basilar atelectasis. Normal cardiopericardial silhouette with calcified aorta. No edema. Overlapping cardiac leads. IMPRESSION: Hyperinflation. Presumed basilar atelectasis or scar. Blunting of the right costophrenic angle. Tiny effusion versus thickening Electronically Signed   By: Jill Side M.D.   On: 07/15/2022 16:19    Procedures Procedures    Medications Ordered in ED Medications  methylPREDNISolone sodium succinate (SOLU-MEDROL) 125 mg/2 mL injection 125 mg (has no administration in time range)  albuterol (PROVENTIL) (2.5  MG/3ML) 0.083% nebulizer solution 5 mg (has no administration in time range)  ipratropium-albuterol (DUONEB) 0.5-2.5 (3) MG/3ML nebulizer solution 3 mL (3 mLs Nebulization Given 07/15/22 1714)    ED Course/ Medical Decision Making/ A&P                             Medical Decision Making Amount and/or Complexity of Data Reviewed Labs: ordered. Radiology: ordered.  Risk Prescription drug management.   Patient is shortness of breath.  Previous history of oxygen dependence.  Does not currently have oxygen.  Is hypoxic on room air.  Chest x-ray reassuring.  No pneumonia seen.  Negative flu COVID and RSV testing.  Breathing treatment briefly brought her oxygen requirement down to 1 L but then became more short of breath again.  I think most likely COPD flare.  Will require admission to  the hospital.  Will discuss with hospitalist.  Solu-Medrol has been given and will give more breathing treatments. Doubt CHF.  Doubt pulmonary embolism.        Final Clinical Impression(s) / ED Diagnoses Final diagnoses:  Hypoxia    Rx / DC Orders ED Discharge Orders     None         Davonna Belling, MD 07/15/22 [phone]

## 2022-07-15 NOTE — Assessment & Plan Note (Addendum)
COPD pathway Prednisone (got solumedrol in ED) PRN SABA Scheduled LABA/LAMA and INH steroid Rocephin Needs to quit smoking! Get VBG Get CT chest non-contrast

## 2022-07-15 NOTE — ED Notes (Signed)
Turned patient's oxygen down from 4L to 2L. Patient tolerating well.

## 2022-07-15 NOTE — Progress Notes (Signed)
PT states and caregiver at bedside confirms that PT has expiratory wheeze most often and that PT always has congested cough. Pt was 97% on 2 LPM RT decreased to 1 lpm post neb- no home usage.

## 2022-07-15 NOTE — H&P (Signed)
History and Physical    Patient: Melody White [id number] DOB: November 26, 1950 DOA: 07/15/2022 DOS: the patient was seen and examined on 07/15/2022 PCP: Aletha Halim., PA-C  Patient coming from: Home  Chief Complaint:  Chief Complaint  Patient presents with   Fatigue   HPI: Melody White is a 72 y.o. female with medical history significant of HTN, stroke.  Pt with h/o COPD admissions in 2022 and 2023 though she denies having COPD to [place].  Pt continues to smoke.  Pt presents to ED for 2 day h/o SOB, occasional cough.  Hypoxic to 85 on RA in triage.  Not currently on O2 at home.    Review of Systems: As mentioned in the history of present illness. All other systems reviewed and are negative. Past Medical History:  Diagnosis Date   Hypertension    Stroke [facility])    Thyroid disease    Past Surgical History:  Procedure Laterality Date   ABDOMINAL HYSTERECTOMY     bowel obstruction     BREAST EXCISIONAL BIOPSY Right 20+ yrs ago   benign   INTRAMEDULLARY (IM) NAIL INTERTROCHANTERIC Left 10/11/2020   Procedure: INTRAMEDULLARY (IM) NAIL INTERTROCHANTRIC;  Surgeon: Shona Needles, MD;  Location: [place];  Service: Orthopedics;  Laterality: Left;   SHOULDER SURGERY     Social History:  reports that she has been smoking cigarettes. She has a 25.00 pack-year smoking history. She has never used smokeless tobacco. She reports that she does not currently use alcohol. She reports that she does not currently use drugs.  Allergies  Allergen Reactions   Amoxicillin Other (See Comments)    other    Azithromycin Itching   Clindamycin Other (See Comments)    Funny feeling other    Codeine Itching and Other (See Comments)    other    Fexofenadine Other (See Comments)    Funny feeling other    Hydrocodone Bit-Homatrop Mbr Itching   Sulfamethoxazole Nausea Only and Other (See Comments)    other     Family History  Problem Relation Age of Onset   Arthritis Mother     Prior to  Admission medications   Medication Sig Start Date End Date Taking? Authorizing Provider  acetaminophen (TYLENOL) 325 MG tablet Take 1-2 tablets (325-650 mg total) by mouth every 6 (six) hours as needed for mild pain or moderate pain (pain score 1-3 or temp > 100.5). 10/13/20   Dagar, Meredith Staggers, MD  albuterol (PROVENTIL) (2.5 MG/3ML) 0.083% nebulizer solution Take 3 mLs (2.5 mg total) by nebulization every 4 (four) hours as needed for wheezing or shortness of breath. 09/04/21 09/04/22  Roxan Hockey, MD  albuterol (VENTOLIN HFA) 108 (90 Base) MCG/ACT inhaler Inhale 2 puffs into the lungs every 6 (six) hours as needed for wheezing or shortness of breath. 09/04/21   Roxan Hockey, MD  alendronate (FOSAMAX) 70 MG tablet Take 70 mg by mouth once a week. 08/29/21   [provider]  aspirin EC 81 MG tablet Take 1 tablet (81 mg total) by mouth daily with breakfast. 09/04/21 09/04/22  Roxan Hockey, MD  atorvastatin (LIPITOR) 20 MG tablet Take 1 tablet (20 mg total) by mouth daily. 09/05/21   Roxan Hockey, MD  budesonide-formoterol (SYMBICORT) 160-4.5 MCG/ACT inhaler Inhale 2 puffs into the lungs 2 (two) times daily. 09/04/21   Roxan Hockey, MD  calcium carbonate (CALCIUM 600) 1500 (600 Ca) MG TABS tablet Take 1 tablet (1,500 mg total) by mouth 2 (two) times daily with a meal.  10/13/20   Dagar, Meredith Staggers, MD  Cholecalciferol 25 MCG (1000 UT) tablet Take 1 tablet (1,000 Units total) by mouth daily. 10/13/20   Dagar, Meredith Staggers, MD  dextromethorphan-guaiFENesin (MUCINEX DM) 30-600 MG 12hr tablet Take 1 tablet by mouth 2 (two) times daily. 09/04/21   Roxan Hockey, MD  diazepam (VALIUM) 10 MG tablet Take 10 mg by mouth at bedtime as needed for anxiety or sleep. Into the vagina every night as needed for pelvic floor spasms 11/29/18   [provider]  escitalopram (LEXAPRO) 20 MG tablet Take 1 tablet (20 mg total) by mouth daily. 09/04/21   Emokpae, Courage, MD  ipratropium-albuterol (DUONEB) 0.5-2.5 (3)  MG/3ML SOLN Take 3 mLs by nebulization 3 (three) times daily. Patient not taking: Reported on 09/13/2021 09/04/21   Roxan Hockey, MD  levothyroxine (SYNTHROID) 75 MCG tablet Take 1 tablet (75 mcg total) by mouth daily. 09/04/21   Roxan Hockey, MD  montelukast (SINGULAIR) 10 MG tablet Take 1 tablet (10 mg total) by mouth at bedtime. 09/04/21   Roxan Hockey, MD  Multiple Vitamin (MULTIVITAMIN) capsule Take 1 capsule by mouth daily.    [provider]  Nebulizers (COMPRESSOR/NEBULIZER) MISC 1 Units by Does not apply route as directed. 09/04/21   Roxan Hockey, MD    Physical Exam: Vitals:   07/15/22 1900 07/15/22 1930 07/15/22 1933 07/15/22 2000  BP: 124/73 110/65  105/61  Pulse: 69 66  68  Resp: '13 16  15  '$ Temp:   97.8 F (36.6 C)   TempSrc:   Oral   SpO2: 93% 96%  94%   Constitutional: NAD, calm, comfortable Respiratory: Diffuse wheezing Cardiovascular: Regular rate and rhythm, no murmurs / rubs / gallops. No extremity edema. 2+ pedal pulses. No carotid bruits.  Abdomen: no tenderness, no masses palpated. No hepatosplenomegaly. Bowel sounds positive.  Neurologic: CN 2-12 grossly intact. Sensation intact, DTR normal. Strength 5/5 in all 4.  Psychiatric: Normal judgment and insight. Alert and oriented x 3. Normal mood.   Data Reviewed:        Latest Ref Rng & Units 07/15/2022    4:27 PM 09/13/2021   12:44 PM 09/02/2021    5:52 AM  CBC  WBC 4.0 - 10.5 K/uL 5.8  10.1  12.9   Hemoglobin 12.0 - 15.0 g/dL 17.0  14.6  13.3   Hematocrit 36.0 - 46.0 % 53.2  47.0  41.8   Platelets 150 - 400 K/uL 189  258  256       Latest Ref Rng & Units 07/15/2022    4:27 PM 09/13/2021   12:44 PM 09/02/2021    5:52 AM  CMP  Glucose 70 - 99 mg/dL 90  100  124   BUN 8 - 23 mg/dL '19  15  26   '$ Creatinine 0.44 - 1.00 mg/dL 0.75  0.76  0.55   Sodium 135 - 145 mmol/L 138  135  138   Potassium 3.5 - 5.1 mmol/L 4.3  3.9  4.4   [place] 98 - 111 mmol/L 103  102  102   CO2 22 - 32 mmol/L '27  25   28   '$ Calcium 8.9 - 10.3 mg/dL 9.0  8.9  9.2   Total Protein 6.5 - 8.1 g/dL  6.6    Total Bilirubin 0.3 - 1.2 mg/dL  0.5    Alkaline Phos 38 - 126 U/L  76    AST 15 - 41 U/L  17    ALT 0 - 44 U/L  15  COVID, FLU, RSV = neg  BNP 24  CXR: IMPRESSION: Hyperinflation. Presumed basilar atelectasis or scar. Blunting of the right costophrenic angle. Tiny effusion versus thickening  Assessment and Plan: * COPD exacerbation (HCC) COPD pathway Prednisone (got solumedrol in ED) PRN SABA Scheduled LABA/LAMA and INH steroid Rocephin Needs to quit smoking! Get VBG Get CT chest non-contrast  Acute respiratory failure with hypoxia (HCC) Due to COPD exacerbation O2 via [place] Cont pulse ox  Tobacco use Continues to smoke Nicotine patch      Advance Care Planning:   Code Status: Full Code  Consults: None  Family Communication: No family in room  Severity of Illness: The appropriate patient status for this patient is OBSERVATION. Observation status is judged to be reasonable and necessary in order to provide the required intensity of service to ensure the patient's safety. The patient's presenting symptoms, physical exam findings, and initial radiographic and laboratory data in the context of their medical condition is felt to place them at decreased risk for further clinical deterioration. Furthermore, it is anticipated that the patient will be medically stable for discharge from the hospital within 2 midnights of admission.   Author: Etta Quill., DO 07/15/2022 8:40 PM  For on call review [email or web address].

## 2022-07-15 NOTE — ED Notes (Signed)
MD at bedside to update patient and family on plan.

## 2022-07-15 NOTE — Assessment & Plan Note (Signed)
Continues to smoke Nicotine patch

## 2022-07-16 ENCOUNTER — Other Ambulatory Visit: Payer: Self-pay

## 2022-07-16 ENCOUNTER — Observation Stay (HOSPITAL_COMMUNITY): Payer: Medicare Other

## 2022-07-16 DIAGNOSIS — J441 Chronic obstructive pulmonary disease with (acute) exacerbation: Secondary | ICD-10-CM | POA: Diagnosis not present

## 2022-07-16 LAB — HIV ANTIBODY (ROUTINE TESTING W REFLEX): HIV Screen 4th Generation wRfx: NONREACTIVE

## 2022-07-16 LAB — BLOOD GAS, VENOUS
Acid-Base Excess: 8.4 mmol/L — ABNORMAL HIGH (ref 0.0–2.0)
Bicarbonate: 37.9 mmol/L — ABNORMAL HIGH (ref 20.0–28.0)
Drawn by: 66895
O2 Saturation: 43.8 %
Patient temperature: 36.4
pCO2, Ven: 75 mmHg (ref 44–60)
pH, Ven: 7.31 (ref 7.25–7.43)
pO2, Ven: <31 mmHg — CL (ref 32–45)

## 2022-07-16 MED ORDER — SODIUM CHLORIDE (PF) 0.9 % IJ SOLN
INTRAMUSCULAR | Status: AC
  Filled 2022-07-16: qty 50

## 2022-07-16 MED ORDER — LEVOTHYROXINE SODIUM 50 MCG PO TABS
75.0000 ug | ORAL_TABLET | Freq: Every day | ORAL | Status: DC
  Administered 2022-07-17 – 2022-07-21 (×5): 75 ug via ORAL
  Filled 2022-07-16 (×5): qty 1

## 2022-07-16 MED ORDER — METRONIDAZOLE 500 MG/100ML IV SOLN
500.0000 mg | Freq: Two times a day (BID) | INTRAVENOUS | Status: DC
  Administered 2022-07-16 – 2022-07-19 (×7): 500 mg via INTRAVENOUS
  Filled 2022-07-16 (×9): qty 100

## 2022-07-16 MED ORDER — IOHEXOL 350 MG/ML SOLN
75.0000 mL | Freq: Once | INTRAVENOUS | Status: AC | PRN
  Administered 2022-07-16: 75 mL via INTRAVENOUS

## 2022-07-16 MED ORDER — LACTATED RINGERS IV SOLN: INTRAVENOUS | Status: DC

## 2022-07-16 MED ORDER — ENSURE ENLIVE PO LIQD
237.0000 mL | Freq: Two times a day (BID) | ORAL | Status: DC
  Administered 2022-07-16 – 2022-07-21 (×10): 237 mL via ORAL

## 2022-07-16 MED ORDER — MONTELUKAST SODIUM 10 MG PO TABS
10.0000 mg | ORAL_TABLET | Freq: Every day | ORAL | Status: DC
  Administered 2022-07-16 – 2022-07-20 (×5): 10 mg via ORAL
  Filled 2022-07-16 (×5): qty 1

## 2022-07-16 MED ORDER — SODIUM CHLORIDE 0.9 % IV SOLN
1.0000 g | INTRAVENOUS | Status: DC
  Administered 2022-07-16 – 2022-07-19 (×4): 1 g via INTRAVENOUS
  Filled 2022-07-16 (×4): qty 10

## 2022-07-16 MED ORDER — ATORVASTATIN CALCIUM 40 MG PO TABS
20.0000 mg | ORAL_TABLET | Freq: Every day | ORAL | Status: DC
  Administered 2022-07-16 – 2022-07-21 (×6): 20 mg via ORAL
  Filled 2022-07-16 (×6): qty 1

## 2022-07-16 MED ORDER — GUAIFENESIN ER 600 MG PO TB12
600.0000 mg | ORAL_TABLET | Freq: Two times a day (BID) | ORAL | Status: DC
  Administered 2022-07-16 – 2022-07-21 (×10): 600 mg via ORAL
  Filled 2022-07-16 (×10): qty 1

## 2022-07-16 MED ORDER — UMECLIDINIUM BROMIDE 62.5 MCG/ACT IN AEPB
1.0000 | INHALATION_SPRAY | Freq: Every day | RESPIRATORY_TRACT | Status: DC
  Administered 2022-07-16 – 2022-07-21 (×6): 1 via RESPIRATORY_TRACT
  Filled 2022-07-16: qty 7

## 2022-07-16 NOTE — Progress Notes (Addendum)
PROGRESS NOTE    Melody White  [id number] DOB: 1951-02-14 DOA: 07/15/2022 PCP: Aletha Halim., PA-C     Brief Narrative:   H/o hypothyroidism, TIA, COPD, current smoker presented to the hospital due to weakness, found to have hypoxia  Patient appears to have memory issues not able to provide reliable history  Per Family report: patient does not eat, as every time she eat , "food comes up", she has a chronic cough, she lost a lot weight.   Husband states that she has been laying around for the last 4days, not getting up, family brought her to urgent care, tests done at urgent care unremarkable, but she does appear sick, family are advised to bring the hospital to the ED  In the ED she was found to have hypoxia, thus admitted  Subjective:    Her  main complaints seem is more around being weak, reports got to be too weak, she could not walk  Reports still have headache but has improved Appear to have impaired memory, not able to provide reliable history  She is on 3ltier o2 supplement, She currently denies feeling sob, she does have congested cough, reports cough is just a little worse that usual, she thinks cough is related to ill fitting denture,   Per OT " patient was noted to drop O2 to 84% on RA with ambulation in hallway with over 1 min seated rest break to reath back up to 90% on RA. patient was able to maintain on 2L/min standing. 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, reports globus sensation, intermittent dysphagia, "it stops ,won't go down, denies odynophagia, reports was told need to have esophagus stretched but she is not interested in doing it   She appears thin but reports weight has been stable over the years    Assessment & Plan:  Principal Problem:   COPD exacerbation ([place]) Active Problems:   Acute respiratory failure with hypoxia ([place])   Tobacco use   Hypothyroidism    Assessment and Plan:   Acute hypoxic  respiratory failure, currently getting treatment for COPD exacerbation Also concern for aspiration pneumonia due to dysphagia Antibiotic changed to rocephin/flagyl (h/o amoxicillin allergy), will do DG esophagus Will get CTA chest to rule out PE , also evaluate "Narrowed appearance of the right middle lobe bronchus with probable atelectatic changes of the right middle lobe" seen on CT without contrast    H/o TIA: Continue aspirin, Lipitor  Hypothyroidism, resume Synthroid, check TSH  Cholelithiasis, incidental finding on CT scan, denies abdominal pain, will check lft  Aortic Atherosclerosis, sudden finding on CT scan, continue aspirin and Lipitor  Tobacco use: Smoking cessation education provided, she does not appear interested in quitting smoking  FTT: Progressive weakness, weight loss, poor oral intake, PT eval   Appear thin, family reports poor oral intake and weight loss,  awaiting weight to be measured to calculation BMI, order placed  Nutrition Status: Nutrition Problem: Increased nutrient needs Etiology: chronic illness Signs/Symptoms: estimated needs Interventions: Ensure Enlive (each supplement provides 350kcal and 20 grams of protein) She appears is dehydrated with poor oral intake, start hydration  .        I have Reviewed nursing notes, Vitals, pain scores, I/o's, Lab results and  imaging results since pt's last encounter, details please see discussion above  I ordered the following labs:  Unresulted Labs (From admission, onward)     Start     Ordered  07/17/22 0500  CBC with Differential/Platelet  Tomorrow morning,   R        07/16/22 2025   07/17/22 [id number]  Basic metabolic panel  Tomorrow morning,   R        07/16/22 2025   07/17/22 0500  Magnesium  Tomorrow morning,   R        07/16/22 2025   07/17/22 0500  Phosphorus  Tomorrow morning,   R        07/16/22 2025   07/17/22 0500  D-dimer, quantitative  Tomorrow morning,   R        07/16/22 2025   07/17/22  0500  Vitamin B12  Tomorrow morning,   R        07/16/22 2034   07/17/22 0500  TSH  Tomorrow morning,   R        07/16/22 2043   07/17/22 0500  Hepatic function panel  Tomorrow morning,   R        07/16/22 2047   07/16/22 2026  Expectorated Sputum Assessment w Gram Stain, Rflx to Resp Cult  Once,   R        07/16/22 2025             DVT prophylaxis: enoxaparin (LOVENOX) injection 40 mg Start: 07/15/22 2200   Code Status:   Code Status: Full Code  Family Communication: sister over the phone with permission  Disposition:   Status is: Observation   Dispo: The patient is from: home              Anticipated d/c is to: home               Anticipated d/c date is: TBD, f/u on dg esophagus, CTA chest, need to wean oxygen   Antimicrobials:    Anti-infectives (From admission, onward)    Start     Dose/Rate Route Frequency Ordered Stop   07/15/22 2200  cefTRIAXone (ROCEPHIN) 1 g in sodium chloride 0.9 % 100 mL IVPB  Status:  Discontinued        1 g 200 mL/hr over 30 Minutes Intravenous Every 24 hours 07/15/22 2009 07/16/22 2033          Objective: Vitals:   07/16/22 0728 07/16/22 1258 07/16/22 1920 07/16/22 2045  BP: 122/74 117/74  (!) 105/58  Pulse: 75 80  78  Resp: '16 16  13  '$ Temp: 98.1 F (36.7 C) 97.9 F (36.6 C)  98.4 F (36.9 C)  TempSrc:    Oral  SpO2: 90% 95% 92% 94%    Intake/Output Summary (Last 24 hours) at 07/16/2022 2047 Last data filed at 07/16/2022 0600 Gross per 24 hour  Intake 100 ml  Output --  Net 100 ml   There were no vitals filed for this visit.  Examination:  General exam: Very weak, chronic ill-appearing, thin, intermittent congested cough, reports chronic  Respiratory system: Diminished, prolonged expiratory phase ,respiratory effort normal. Cardiovascular system:  RRR.  Gastrointestinal system: Abdomen is nondistended, soft and nontender.  Normal bowel sounds heard. Central nervous system: Alert and orientedx3. No focal neurological  deficits. Does appear to have impaired memory, not a reliable historian Extremities:  no edema Skin: No rashes, lesions or ulcers Psychiatry: Calm and cooperative, does has impaired memory    Data Reviewed: I have personally reviewed  labs and visualized  imaging studies since the last encounter and formulate the plan        Scheduled Meds:  atorvastatin  20 mg Oral Daily   enoxaparin (LOVENOX) injection  40 mg Subcutaneous Q24H   feeding supplement  237 mL Oral BID BM   guaiFENesin  600 mg Oral BID   [START ON 07/17/2022] levothyroxine  75 mcg Oral Daily   mometasone-formoterol  2 puff Inhalation BID   montelukast  10 mg Oral QHS   nicotine  14 mg Transdermal Daily   predniSONE  40 mg Oral Q breakfast   umeclidinium bromide  1 puff Inhalation Daily   Continuous Infusions:     LOS: 0 days   Time spent:  80mns  FFlorencia Reasons MD PhD FACP [facility]  Available via Epic secure chat 7am-7pm for nonurgent issues Please page for urgent issues To page the attending provider between 7A-7P or the covering provider during after hours 7P-7A, please log into the web site [email or web address] and access using universal [place] password for that web site. If you do not have the password, please call the hospital operator.    07/16/2022, 8:47 PM

## 2022-07-16 NOTE — Progress Notes (Signed)
Date and time results received: 07/16/22 0045  Test: PCO2 =75, PO2 <31  Critical Value:   Name of Provider Notified: Gershon Cull, NP  No new orders    Pt is drowsy but will arouse and answer questions appropriately.

## 2022-07-16 NOTE — Evaluation (Signed)
Physical Therapy Evaluation Patient Details Name: Melody White MRN: [id number] DOB: 17-Nov-1950 Today's Date: 07/16/2022  History of Present Illness  Patient is a 72 year old female who presented with 2 day history of shortness of breath with O2 noted to be 85% on room air. Patient was found to have COPD exacerbation.  PMH: L IM nail 5/22, HTN, thyroid disease, stroke  Clinical Impression  Pt admitted as above and presenting with functional mobility limitations 2* generalized weakness, mild balance deficits and decreased endurance (Pt currently requiring O2 but not at baseline).  Pt very cooperative and should progress to dc home with family assist.     Recommendations for follow up therapy are one component of a multi-disciplinary discharge planning process, led by the attending physician.  Recommendations may be updated based on patient status, additional functional criteria and insurance authorization.  Follow Up Recommendations No PT follow up      Assistance Recommended at Discharge PRN  Patient can return home with the following  A little help with walking and/or transfers;Assistance with cooking/housework;Assist for transportation;Help with stairs or ramp for entrance    Equipment Recommendations None recommended by PT  Recommendations for Other Services       Functional Status Assessment Patient has had a recent decline in their functional status and demonstrates the ability to make significant improvements in function in a reasonable and predictable amount of time.     Precautions / Restrictions Precautions Precautions: Other (comment) Precaution Comments: monitor O2 Restrictions Weight Bearing Restrictions: No      Mobility  Bed Mobility Overal bed mobility: Needs Assistance Bed Mobility: Supine to Sit, Sit to Supine     Supine to sit: Supervision Sit to supine: Supervision   General bed mobility comments: No physical assist    Transfers Overall transfer  level: Needs assistance Equipment used: Rolling walker (2 wheels) Transfers: Sit to/from Stand Sit to Stand: Min guard           General transfer comment: Steady assist only - pt states "my legs feel like rubber"    Ambulation/Gait Ambulation/Gait assistance: Min guard Gait Distance (Feet): 350 Feet Assistive device: Rolling walker (2 wheels) Gait Pattern/deviations: Step-to pattern, Step-through pattern, Shuffle, Trunk flexed Gait velocity: mod pace     General Gait Details: Steady assist only; pt requests use of RW "my legs feel like rubber" min cues for posture and position from [employer]            Wheelchair Mobility    Modified Rankin (Stroke Patients Only)       Balance Overall balance assessment: Mild deficits observed, not formally tested                                           Pertinent Vitals/Pain Pain Assessment Pain Assessment: 0-10 Pain Score: 1  Pain Location: discomfort on IV site on back of L hand Pain Descriptors / Indicators: Discomfort    Home Living Family/patient expects to be discharged to:: Private residence Living Arrangements: Spouse/significant other Available Help at Discharge: Family;Available 24 hours/day Type of Home: House Home Access: Stairs to enter Entrance Stairs-Rails: None Entrance Stairs-Number of Steps: 2   Home Layout: One level Home Equipment: Shower seat;Grab bars - tub/shower;Rolling Walker (2 wheels);Cane - single point      Prior Function Prior Level of Function : Independent/Modified Independent  Hand Dominance   Dominant Hand: Right    Extremity/Trunk Assessment   Upper Extremity Assessment Upper Extremity Assessment: Overall WFL for tasks assessed    Lower Extremity Assessment Lower Extremity Assessment: Generalized weakness    Cervical / Trunk Assessment Cervical / Trunk Assessment: Normal  Communication   Communication: No difficulties   Cognition Arousal/Alertness: Awake/alert Behavior During Therapy: WFL for tasks assessed/performed Overall Cognitive Status: Within Functional Limits for tasks assessed                                          General Comments      Exercises     Assessment/Plan    PT Assessment Patient needs continued PT services  PT Problem List Decreased balance;Decreased mobility;Decreased activity tolerance       PT Treatment Interventions DME instruction;Gait training;Stair training;Functional mobility training;Therapeutic activities;Balance training;Patient/family education    PT Goals (Current goals can be found in the Care Plan section)  Acute Rehab PT Goals Patient Stated Goal: Regain IND and dc home PT Goal Formulation: With patient Time For Goal Achievement: 07/30/22 Potential to Achieve Goals: Good    Frequency Min 3X/week     Co-evaluation PT/OT/SLP Co-Evaluation/Treatment: Yes Reason for Co-Treatment: To address functional/ADL transfers PT goals addressed during session: Mobility/safety with mobility OT goals addressed during session: ADL's and self-care       AM-PAC PT "6 Clicks" Mobility  Outcome Measure Help needed turning from your back to your side while in a flat bed without using bedrails?: None Help needed moving from lying on your back to sitting on the side of a flat bed without using bedrails?: None Help needed moving to and from a bed to a chair (including a wheelchair)?: A Little Help needed standing up from a chair using your arms (e.g., wheelchair or bedside chair)?: A Little Help needed to walk in hospital room?: A Little Help needed climbing 3-5 steps with a railing? : A Lot 6 Click Score: 19    End of Session Equipment Utilized During Treatment: Gait belt;Oxygen Activity Tolerance: Patient tolerated treatment well Patient left: in chair;with call bell/phone within reach;with chair alarm set Nurse Communication: Mobility  status PT Visit Diagnosis: Unsteadiness on feet (R26.81);Difficulty in walking, not elsewhere classified (R26.2);Pain    Time: [id number] PT Time Calculation (min) (ACUTE ONLY): 18 min   Charges:   PT Evaluation $PT Eval Low Complexity: 1 Low          [place] Pager [phone] Office [phone]   [facility] 07/16/2022, 3:54 PM

## 2022-07-16 NOTE — Progress Notes (Signed)
Initial Nutrition Assessment  INTERVENTION:   -Needs updated weight for admission, last recorded 03/17/22.  -Ensure Plus High Protein po BID, each supplement provides 350 kcal and 20 grams of protein.   NUTRITION DIAGNOSIS:   Increased nutrient needs related to chronic illness as evidenced by estimated needs.  GOAL:   Patient will meet greater than or equal to 90% of their needs  MONITOR:   PO intake, Supplement acceptance, Labs, Weight trends, I & O's  REASON FOR ASSESSMENT:   Consult Assessment of nutrition requirement/status  ASSESSMENT:   72 y.o. female with medical history significant of HTN, stroke.     Pt with h/o COPD admissions in 2022 and 2023 though she denies having COPD to [place].  Pt continues to smoke.     Pt presents to ED for 2 day h/o SOB, occasional cough.  Patient admitted with SOB for the past 2 days.  No PO documented at this time. Will monitor. Given increased needs from COPD, will order Ensure supplements at this time.  Per review of weight records, pt weighed ~111 lbs on 03/17/22.  Medications reviewed.  Labs reviewed.  NUTRITION - FOCUSED PHYSICAL EXAM:  Unable to complete, RD working remotely.   Diet Order:   Diet Order             Diet Heart Room service appropriate? Yes; Fluid consistency: Thin  Diet effective now                   EDUCATION NEEDS:   Not appropriate for education at this time  Skin:  Skin Assessment: Reviewed RN Assessment  Last BM:  3/2  Height:   Ht Readings from Last 1 Encounters:  09/13/21 '5\' 6"'$  (1.676 m)    Weight:   Wt Readings from Last 1 Encounters:  09/13/21 50.8 kg    BMI:  There is no height or weight on file to calculate BMI.  Estimated Nutritional Needs:   Kcal:  1700-1900  Protein:  85-95g  Fluid:  1.9L/day  Clayton Bibles, MS, RD, LDN Inpatient Clinical Dietitian Contact information available via Amion

## 2022-07-16 NOTE — Evaluation (Signed)
Occupational Therapy Evaluation Patient Details Name: Melody White MRN: [id number] DOB: Mar 01, 1951 Today's Date: 07/16/2022   History of Present Illness Patient is a 72 year old female who presented with 2 day history of shortness of breath with O2 noted to be 85% on room air. Patient was found to have COPD exacerbation.  PMH: L IM nail 5/22, HTN, thyroid disease, stroke   Clinical Impression   Patient is a 72 year old female who was admitted for above. Patient was living at home independently with husband prior level. Currently, patient is min guard for ADL tasks with increased time and need for supplemental O2. Patient would benefit from continued skilled OT services  to increase functional activity tolerance, and education/training on O2 cord management to reduce falls risk in next level of care.      Recommendations for follow up therapy are one component of a multi-disciplinary discharge planning process, led by the attending physician.  Recommendations may be updated based on patient status, additional functional criteria and insurance authorization.   Follow Up Recommendations  No OT follow up     Assistance Recommended at Discharge Intermittent Supervision/Assistance  Patient can return home with the following Direct supervision/assist for medications management;Direct supervision/assist for financial management;Assistance with cooking/housework;Assist for transportation    Functional Status Assessment  Patient has had a recent decline in their functional status and demonstrates the ability to make significant improvements in function in a reasonable and predictable amount of time.  Equipment Recommendations  None recommended by OT    Recommendations for Other Services       Precautions / Restrictions Precautions Precautions: Other (comment) Precaution Comments: monitor O2 Restrictions Weight Bearing Restrictions: No      Mobility Bed Mobility Overal bed mobility:  Needs Assistance Bed Mobility: Supine to Sit, Sit to Supine     Supine to sit: Supervision Sit to supine: Supervision                   Balance Overall balance assessment: No apparent balance deficits (not formally assessed)           ADL either performed or assessed with clinical judgement   ADL Overall ADL's : Needs assistance/impaired Eating/Feeding: Modified independent;Sitting   Grooming: Wash/dry face;Wash/dry hands;Oral care;Supervision/safety;Standing Grooming Details (indicate cue type and reason): at sink Upper Body Bathing: Set up;Sitting   Lower Body Bathing: Set up;Sitting/lateral leans Lower Body Bathing Details (indicate cue type and reason): EOB Upper Body Dressing : Set up;Sitting   Lower Body Dressing: Set up;Sitting/lateral leans   Toilet Transfer: Supervision/safety;Ambulation;Rolling walker (2 wheels) Toilet Transfer Details (indicate cue type and reason): patient was noted to drop O2 to 84% on RA with ambulation in hallway with over 1 min seated rest break to reath back up to 90% on RA. patient was able to maintain on 2L/min standing. Toileting- Water quality scientist and Hygiene: Min guard;Sit to/from stand               Vision   Vision Assessment?: No apparent visual deficits            Pertinent Vitals/Pain Pain Assessment Pain Assessment: 0-10 Pain Score: 1  Pain Location: discomfort on IV site on back of L hand Pain Descriptors / Indicators: Discomfort        Extremity/Trunk Assessment Upper Extremity Assessment Upper Extremity Assessment: Overall WFL for tasks assessed   Lower Extremity Assessment Lower Extremity Assessment: Defer to PT evaluation   Cervical / Trunk Assessment Cervical / Trunk Assessment:  Normal      Cognition Arousal/Alertness: Awake/alert Behavior During Therapy: WFL for tasks assessed/performed Overall Cognitive Status: Within Functional Limits for tasks assessed                       Home Living Family/patient expects to be discharged to:: Private residence Living Arrangements: Spouse/significant other Available Help at Discharge: Family;Available 24 hours/day Type of Home: House Home Access: Stairs to enter [employer] of Steps: 2 Entrance Stairs-Rails: None Home Layout: One level     Bathroom Shower/Tub: Walk-in shower         Home Equipment: Shower seat;Grab bars - tub/shower;Rolling Environmental consultant (2 wheels);Cane - single point          Prior Functioning/Environment Prior Level of Function : Independent/Modified Independent                        OT Problem List: Decreased activity tolerance;Decreased safety awareness;Decreased knowledge of use of DME or AE;Impaired balance (sitting and/or standing)      OT Treatment/Interventions: Self-care/ADL training;Therapeutic exercise;Therapeutic activities;DME and/or AE instruction;Patient/family education    OT Goals(Current goals can be found in the care plan section) Acute Rehab OT Goals Patient Stated Goal: to go home soon OT Goal Formulation: With patient Time For Goal Achievement: 07/30/22 Potential to Achieve Goals: Fair  OT Frequency: Min 2X/week    Co-evaluation PT/OT/SLP Co-Evaluation/Treatment: Yes Reason for Co-Treatment: To address functional/ADL transfers PT goals addressed during session: Mobility/safety with mobility OT goals addressed during session: ADL's and self-care      AM-PAC OT "6 Clicks" Daily Activity     Outcome Measure Help from another person eating meals?: None Help from another person taking care of personal grooming?: A Little Help from another person toileting, which includes using toliet, bedpan, or urinal?: A Little Help from another person bathing (including washing, rinsing, drying)?: A Little Help from another person to put on and taking off regular upper body clothing?: A Little Help from another person to put on and taking off regular lower  body clothing?: A Little 6 Click Score: 19   End of Session Equipment Utilized During Treatment: Rolling walker (2 wheels) Nurse Communication: Other (comment) (ok to participate in session)  Activity Tolerance: Patient tolerated treatment well Patient left: in chair;with call bell/phone within reach  OT Visit Diagnosis: Unsteadiness on feet (R26.81);Other abnormalities of gait and mobility (R26.89)                Time: [id number] OT Time Calculation (min): 20 min Charges:  OT General Charges $OT Visit: 1 Visit OT Evaluation $OT Eval Low Complexity: 1 Low  Levon Boettcher OTR/L, MS Acute Rehabilitation Department Office# [phone]   Willa Rough 07/16/2022, 3:20 PM

## 2022-07-17 ENCOUNTER — Observation Stay (HOSPITAL_COMMUNITY): Payer: Medicare Other

## 2022-07-17 DIAGNOSIS — I700 Atherosclerosis of aorta: Secondary | ICD-10-CM | POA: Diagnosis present

## 2022-07-17 DIAGNOSIS — I110 Hypertensive heart disease with heart failure: Secondary | ICD-10-CM | POA: Diagnosis present

## 2022-07-17 DIAGNOSIS — Z882 Allergy status to sulfonamides status: Secondary | ICD-10-CM | POA: Diagnosis not present

## 2022-07-17 DIAGNOSIS — Z888 Allergy status to other drugs, medicaments and biological substances status: Secondary | ICD-10-CM | POA: Diagnosis not present

## 2022-07-17 DIAGNOSIS — E860 Dehydration: Secondary | ICD-10-CM | POA: Diagnosis present

## 2022-07-17 DIAGNOSIS — Z880 Allergy status to penicillin: Secondary | ICD-10-CM | POA: Diagnosis not present

## 2022-07-17 DIAGNOSIS — Z8261 Family history of arthritis: Secondary | ICD-10-CM | POA: Diagnosis not present

## 2022-07-17 DIAGNOSIS — K8020 Calculus of gallbladder without cholecystitis without obstruction: Secondary | ICD-10-CM | POA: Diagnosis present

## 2022-07-17 DIAGNOSIS — J690 Pneumonitis due to inhalation of food and vomit: Secondary | ICD-10-CM | POA: Diagnosis present

## 2022-07-17 DIAGNOSIS — Z681 Body mass index (BMI) 19 or less, adult: Secondary | ICD-10-CM | POA: Diagnosis not present

## 2022-07-17 DIAGNOSIS — Z885 Allergy status to narcotic agent status: Secondary | ICD-10-CM | POA: Diagnosis not present

## 2022-07-17 DIAGNOSIS — Z7982 Long term (current) use of aspirin: Secondary | ICD-10-CM | POA: Diagnosis not present

## 2022-07-17 DIAGNOSIS — E440 Moderate protein-calorie malnutrition: Secondary | ICD-10-CM | POA: Diagnosis present

## 2022-07-17 DIAGNOSIS — Z1152 Encounter for screening for COVID-19: Secondary | ICD-10-CM | POA: Diagnosis not present

## 2022-07-17 DIAGNOSIS — R627 Adult failure to thrive: Secondary | ICD-10-CM | POA: Diagnosis present

## 2022-07-17 DIAGNOSIS — J439 Emphysema, unspecified: Secondary | ICD-10-CM | POA: Diagnosis present

## 2022-07-17 DIAGNOSIS — Z90710 Acquired absence of both cervix and uterus: Secondary | ICD-10-CM | POA: Diagnosis not present

## 2022-07-17 DIAGNOSIS — Z720 Tobacco use: Secondary | ICD-10-CM | POA: Diagnosis not present

## 2022-07-17 DIAGNOSIS — I5031 Acute diastolic (congestive) heart failure: Secondary | ICD-10-CM | POA: Diagnosis present

## 2022-07-17 DIAGNOSIS — Z8673 Personal history of transient ischemic attack (TIA), and cerebral infarction without residual deficits: Secondary | ICD-10-CM | POA: Diagnosis not present

## 2022-07-17 DIAGNOSIS — E039 Hypothyroidism, unspecified: Secondary | ICD-10-CM | POA: Diagnosis present

## 2022-07-17 DIAGNOSIS — J441 Chronic obstructive pulmonary disease with (acute) exacerbation: Secondary | ICD-10-CM | POA: Diagnosis not present

## 2022-07-17 DIAGNOSIS — R1310 Dysphagia, unspecified: Secondary | ICD-10-CM | POA: Diagnosis present

## 2022-07-17 DIAGNOSIS — J4489 Other specified chronic obstructive pulmonary disease: Secondary | ICD-10-CM | POA: Diagnosis not present

## 2022-07-17 DIAGNOSIS — J9601 Acute respiratory failure with hypoxia: Secondary | ICD-10-CM | POA: Diagnosis present

## 2022-07-17 DIAGNOSIS — F17210 Nicotine dependence, cigarettes, uncomplicated: Secondary | ICD-10-CM | POA: Diagnosis present

## 2022-07-17 DIAGNOSIS — Z881 Allergy status to other antibiotic agents status: Secondary | ICD-10-CM | POA: Diagnosis not present

## 2022-07-17 DIAGNOSIS — R0902 Hypoxemia: Secondary | ICD-10-CM | POA: Diagnosis present

## 2022-07-17 LAB — BASIC METABOLIC PANEL
Anion gap: 9 (ref 5–15)
BUN: 25 mg/dL — ABNORMAL HIGH (ref 8–23)
CO2: 29 mmol/L (ref 22–32)
Calcium: 9 mg/dL (ref 8.9–10.3)
Chloride: 100 mmol/L (ref 98–111)
Creatinine, Ser: 0.78 mg/dL (ref 0.44–1.00)
GFR, Estimated: >60 mL/min (ref >60)
Glucose, Bld: 106 mg/dL — ABNORMAL HIGH (ref 70–99)
Potassium: 4.1 mmol/L (ref 3.5–5.1)
Sodium: 138 mmol/L (ref 135–145)

## 2022-07-17 LAB — CBC WITH DIFFERENTIAL/PLATELET
Abs Immature Granulocytes: 0.05 10*3/uL (ref 0.00–0.07)
Basophils Absolute: 0 10*3/uL (ref 0.0–0.1)
Basophils Relative: 0 %
Eosinophils Absolute: 0 10*3/uL (ref 0.0–0.5)
Eosinophils Relative: 0 %
HCT: 49 % — ABNORMAL HIGH (ref 36.0–46.0)
Hemoglobin: 15.8 g/dL — ABNORMAL HIGH (ref 12.0–15.0)
Immature Granulocytes: 0 %
Lymphocytes Relative: 8 %
Lymphs Abs: 1 10*3/uL (ref 0.7–4.0)
MCH: 31.3 pg (ref 26.0–34.0)
MCHC: 32.2 g/dL (ref 30.0–36.0)
MCV: 97 fL (ref 80.0–100.0)
Monocytes Absolute: 1 10*3/uL (ref 0.1–1.0)
Monocytes Relative: 8 %
Neutro Abs: 10 10*3/uL — ABNORMAL HIGH (ref 1.7–7.7)
Neutrophils Relative %: 84 %
Platelets: 181 10*3/uL (ref 150–400)
RBC: 5.05 MIL/uL (ref 3.87–5.11)
RDW: 14.5 % (ref 11.5–15.5)
WBC: 12.1 10*3/uL — ABNORMAL HIGH (ref 4.0–10.5)
nRBC: 0 % (ref 0.0–0.2)

## 2022-07-17 LAB — HEPATIC FUNCTION PANEL
ALT: 14 U/L (ref 0–44)
AST: 24 U/L (ref 15–41)
Albumin: 3.2 g/dL — ABNORMAL LOW (ref 3.5–5.0)
Alkaline Phosphatase: 45 U/L (ref 38–126)
Bilirubin, Direct: <0.1 mg/dL (ref 0.0–0.2)
Total Bilirubin: 0.4 mg/dL (ref 0.3–1.2)
Total Protein: 5.7 g/dL — ABNORMAL LOW (ref 6.5–8.1)

## 2022-07-17 LAB — VITAMIN B12: Vitamin B-12: 438 pg/mL (ref 180–914)

## 2022-07-17 LAB — MAGNESIUM: Magnesium: 1.9 mg/dL (ref 1.7–2.4)

## 2022-07-17 LAB — EXPECTORATED SPUTUM ASSESSMENT W GRAM STAIN, RFLX TO RESP C

## 2022-07-17 LAB — TSH: TSH: 0.178 u[IU]/mL — ABNORMAL LOW (ref 0.350–4.500)

## 2022-07-17 LAB — D-DIMER, QUANTITATIVE: D-Dimer, Quant: <0.27 ug/mL-FEU (ref 0.00–0.50)

## 2022-07-17 LAB — PHOSPHORUS: Phosphorus: 3.2 mg/dL (ref 2.5–4.6)

## 2022-07-17 MED ORDER — ACETAMINOPHEN 325 MG PO TABS
650.0000 mg | ORAL_TABLET | Freq: Four times a day (QID) | ORAL | Status: DC | PRN
  Administered 2022-07-17 – 2022-07-21 (×5): 650 mg via ORAL
  Filled 2022-07-17 (×5): qty 2

## 2022-07-17 MED ORDER — PROCHLORPERAZINE EDISYLATE 10 MG/2ML IJ SOLN: 5.0000 mg | Freq: Four times a day (QID) | INTRAMUSCULAR | Status: DC | PRN

## 2022-07-17 MED ORDER — LACTATED RINGERS IV SOLN: INTRAVENOUS | Status: AC

## 2022-07-17 NOTE — TOC Initial Note (Signed)
Transition of Care [facility]) - Initial/Assessment Note    Patient Details  Name: Melody White MRN: [id number] Date of Birth: 11-23-1950  Transition of [facility]) CM/SW Contact:    Ninfa Meeker, RN Phone Number: 07/17/2022, 11:53 AM  Clinical Narrative:                 Patient is 72 yr old female, from home with COPD Exacerbation.   Transition of Care [facility]) Department has reviewed patient and no TOC needs have been identified at this time. We will continue to monitor patient advancement through Interdisciplinary progressions and if new patient needs arise, please place a consult.        Patient Goals and CMS Choice            Expected Discharge Plan and Services                                              Prior Living Arrangements/Services                       Activities of Daily Living Home Assistive Devices/Equipment: Dentures (specify type) ADL Screening (condition at time of admission) Patient's cognitive ability adequate to safely complete daily activities?: Yes Is the patient deaf or have difficulty hearing?: No Does the patient have difficulty seeing, even when wearing glasses/contacts?: No Does the patient have difficulty concentrating, remembering, or making decisions?: No Patient able to express need for assistance with ADLs?: Yes Does the patient have difficulty dressing or bathing?: No Independently performs ADLs?: Yes (appropriate for developmental age) Does the patient have difficulty walking or climbing stairs?: Yes Weakness of Legs: Both Weakness of Arms/Hands: None  Permission Sought/Granted                  Emotional Assessment              Admission diagnosis:  Hypoxia [R09.02] COPD exacerbation ([place]) [J44.1] Patient Active Problem List   Diagnosis Date Noted   COPD with acute exacerbation ([place]) 09/02/2021   COPD (chronic obstructive pulmonary disease) with chronic bronchitis 10/11/2020   Hip fracture ([place])  10/10/2020   Malnutrition of moderate degree 09/23/2020   COPD exacerbation ([place]) 09/20/2020   Acute respiratory failure with hypoxia ([place]) 09/20/2020   Tobacco use 09/20/2020   History of CVA (cerebrovascular accident) 09/20/2020   HTN (hypertension) 09/20/2020   Hypothyroidism 09/20/2020   HLD (hyperlipidemia) 09/20/2020   PCP:  Aletha Halim., PA-C Pharmacy:   [place], [address] [place] [id number] [place] [address] Phone: [phone] Fax: [phone]     Social Determinants of Health (SDOH) Social History: SDOH Screenings   Food Insecurity: No Food Insecurity (07/16/2022)  Housing: Low Risk  (07/16/2022)  Transportation Needs: No Transportation Needs (07/16/2022)  Utilities: Not At Risk (07/16/2022)  Tobacco Use: High Risk (07/15/2022)   SDOH Interventions:     Readmission Risk Interventions     No data to display

## 2022-07-17 NOTE — Progress Notes (Addendum)
PROGRESS NOTE    Melody White  [id number] DOB: 06-06-1950 DOA: 07/15/2022 PCP: Aletha Halim., PA-C     Brief Narrative:  72 year old female with past medical history significant for hypothyroidism, TIA, COPD, current smoker, who presented to the hospital due to generalized weakness, with poor appetite and oral intake, found to have hypoxia.  Patient appears to have memory issues not able to provide reliable history.  Per Husband, she has been laying around for the last 4days, not getting up.  Workup in the ED revealed COPD exacerbation with a congested cough and hypoxia.  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  07/17/2022: The patient was seen and examined at bedside.  Weak appearing.  Advised to use incentive spirometer and flutter valve and to mobilize as tolerated.  Assessment & Plan:  Principal Problem:   COPD exacerbation ([place]) Active Problems:   Acute respiratory failure with hypoxia ([facility])   Tobacco use   Hypothyroidism  Acute hypoxic respiratory failure secondary to COPD exacerbation  Also concern for aspiration pneumonia due to dysphagia Antibiotic changed to rocephin/flagyl (h/o amoxicillin allergy) DG esophagus done on 07/17/2022 revealed mild mild dysmotility.  Otherwise normal fluoroscopic esophagram study. Currently on 2 L maintaining saturation greater than 90% Wean oxygen ventilation as tolerated Incentive spirometer Flutter valve Bronchodilators Mobilize as tolerated. CTA chest was negative for pulmonary embolism, 07/16/2022.  Leukocytosis, suspect reactive in the setting of steroids WBC 12.1 K Afebrile Monitor fever curve and WBC.  H/o TIA: Continue aspirin, Lipitor   Hypothyroidism On home Synthroid TSH 0.178 on 07/17/2022. Obtain free T4 in  the morning   Cholelithiasis, incidental finding on CT scan, denies abdominal pain, LFTs are within normal limit.   Aortic Atherosclerosis, sudden finding on CT scan, continue aspirin and Lipitor   Tobacco use: Smoking cessation education provided, she does not appear interested in quitting smoking   FTT: Progressive weakness, weight loss, poor oral intake, PT eval Seen by PT no further recommendations.     Appear thin, family reports poor oral intake and weight loss,  awaiting weight to be measured to calculation BMI, order placed  Nutrition Status: Nutrition Problem: Increased nutrient needs Etiology: chronic illness Signs/Symptoms: estimated needs Interventions: Ensure Enlive (each supplement provides 350kcal and 20 grams of protein) She appears is dehydrated with poor oral intake, start hydration   I have Reviewed nursing notes, Vitals, pain scores, I/o's, Lab results and  imaging results since pt's last encounter, details please see discussion above  I ordered the following labs:  Unresulted Labs (From admission, onward)     Start     Ordered   07/16/22 2026  Expectorated Sputum Assessment w Gram Stain, Rflx to Resp Cult  Once,   R        07/16/22 2025             DVT prophylaxis: enoxaparin (LOVENOX) injection 40 mg Start: 07/15/22 2200   Code Status:   Code Status: Full Code  Family Communication: None at bedside. Disposition:   Status is: Observation   Dispo: The patient is from: home              Anticipated d/c is to: home               Anticipated d/c date is: 07/18/2022.  Still very weak.  Need to wean off oxygen supplementation.  Antimicrobials:    Anti-infectives (From admission, onward)    Start     Dose/Rate Route Frequency Ordered Stop   07/16/22 2215  cefTRIAXone (ROCEPHIN) 1 g in sodium chloride 0.9 % 100 mL IVPB        1 g 200 mL/hr over 30 Minutes Intravenous Every 24 hours 07/16/22 2124     07/16/22 2215  metroNIDAZOLE (FLAGYL) IVPB 500 mg         500 mg 100 mL/hr over 60 Minutes Intravenous Every 12 hours 07/16/22 2124     07/15/22 2200  cefTRIAXone (ROCEPHIN) 1 g in sodium chloride 0.9 % 100 mL IVPB  Status:  Discontinued        1 g 200 mL/hr over 30 Minutes Intravenous Every 24 hours 07/15/22 2009 07/16/22 2033          Objective: Vitals:   07/16/22 2045 07/17/22 0526 07/17/22 0528 07/17/22 0814  BP: (!) 105/58 (!) 149/89    Pulse: 78 85    Resp: 13 14    Temp: 98.4 F (36.9 C) 98.5 F (36.9 C)    TempSrc: Oral Oral    SpO2: 94% 98%  93%  Weight:   53.4 kg     Intake/Output Summary (Last 24 hours) at 07/17/2022 1154 Last data filed at 07/17/2022 0806 Gross per 24 hour  Intake 725.11 ml  Output --  Net 725.11 ml   Filed Weights   07/17/22 0528  Weight: 53.4 kg    Examination:  General exam: Weak appearing in no acute distress.  She is alert and interactive. Respiratory system: Diminished, mild wheezing posteriorly.  Cardiovascular system: Regular rate and rhythm no rubs or gallops. Gastrointestinal system: Nondistended nontender bowel sounds present.   Central nervous system: Alert and interactive.   Extremities: No lower extremity edema.   Skin: No rashes, lesions or ulcers Psychiatry: Mood is appropriate for condition 7.    Data Reviewed: I have personally reviewed  labs and visualized  imaging studies since the last encounter and formulate the plan        Scheduled Meds:  atorvastatin  20 mg Oral Daily   enoxaparin (LOVENOX) injection  40 mg Subcutaneous Q24H   feeding supplement  237 mL Oral BID BM   guaiFENesin  600 mg Oral BID   levothyroxine  75 mcg Oral Daily   mometasone-formoterol  2 puff Inhalation BID   montelukast  10 mg Oral QHS   nicotine  14 mg Transdermal Daily   predniSONE  40 mg Oral Q breakfast   umeclidinium bromide  1 puff Inhalation Daily   Continuous Infusions:  cefTRIAXone (ROCEPHIN)  IV Stopped (07/16/22 2358)   lactated ringers 75 mL/hr at 07/17/22 0551    metronidazole 500 mg (07/17/22 0853)      LOS: 0 days   Time spent:  62mns  CKayleen Memos MD  [facility]  Available via Epic secure chat 7am-7pm for nonurgent issues Please page for urgent issues To page the attending provider between 7A-7P or the covering provider during after hours 7P-7A, please log into the web site [email or web address] and access using universal [place] password for that web site. If you do not have the password, please call the hospital operator.    07/17/2022, 11:54 AM

## 2022-07-17 NOTE — Plan of Care (Signed)
  Problem: Education: Goal: Knowledge of General Education information will improve Description: Including pain rating scale, medication(s)/side effects and non-pharmacologic comfort measures Outcome: Progressing   Problem: Health Behavior/Discharge Planning: Goal: Ability to manage health-related needs will improve Outcome: Progressing   Problem: Clinical Measurements: Goal: Ability to maintain clinical measurements within normal limits will improve Outcome: Progressing Goal: Will remain free from infection Outcome: Progressing Goal: Diagnostic test results will improve Outcome: Progressing Goal: Respiratory complications will improve Outcome: Progressing Goal: Cardiovascular complication will be avoided Outcome: Progressing   Problem: Activity: Goal: Risk for activity intolerance will decrease Outcome: Progressing   Problem: Nutrition: Goal: Adequate nutrition will be maintained Outcome: Progressing   Problem: Coping: Goal: Level of anxiety will decrease Outcome: Progressing   Problem: Safety: Goal: Ability to remain free from injury will improve Outcome: Progressing

## 2022-07-18 DIAGNOSIS — J441 Chronic obstructive pulmonary disease with (acute) exacerbation: Secondary | ICD-10-CM | POA: Diagnosis not present

## 2022-07-18 LAB — BASIC METABOLIC PANEL
Anion gap: 7 (ref 5–15)
BUN: 19 mg/dL (ref 8–23)
CO2: 31 mmol/L (ref 22–32)
Calcium: 8.8 mg/dL — ABNORMAL LOW (ref 8.9–10.3)
Chloride: 100 mmol/L (ref 98–111)
Creatinine, Ser: 0.67 mg/dL (ref 0.44–1.00)
GFR, Estimated: >60 mL/min (ref >60)
Glucose, Bld: 93 mg/dL (ref 70–99)
Potassium: 3.8 mmol/L (ref 3.5–5.1)
Sodium: 138 mmol/L (ref 135–145)

## 2022-07-18 LAB — CBC
HCT: 50.6 % — ABNORMAL HIGH (ref 36.0–46.0)
Hemoglobin: 16.2 g/dL — ABNORMAL HIGH (ref 12.0–15.0)
MCH: 31.3 pg (ref 26.0–34.0)
MCHC: 32 g/dL (ref 30.0–36.0)
MCV: 97.9 fL (ref 80.0–100.0)
Platelets: 150 10*3/uL (ref 150–400)
RBC: 5.17 MIL/uL — ABNORMAL HIGH (ref 3.87–5.11)
RDW: 14.6 % (ref 11.5–15.5)
WBC: 10.9 10*3/uL — ABNORMAL HIGH (ref 4.0–10.5)
nRBC: 0 % (ref 0.0–0.2)

## 2022-07-18 LAB — PHOSPHORUS: Phosphorus: 3 mg/dL (ref 2.5–4.6)

## 2022-07-18 LAB — MAGNESIUM: Magnesium: 1.9 mg/dL (ref 1.7–2.4)

## 2022-07-18 LAB — T4, FREE: Free T4: 0.99 ng/dL (ref 0.61–1.12)

## 2022-07-18 MED ORDER — MELATONIN 5 MG PO TABS
5.0000 mg | ORAL_TABLET | Freq: Once | ORAL | Status: AC
  Administered 2022-07-18: 5 mg via ORAL
  Filled 2022-07-18: qty 1

## 2022-07-18 MED ORDER — NYSTATIN 100000 UNIT/ML MT SUSP
5.0000 mL | Freq: Four times a day (QID) | OROMUCOSAL | Status: DC
  Administered 2022-07-18 – 2022-07-21 (×10): 500000 [IU] via ORAL
  Filled 2022-07-18 (×12): qty 5

## 2022-07-18 MED ORDER — FLUCONAZOLE 100 MG PO TABS
100.0000 mg | ORAL_TABLET | Freq: Every day | ORAL | Status: DC
  Filled 2022-07-18: qty 1

## 2022-07-18 MED ORDER — FLUCONAZOLE 200 MG PO TABS
200.0000 mg | ORAL_TABLET | Freq: Once | ORAL | Status: AC
  Administered 2022-07-18: 200 mg via ORAL
  Filled 2022-07-18: qty 1

## 2022-07-18 NOTE — Progress Notes (Signed)
Physical Therapy Treatment Patient Details Name: Melody White MRN: [id number] DOB: Apr 13, 1951 Today's Date: 07/18/2022   History of Present Illness Patient is a 72 year old female who presented with 2 day history of shortness of breath with O2 noted to be 85% on room air. Patient was found to have COPD exacerbation.  PMH: L IM nail 5/22, HTN, thyroid disease, stroke    PT Comments    Pt is AxO x 3 very pleasant.  OOB in recliner on 3 lts nasal at 94%.  Assisted with amb in hallway while monitoring and attempting to wean oxygen.  SATURATION QUALIFICATIONS: (This note is used to comply with regulatory documentation for home oxygen)  Patient Saturations on Room Air at Rest = 90%  Patient Saturations on Room Air while Ambulating 350 feet= 81%  Patient Saturations on 3 Liters of oxygen while Ambulating = 89%  Please briefly explain why patient needs home oxygen:  pt required supplemental oxygen to achieve therapeutic levels.  Assisted back to room and positioned in recliner as lunch tray arrived.  Pt instructed on [employer] spirometer     Recommendations for follow up therapy are one component of a multi-disciplinary discharge planning process, led by the attending physician.  Recommendations may be updated based on patient status, additional functional criteria and insurance authorization.  Follow Up Recommendations  No PT follow up     Assistance Recommended at Discharge PRN  Patient can return home with the following A little help with walking and/or transfers;Assistance with cooking/housework;Assist for transportation;Help with stairs or ramp for entrance   Equipment Recommendations  None recommended by PT    Recommendations for Other Services       Precautions / Restrictions Precautions Precaution Comments: monitor O2 Restrictions Weight Bearing Restrictions: No     Mobility  Bed Mobility               General bed mobility comments: OOB in recliner     Transfers Overall transfer level: Needs assistance Equipment used: None Transfers: Sit to/from Stand Sit to Stand: Supervision, Min guard           General transfer comment: good safety cognition and use of hands to steady self.  NO AD needed.  Pt able to stand and apply B slippers, bending over and reaching while supporting self at sink counter.    Ambulation/Gait Ambulation/Gait assistance: Supervision, Min guard Gait Distance (Feet): 350 Feet Assistive device: None Gait Pattern/deviations: Step-to pattern, Step-through pattern, Shuffle Gait velocity: WFL     General Gait Details: tolerated a functional distance.  NO AD needed this session.  Occassional "light" lean on hall rails < 25% of time.  Monitoring sats.   Stairs             Wheelchair Mobility    Modified Rankin (Stroke Patients Only)       Balance                                            Cognition Arousal/Alertness: Awake/alert Behavior During Therapy: WFL for tasks assessed/performed Overall Cognitive Status: Within Functional Limits for tasks assessed                                 General Comments: AxO x 3 very pleasant  Exercises      General Comments        Pertinent Vitals/Pain Pain Assessment Pain Assessment: No/denies pain    Home Living                          Prior Function            PT Goals (current goals can now be found in the care plan section) Progress towards PT goals: Progressing toward goals    Frequency    Min 3X/week      PT Plan Current plan remains appropriate    Co-evaluation              AM-PAC PT "6 Clicks" Mobility   Outcome Measure  Help needed turning from your back to your side while in a flat bed without using bedrails?: None Help needed moving from lying on your back to sitting on the side of a flat bed without using bedrails?: None Help needed moving to and from a bed to  a chair (including a wheelchair)?: None Help needed standing up from a chair using your arms (e.g., wheelchair or bedside chair)?: None Help needed to walk in hospital room?: None Help needed climbing 3-5 steps with a railing? : A Little 6 Click Score: 23    End of Session Equipment Utilized During Treatment: Gait belt;Oxygen Activity Tolerance: Patient tolerated treatment well Patient left: in chair;with call bell/phone within reach;with chair alarm set Nurse Communication: Mobility status PT Visit Diagnosis: Unsteadiness on feet (R26.81);Difficulty in walking, not elsewhere classified (R26.2);Pain     Time: [id number] PT Time Calculation (min) (ACUTE ONLY): 25 min  Charges:  $Gait Training: 8-22 mins $Therapeutic Activity: 8-22 mins                     Rica Koyanagi  PTA [place] Office M-F          [phone] Weekend pager [phone]

## 2022-07-18 NOTE — Plan of Care (Signed)

## 2022-07-18 NOTE — Plan of Care (Signed)
  Problem: Education: Goal: Knowledge of General Education information will improve Description: Including pain rating scale, medication(s)/side effects and non-pharmacologic comfort measures Outcome: Progressing   Problem: Health Behavior/Discharge Planning: Goal: Ability to manage health-related needs will improve Outcome: Progressing   Problem: Clinical Measurements: Goal: Ability to maintain clinical measurements within normal limits will improve Outcome: Progressing Goal: Will remain free from infection Outcome: Progressing Goal: Diagnostic test results will improve Outcome: Progressing Goal: Respiratory complications will improve Outcome: Progressing   Problem: Activity: Goal: Risk for activity intolerance will decrease Outcome: Progressing   Problem: Nutrition: Goal: Adequate nutrition will be maintained Outcome: Progressing   Problem: Coping: Goal: Level of anxiety will decrease Outcome: Progressing   Problem: Safety: Goal: Ability to remain free from injury will improve Outcome: Progressing   Problem: Skin Integrity: Goal: Risk for impaired skin integrity will decrease Outcome: Progressing

## 2022-07-18 NOTE — Evaluation (Signed)
Clinical/Bedside Swallow Evaluation Patient Details  Name: Melody White MRN: [id number] Date of Birth: [birth year]  Today's Date: 07/18/2022 Time: SLP Start Time (ACUTE ONLY): 17 SLP Stop Time (ACUTE ONLY): 1630 SLP Time Calculation (min) (ACUTE ONLY): 29 min  Past Medical History:  Past Medical History:  Diagnosis Date   Hypertension    Stroke [facility])    Thyroid disease    Past Surgical History:  Past Surgical History:  Procedure Laterality Date   ABDOMINAL HYSTERECTOMY     bowel obstruction     BREAST EXCISIONAL BIOPSY Right 20+ yrs ago   benign   INTRAMEDULLARY (IM) NAIL INTERTROCHANTERIC Left 10/11/2020   Procedure: INTRAMEDULLARY (IM) NAIL INTERTROCHANTRIC;  Surgeon: Shona Needles, MD;  Location: [place];  Service: Orthopedics;  Laterality: Left;   SHOULDER SURGERY     HPI:  72 yo adm to [facility] with weakness - inability to get OOB for four days PTA, acute respiratory failure - treated as COPD exacerbation, dysphagia since 2004 when 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  Swallow eval ordered by Dr Nevada Crane.  Pt is s/p esophagram that showed mild dysmotlity - however SLP has concerns for pharyngeal motility issues given XRT. Pt reports her dysphagia may contribute to her weight loss - as she reports she stops eating solids due to having to regurgitate. She advised that she feels food "sticking" and then it will "come back up". She states the food and drink come back up together.    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  These deficits may cause pharyngeal retention, dysphagia as well as aspiration. Pt also has lengthy h/o LPR - s/p seen by Dr Fredric Dine - s/p laryngoscopy with findings of edema and erythema - with adequate vocal  fold motility.  Pt admits to occasional issues with nasal regurgitation of liquids but denies coughing/choking with liquids.  Sensing food lodging *pointing to distal pharynx* causing her to "choke and cough" approx half way through meal. Pt also with likely oral candidiasis - without sensation- Messaged MD about these concerns. 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 Pt agreeable to plan. SLP Visit Diagnosis: Dysphagia, pharyngoesophageal phase (R13.14);Dysphagia, unspecified (R13.10)    Aspiration Risk  Moderate aspiration risk    Diet Recommendation Regular;Thin liquid   Liquid Administration via: Straw Medication Administration: Whole meds with liquid Supervision: Patient able to self feed Compensations: Slow rate;Small sips/bites Postural Changes: Seated upright at 90 degrees;Remain upright for at least 30 minutes after po intake    Other  Recommendations Oral Care Recommendations: Oral care BID    Recommendations for follow up therapy are one component of a multi-disciplinary discharge planning process, led by the attending physician.  Recommendations may be updated based on patient status, additional functional criteria and insurance authorization.  Follow up Recommendations Other (comment)      Assistance Recommended at Discharge  TBD  Functional Status Assessment Patient has had a recent decline in their functional status and/or demonstrates limited ability to make significant improvements in function in a reasonable and predictable amount of time  Frequency and Duration min 1 x/week  1 week       Prognosis  Prognosis for improved oropharyngeal function: Fair Barriers to Reach Goals: Time post onset      Swallow Study   General HPI: 72 yo adm to [facility] with weakness - inability to get OOB for four days PTA, acute respiratory  failure - treated as COPD exacerbation, dysphagia since 2004 when 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  Swallow eval ordered by Dr Nevada Crane.  Pt is s/p esophagram that showed mild dysmotlity - however SLP has concerns for pharyngeal motility issues given XRT. Pt reports her dysphagia may contribute to her weight loss - as she reports she stops eating solids due to having to regurgitate. She advised that she feels food "sticking" and then it will "come back up". She states the food and drink come back up together. Type of Study: Bedside Swallow Evaluation Previous Swallow Assessment: DG esophagram 02/2022, 07/2022 - Diet Prior to this Study: Regular;Thin liquids (Level 0) Temperature Spikes Noted: No Respiratory Status: Nasal cannula History of Recent Intubation: No Behavior/Cognition: Alert;Cooperative;Pleasant mood Oral Cavity Assessment: Erythema;Dry Oral Care Completed by SLP: No Oral Cavity - Dentition: Dentures, top;Dentures, bottom Vision: Functional for self-feeding Self-Feeding Abilities: Able to feed self Patient Positioning: Upright in chair Baseline Vocal Quality: Normal Volitional Cough: Strong Volitional Swallow: Able to elicit    Oral/Motor/Sensory Function Overall Oral Motor/Sensory Function: Other (comment) (lingual protrusion appears limited, suspect due to impaired lingual ROM)   Ice Chips Ice chips: Not tested   Thin Liquid Thin Liquid: Impaired Presentation: Self Fed;Straw Pharyngeal  Phase Impairments: Multiple swallows    Nectar Thick Nectar Thick Liquid: Not tested   Honey Thick Honey Thick Liquid: Not tested   Puree Puree: Impaired Presentation: Self Fed;Spoon Pharyngeal Phase Impairments: Multiple swallows   Solid     Solid: Impaired Presentation: Self Fed Pharyngeal Phase Impairments: Suspected delayed Swallow;Multiple swallows      Macario Golds 07/18/2022,4:51 PM  Kathleen Lime, MS [place] Office [phone]

## 2022-07-18 NOTE — Progress Notes (Signed)
Oropharyngeal reported by speech therapist, po fluconazole x 14 days and Nystatin mouthwash added.  The patient will benefit from [facility] due to concern for possible pharyngeal-esophageal deficit from XRT on right side of her throat in 2004.  We will continue to closely monitor and treat as indicated.

## 2022-07-18 NOTE — Progress Notes (Signed)
PROGRESS NOTE    Melody White  [id number] DOB: May 22, 1950 DOA: 07/15/2022 PCP: Aletha Halim., PA-C     Brief Narrative:  72 year old female with past medical history significant for hypothyroidism, TIA, COPD, current smoker 1 pack of cigarettes lasts 2 days, who presented to the hospital due to generalized weakness, with poor appetite and oral intake, found to have hypoxia.  Endorses having trouble swallowing solid foods.  Per Husband, she has been laying around for the last 4days, not getting up.  Workup in the ED revealed COPD exacerbation with a congested cough and hypoxia.  Not on oxygen supplementation at baseline.  CT angio chest negative for PE, however revealed persistent collapse in the right middle lobe with attenuation of the right middle lobe bronchus.  Denies history of surgery to her lungs.  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  07/18/2022: The patient was seen and examined at bedside.  States she has been coughing, sputum appears purulent.  She failed her home oxygen evaluation today, requiring 3 L to maintain O2 saturation greater than 90%.  Speech therapy consulted due to reported dysphagia to solids.  Evaluated by PT no further recommendations.    Assessment & Plan:  Principal Problem:   COPD exacerbation ([facility]) Active Problems:   Acute respiratory failure with hypoxia (HCC)   Tobacco use   Hypothyroidism   COPD with acute exacerbation (HCC)  Acute hypoxic respiratory failure secondary to COPD exacerbation  Also concern for aspiration pneumonia due to dysphagia to solids Antibiotic changed to rocephin/flagyl (h/o amoxicillin allergy) DG esophagus done on 07/17/2022 revealed mild dysmotility.  Otherwise normal fluoroscopic esophagram study. Currently on 3 L to maintain her O2 saturation greater than 90%. Wean off O2 supplementation as tolerated. Not on oxygen supplementation at baseline. Continue incentive spirometer continue  Flutter valve Continue bronchodilators Continue to mobilize as tolerated. CTA chest was negative for pulmonary embolism, 07/16/2022.  Persistent collapse in the right middle lobe with attenuation of the right middle lobe bronchus. Recommend follow-up with pulmonary  Resolving leukocytosis, suspect reactive in the setting of steroids WBC 12.1 K>> 10.9. Afebrile Monitor fever curve and WBC.  H/o TIA: Continue home aspirin and home Lipitor   Hypothyroidism On home Synthroid TSH 0.178 on 07/17/2022. Free T4 is 0.99.   Cholelithiasis, incidental finding on CT scan, denies abdominal pain, LFTs are within normal limit.   Aortic Atherosclerosis, sudden finding on CT scan, continue aspirin and Lipitor   Tobacco use: Admits to regularly smoking 1 pack of cigarettes for 2 days.  Smoking cessation education provided, she does not appear interested in quitting smoking   FTT: Progressive weakness, weight loss, poor oral intake, PT eval Seen by PT no further recommendations. Continue to mobilize as tolerated   Moderate protein calorie malnutrition BMI 17 Moderate muscle mass loss Encourage oral intake.  Nutrition Status: Nutrition Problem: Increased nutrient needs Etiology: chronic illness Signs/Symptoms: estimated needs Interventions: Ensure Enlive (each supplement provides 350kcal and 20 grams of protein) She appears is dehydrated with poor oral intake, continue IV fluid hydration, LR 50 cc/h x 2 days  I have Reviewed nursing notes, Vitals, pain scores, I/o's, Lab results and  imaging results since pt's last encounter, details please see discussion above  I ordered the following labs:  Unresulted Labs (From admission, onward)    None        DVT prophylaxis: enoxaparin (LOVENOX) injection 40 mg Start: 07/15/22 2200   Code Status:   Code  Status: Full Code  Family Communication: None at bedside. Disposition:   Status is: Observation   Dispo: The patient is from: home               Anticipated d/c is to: home               Anticipated d/c date is: 07/19/2022.  Still very weak.  Need to wean off oxygen supplementation.  Antimicrobials:    Anti-infectives (From admission, onward)    Start     Dose/Rate Route Frequency Ordered Stop   07/16/22 2215  cefTRIAXone (ROCEPHIN) 1 g in sodium chloride 0.9 % 100 mL IVPB        1 g 200 mL/hr over 30 Minutes Intravenous Every 24 hours 07/16/22 2124     07/16/22 2215  metroNIDAZOLE (FLAGYL) IVPB 500 mg        500 mg 100 mL/hr over 60 Minutes Intravenous Every 12 hours 07/16/22 2124     07/15/22 2200  cefTRIAXone (ROCEPHIN) 1 g in sodium chloride 0.9 % 100 mL IVPB  Status:  Discontinued        1 g 200 mL/hr over 30 Minutes Intravenous Every 24 hours 07/15/22 2009 07/16/22 2033          Objective: Vitals:   07/18/22 0839 07/18/22 0843 07/18/22 1320 07/18/22 1353  BP:    105/69  Pulse:    82  Resp:    17  Temp:    98 F (36.7 C)  TempSrc:    Oral  SpO2: 92% 92%  92%  Weight:      Height:   '5\' 6"'$  (1.676 m)     Intake/Output Summary (Last 24 hours) at 07/18/2022 1439 Last data filed at 07/18/2022 0840 Gross per 24 hour  Intake 1699.14 ml  Output --  Net 1699.14 ml   Filed Weights   07/17/22 0528 07/18/22 0424  Weight: 53.4 kg 49.8 kg    Examination:  General exam: Frail-appearing in no acute distress.  She is alert and oriented x 3.   Respiratory system: Mild wheezing posteriorly.   Cardiovascular system: Regular rate and rhythm no rubs or gallops Gastrointestinal system: Nondistended nontender bowel sounds present.   Central nervous system: Alert and interactive. Extremities: No lower extremity edema bilaterally. Skin: No rashes, lesions or ulcers Psychiatry: Mood is appropriate for condition setting.    Data Reviewed: I have personally reviewed  labs and visualized  imaging studies since the last encounter and formulate the plan        Scheduled Meds:  atorvastatin  20 mg Oral Daily    enoxaparin (LOVENOX) injection  40 mg Subcutaneous Q24H   feeding supplement  237 mL Oral BID BM   guaiFENesin  600 mg Oral BID   levothyroxine  75 mcg Oral Daily   mometasone-formoterol  2 puff Inhalation BID   montelukast  10 mg Oral QHS   nicotine  14 mg Transdermal Daily   predniSONE  40 mg Oral Q breakfast   umeclidinium bromide  1 puff Inhalation Daily   Continuous Infusions:  cefTRIAXone (ROCEPHIN)  IV Stopped (07/17/22 2316)   lactated ringers 50 mL/hr at 07/18/22 [id number]   metronidazole 500 mg (07/18/22 1030)      LOS: 1 day   Time spent:  74mns  CKayleen Memos MD  [facility]  Available via Epic secure chat 7am-7pm for nonurgent issues Please page for urgent issues To page the attending provider between 7A-7P or the covering provider during  after hours 7P-7A, please log into the web site [email or web address] and access using universal [place] password for that web site. If you do not have the password, please call the hospital operator.    07/18/2022, 2:39 PM

## 2022-07-19 ENCOUNTER — Inpatient Hospital Stay (HOSPITAL_COMMUNITY): Payer: Medicare Other

## 2022-07-19 DIAGNOSIS — R0902 Hypoxemia: Secondary | ICD-10-CM

## 2022-07-19 DIAGNOSIS — J441 Chronic obstructive pulmonary disease with (acute) exacerbation: Secondary | ICD-10-CM | POA: Diagnosis not present

## 2022-07-19 DIAGNOSIS — E039 Hypothyroidism, unspecified: Secondary | ICD-10-CM | POA: Diagnosis not present

## 2022-07-19 LAB — CBC
HCT: 48.5 % — ABNORMAL HIGH (ref 36.0–46.0)
Hemoglobin: 15.6 g/dL — ABNORMAL HIGH (ref 12.0–15.0)
MCH: 31.2 pg (ref 26.0–34.0)
MCHC: 32.2 g/dL (ref 30.0–36.0)
MCV: 97 fL (ref 80.0–100.0)
Platelets: 151 10*3/uL (ref 150–400)
RBC: 5 MIL/uL (ref 3.87–5.11)
RDW: 14.6 % (ref 11.5–15.5)
WBC: 9.9 10*3/uL (ref 4.0–10.5)
nRBC: 0 % (ref 0.0–0.2)

## 2022-07-19 LAB — BASIC METABOLIC PANEL
Anion gap: 7 (ref 5–15)
BUN: 18 mg/dL (ref 8–23)
CO2: 29 mmol/L (ref 22–32)
Calcium: 8.6 mg/dL — ABNORMAL LOW (ref 8.9–10.3)
Chloride: 101 mmol/L (ref 98–111)
Creatinine, Ser: 0.61 mg/dL (ref 0.44–1.00)
GFR, Estimated: >60 mL/min (ref >60)
Glucose, Bld: 108 mg/dL — ABNORMAL HIGH (ref 70–99)
Potassium: 3.8 mmol/L (ref 3.5–5.1)
Sodium: 137 mmol/L (ref 135–145)

## 2022-07-19 MED ORDER — FLUCONAZOLE 200 MG PO TABS
200.0000 mg | ORAL_TABLET | Freq: Every day | ORAL | Status: DC
  Administered 2022-07-19 – 2022-07-21 (×3): 200 mg via ORAL
  Filled 2022-07-19 (×3): qty 1

## 2022-07-19 MED ORDER — DIPHENHYDRAMINE HCL 25 MG PO CAPS
50.0000 mg | ORAL_CAPSULE | Freq: Once | ORAL | Status: AC
  Administered 2022-07-19: 50 mg via ORAL
  Filled 2022-07-19: qty 2

## 2022-07-19 NOTE — Progress Notes (Signed)
Mobility Specialist - Progress Note   07/19/22 1251  Oxygen Therapy  O2 Device Nasal Cannula  O2 Flow Rate (L/min) 2 L/min  Mobility  Activity Ambulated with assistance in hallway  Level of Assistance Modified independent, requires aide device or extra time  Assistive Device None  Distance Ambulated (ft) 350 ft  Activity Response Tolerated well  Mobility Referral Yes  $Mobility charge 1 Mobility   Pt received in chair and agreed to mobility. Had no c/o pain nor discomfort during session. Pt returned to chair with all needs met and family in room.  Roderick Pee Mobility Specialist

## 2022-07-19 NOTE — Procedures (Addendum)
Modified Barium Swallow Study  Patient Details  Name: Melody White MRN: [id number] Date of Birth: 05-Jul-1950  Today's Date: 07/19/2022  Modified Barium Swallow completed.  Full report located under Chart Review in the Imaging Section.  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  Swallow eval ordered by Dr Nevada Crane.  Pt is s/p esophagram that showed mild dysmotlity - however SLP has concerns for pharyngeal motility issues given XRT. Pt reports her dysphagia may contribute to her weight loss - as she reports she stops eating solids due to having to regurgitate. She advised that she feels food "sticking" and then it will "come back up". She states the food and drink come back up together.   Clinical Impression Patient presents with moderate oropharyngeal-cervical esophageal dysphagia due to iatrogenic effects of XRT from 2004.  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  Fortunately, patient senses retention and uses liquids and dry swallows to clear.  No aspiration or laryngeal penetration observed during entire study even with sequential swallows. Laryngeal elevation/closure is excellent.    Impaired tongue base retraction, pharyngeal motility allows vallecular more than pyriform sinus retention- much more impaired with solids than liquids.  At one time, she sensed retention in distal pharynx - when her pharynx appeared clear.    PES opening in this patient appears impaired including timing and adequacy of opening causing near retrograde propulsion from proximal esophagus into pharynx. Liquid was again effective to decrease retention. Chin tuck posture decreased amount of retention with puree.     Recommend continue diet with strict use of compensation  strategies, SLP educated pt to findings/recommendations using teach back.  As patient reports her dysphagia has contributed to weight loss, she may benefit from ENT referral as an OP to determine if PES intervention may be warranted to help maximize her nutrition.  Fortunately, she is protective of her airway with her strategies. Factors that may increase risk of adverse event in presence of aspiration ([place] 2021): Frail or deconditioned;Inadequate oral hygiene;Reduced saliva;Poor general health and/or compromised immunity;Respiratory or GI disease  Swallow Evaluation Recommendations Recommendations: PO diet PO Diet Recommendation: Dysphagia 3 (Mechanical soft);Regular;Thin liquids (Level 0) Liquid Administration via: Cup;Straw Medication Administration: Whole meds with liquid Supervision: Patient able to self-feed Swallowing strategies  : Minimize environmental distractions;Slow rate;Small bites/sips;Follow solids with liquids;Start intake with liquids; Multiple swallows Postural changes: Position pt fully upright for meals;Stay upright 30-60 min after meals Oral care recommendations: Oral care BID (2x/day)     Kathleen Lime, MS [facility] SLP Acute Rehab Services Office [phone]  Macario Golds 07/19/2022,9:56 AM

## 2022-07-19 NOTE — Progress Notes (Signed)
Occupational Therapy Treatment Patient Details Name: Melody White MRN: [id number] DOB: 02-Aug-1950 Today's Date: 07/19/2022   History of present illness Patient is a 72 year old female who presented with 2 day history of shortness of breath with O2 noted to be 85% on room air. Patient was found to have COPD exacerbation.  PMH: L IM nail 5/22, HTN, thyroid disease, stroke   OT comments  Patient reports independence with ADLs. She exhibits no apparent physical deficits. She was found on 3 L [place] but able to ambulate on RA for short distance. Eventually her o2 sat began to drop to below 90s. Pleth signal difficulty so unsure of exact o2 saturation but suspect she may need to go home on oxygen. She has no complaints of shortness of breath or poor activity tolerance. From an OT standpoint patient has no further OT needs - just needs to be monitored for home o2.   Recommendations for follow up therapy are one component of a multi-disciplinary discharge planning process, led by the attending physician.  Recommendations may be updated based on patient status, additional functional criteria and insurance authorization.    Follow Up Recommendations  No OT follow up     Assistance Recommended at Discharge Intermittent Supervision/Assistance  Patient can return home with the following  Assistance with cooking/housework;Assist for transportation   Equipment Recommendations  None recommended by OT    Recommendations for Other Services      Precautions / Restrictions Precautions Precaution Comments: monitor O2 Restrictions Weight Bearing Restrictions: No       Mobility Bed Mobility               General bed mobility comments: OOB in recliner    Transfers Overall transfer level: Modified independent                 General transfer comment: Ambulating in room pushing IV pole.     Balance Overall balance assessment: Mild deficits observed, not formally tested                                          ADL either performed or assessed with clinical judgement   ADL Overall ADL's : Independent                                            Extremity/Trunk Assessment Upper Extremity Assessment Upper Extremity Assessment: Overall WFL for tasks assessed   Lower Extremity Assessment Lower Extremity Assessment: Defer to PT evaluation   Cervical / Trunk Assessment Cervical / Trunk Assessment: Normal    Vision   Vision Assessment?: No apparent visual deficits   Perception     Praxis      Cognition Arousal/Alertness: Awake/alert Behavior During Therapy: WFL for tasks assessed/performed Overall Cognitive Status: Within Functional Limits for tasks assessed                                          Exercises      Shoulder Instructions       General Comments      Pertinent Vitals/ Pain       Pain Assessment Pain Assessment: No/denies pain  Home Living  Prior Functioning/Environment              Frequency  Min 2X/week        Progress Toward Goals  OT Goals(current goals can now be found in the care plan section)  Progress towards OT goals: Goals met/education completed, patient discharged from OT  Acute Rehab OT Goals OT Goal Formulation: All assessment and education complete, DC therapy  Plan All goals met and education completed, patient discharged from OT services    Co-evaluation                 AM-PAC OT "6 Clicks" Daily Activity     Outcome Measure   Help from another person eating meals?: None Help from another person taking care of personal grooming?: None Help from another person toileting, which includes using toliet, bedpan, or urinal?: None Help from another person bathing (including washing, rinsing, drying)?: None Help from another person to put on and taking off regular upper body clothing?: None Help  from another person to put on and taking off regular lower body clothing?: None 6 Click Score: 24    End of Session Equipment Utilized During Treatment: Gait belt  OT Visit Diagnosis: Unsteadiness on feet (R26.81);Other abnormalities of gait and mobility (R26.89)   Activity Tolerance Patient tolerated treatment well   Patient Left in chair;with call bell/phone within reach   Nurse Communication Mobility status        Time: [id number] OT Time Calculation (min): 17 min  Charges: OT General Charges $OT Visit: 1 Visit OT Treatments $Therapeutic Activity: 8-22 mins  Gustavo Lah, OTR/L [place]  Office [phone]   Lenward Chancellor 07/19/2022, 10:26 AM

## 2022-07-19 NOTE — Progress Notes (Signed)
Speech Language Pathology Treatment: Dysphagia  Patient Details Name: Melody White MRN: [id number] DOB: 10/21/1950 Today's Date: 07/19/2022 Time: [id number] SLP Time Calculation (min) (ACUTE ONLY): 16 min  Assessment / Plan / Recommendation Clinical Impression  Pt seen for dysphagia goals including reviewing MBS with her - reviewing flouro loops. Pt is compensating well for her dysphagia - but she admits to weight loss despite use of compensation strategies. Note she piecemeals to compensate for her dysphagia - causing SLP to question if these rapid multiple swallows may disrupt her primary peristaltic wave - contributing to her dysmotility. Advised she try to take a sip of liquid after single swallow with solids to see if prevents her from regurgitation.   Provided pt with compensation strategies using teach back and in writing and printed her MBS report as well as ENT tips from [place] 03/2022.   Recommended small frequent meals as tolerated and pt to maintain strength of expectoration *tongue base clearance.  It pt's swallowing continues to worsen, she may benefit from follow up with an ENT and/or dedicated dysphagia clinic as an OP to determine if PES intevention is warranted.  As she can tolerate liquids better, maximizing liquid nutrition advised - ie Ensures, etc.     Due to length of time post-XRT, she will not benefit from swallowing exercises unfortunately. Pt agreed to findings/recommendations.     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  Swallow eval ordered by Dr Nevada Crane.  Pt is s/p esophagram that showed mild dysmotlity - however SLP has concerns for pharyngeal motility issues given XRT. Pt reports her dysphagia may contribute to her weight loss - as she reports she stops eating  solids due to having to regurgitate. She advised that she feels food "sticking" and then it will "come back up". She states the food and drink come back up together.      SLP Plan  All goals met      Recommendations for follow up therapy are one component of a multi-disciplinary discharge planning process, led by the attending physician.  Recommendations may be updated based on patient status, additional functional criteria and insurance authorization.    Recommendations  Diet recommendations: Regular;Thin liquid Liquids provided via: Cup;Straw Medication Administration: Whole meds with liquid Supervision: Patient able to self feed Compensations: Slow rate;Small sips/bites;Other (Comment) (start intake with liquid, follow every bite of food with sip of liquid) Postural Changes and/or Swallow Maneuvers: Seated upright 90 degrees;Upright 30-60 min after meal                Oral Care Recommendations: Oral care BID Follow Up Recommendations: No SLP follow up SLP Visit Diagnosis: Dysphagia, oropharyngeal phase (R13.12);Dysphagia, pharyngoesophageal phase (R13.14) Plan: All goals met        Melody Lime, MS [facility] SLP Acute Rehab Services Office [phone]    Melody White  07/19/2022, 5:51 PM

## 2022-07-19 NOTE — Progress Notes (Signed)
[facility]                                                                              Taylee Yew, is a 72 y.o. female, DOB - [birth year], [id number] Admit date - 07/15/2022    Outpatient Primary MD for the patient is Aletha Halim., PA-C  LOS - 2  days  Chief Complaint  Patient presents with   Fatigue       Brief summary   Patient is a 72 year old female with hypothyroidism, TIA, COPD, current smoker 1 pack of cigarettes lasts 2 days, who presented to the hospital due to generalized weakness, with poor appetite and oral intake, found to have hypoxia.  Endorses having trouble swallowing solid foods.  Per Husband, she has been laying around for the last 4days, not getting up.  Workup in the ED revealed COPD exacerbation with a congested cough and hypoxia.  Not on oxygen supplementation at baseline.  CTA chest showed no PE however revealed persistent collapse in the right middle lobe with attenuation of the right middle lobe bronchus.     Assessment & Plan    Principal Problem: Acute respiratory failure with hypoxia secondary to COPD exacerbation. -Continue IV Rocephin, Flagyl. -Currently on 2 L O2 via [place], wean O2 as tolerated.  Not on O2 at baseline. - home O2 evaluation prior to discharge. -Continue bronchodilators, flutter valve, I-S, mobilize as tolerated. -CTA chest negative for PE.  Active Problems: Persistent collapse in the right middle lobe with attenuation of right middle lobe bronchus -If unable to wean off of O2, will discuss with pulmonology otherwise outpatient follow-up with pulmonology recommended.  History of TIA -Currently no acute issues, continue aspirin, Lipitor  Hypothyroidism -Continue Synthroid, TSH 0.17 on 3/4  Dysphagia -Underwent MBS today, recommended dysphagia 3 diet with thin liquids  Cholelithiasis -Incidental finding on CT chest, currently no acute issues, no abdominal pain, nausea vomiting, LFTs normal.  Tobacco  use -Currently smoking 0.5 pack/day -Smoking cessation recommended   Moderate protein calorie malnutrition Nutrition Problem: Increased nutrient needs Etiology: chronic illness Interventions: Ensure Enlive (each supplement provides 350kcal and 20 grams of protein)  Estimated body mass index is 17.72 kg/m as calculated from the following:   Height as of this encounter: '5\' 6"'$  (1.676 m).   Weight as of this encounter: 49.8 kg.  Code Status: Full CODE STATUS DVT Prophylaxis:  enoxaparin (LOVENOX) injection 40 mg Start: 07/15/22 2200   Level of Care: Level of care: Med-Surg Family Communication: Updated patient Disposition Plan:      Remains inpatient appropriate: If improving or weaned off of O2, hopefully DC home tomorrow   Procedures:  MBS  Consultants:   None  Antimicrobials:   Anti-infectives (From admission, onward)    Start     Dose/Rate Route Frequency Ordered Stop   07/19/22 1000  fluconazole (DIFLUCAN) tablet 100 mg  Status:  Discontinued        100 mg Oral Daily 07/18/22 1618 07/19/22 0905   07/19/22 1000  fluconazole (DIFLUCAN) tablet 200 mg        200 mg Oral Daily 07/19/22 0905 08/02/22 0959   07/18/22  1715  fluconazole (DIFLUCAN) tablet 200 mg        200 mg Oral  Once 07/18/22 1618 07/18/22 1725   07/16/22 2215  cefTRIAXone (ROCEPHIN) 1 g in sodium chloride 0.9 % 100 mL IVPB        1 g 200 mL/hr over 30 Minutes Intravenous Every 24 hours 07/16/22 2124     07/16/22 2215  metroNIDAZOLE (FLAGYL) IVPB 500 mg        500 mg 100 mL/hr over 60 Minutes Intravenous Every 12 hours 07/16/22 2124     07/15/22 2200  cefTRIAXone (ROCEPHIN) 1 g in sodium chloride 0.9 % 100 mL IVPB  Status:  Discontinued        1 g 200 mL/hr over 30 Minutes Intravenous Every 24 hours 07/15/22 2009 07/16/22 2033          Medications  atorvastatin  20 mg Oral Daily   enoxaparin (LOVENOX) injection  40 mg Subcutaneous Q24H   feeding supplement  237 mL Oral BID BM   fluconazole  200  mg Oral Daily   guaiFENesin  600 mg Oral BID   levothyroxine  75 mcg Oral Daily   mometasone-formoterol  2 puff Inhalation BID   montelukast  10 mg Oral QHS   nicotine  14 mg Transdermal Daily   nystatin  5 mL Oral QID   predniSONE  40 mg Oral Q breakfast   umeclidinium bromide  1 puff Inhalation Daily      Subjective:   Lenoir Legg was seen and examined today.  Sitting up in the chair, feels overall improving.  Still on O2 3 L via .  Patient denies abdominal pain, N/V/D/C, new weakness, no fevers. No acute events overnight.    Objective:   Vitals:   07/18/22 1948 07/18/22 2001 07/19/22 0437 07/19/22 0921  BP:  116/68 (!) 149/86   Pulse:  79 79   Resp:  18 18   Temp:  98.3 F (36.8 C) 98.4 F (36.9 C)   TempSrc:   Oral   SpO2: 95% 94% 94% 92%  Weight:      Height:        Intake/Output Summary (Last 24 hours) at 07/19/2022 1405 Last data filed at 07/19/2022 0500 Gross per 24 hour  Intake 478 ml  Output --  Net 478 ml     Wt Readings from Last 3 Encounters:  07/18/22 49.8 kg  09/13/21 50.8 kg  09/01/21 50.8 kg     Exam General: Alert and oriented x 3, NAD Cardiovascular: S1 S2 auscultated,  RRR Respiratory: Clear to auscultation bilaterally Gastrointestinal: Soft, nontender, nondistended, + bowel sounds Ext: no pedal edema bilaterally Neuro: Strength 5/5 upper and lower extremities bilaterally Psych: Normal affect     Data Reviewed:  I have personally reviewed following labs    CBC Lab Results  Component Value Date   WBC 9.9 07/19/2022   RBC 5.00 07/19/2022   HGB 15.6 (H) 07/19/2022   HCT 48.5 (H) 07/19/2022   MCV 97.0 07/19/2022   MCH 31.2 07/19/2022   PLT 151 07/19/2022   MCHC 32.2 07/19/2022   RDW 14.6 07/19/2022   LYMPHSABS 1.0 07/17/2022   MONOABS 1.0 07/17/2022   EOSABS 0.0 07/17/2022   BASOSABS 0.0 [id number]     Last metabolic panel Lab Results  Component Value Date   NA 137 07/19/2022   K 3.8 07/19/2022   CL 101 07/19/2022    CO2 29 07/19/2022   BUN 18 07/19/2022   CREATININE 0.61  07/19/2022   GLUCOSE 108 (H) 07/19/2022   GFRNONAA >60 07/19/2022   CALCIUM 8.6 (L) 07/19/2022   PHOS 3.0 07/18/2022   PROT 5.7 (L) 07/17/2022   ALBUMIN 3.2 (L) 07/17/2022   BILITOT 0.4 07/17/2022   ALKPHOS 45 07/17/2022   AST 24 07/17/2022   ALT 14 07/17/2022   ANIONGAP 7 07/19/2022    CBG (last 3)  No results for input(s): "GLUCAP" in the last 72 hours.    Coagulation Profile: No results for input(s): "INR", "PROTIME" in the last 168 hours.   Radiology Studies: I have personally reviewed the imaging studies  DG Swallowing Func-Speech Pathology  Result Date: 07/19/2022 Table formatting from the original result was not included. 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  Swallow eval ordered by Dr Nevada Crane.  Pt is s/p esophagram that showed mild dysmotlity - however SLP has concerns for pharyngeal motility issues given XRT. Pt reports her dysphagia may contribute to her weight loss - as she reports she stops eating solids due to having to regurgitate. She advised that she feels food "sticking" and then it will "come back up". She states the food and drink come back up together. Clinical Impression Patient presents with moderate oropharyngeal-cervical esophageal dysphagia due to iatrogenic effects of XRT from 2004.  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  Fortunately, patient senses retention and uses liquids and dry swallows to clear.  No aspiration or laryngeal penetration observed during entire study even with sequential swallows. Laryngeal elevation/closure is  excellent.    Impaired tongue base retraction, pharyngeal motility allows vallecular more than pyriform sinus retention- much more impaired with solids than liquids.  At one time, she sensed retention in distal pharynx - when her pharynx appeared clear.  PES opening in this patient appears impaired including timing and adequacy of opening causing near retrograde propulsion from proximal esophagus into pharynx. Liquid was again effective to decrease retention. Chin tuck posture decreased amount of retention with puree.   Recommend continue diet with strict use of compensation strategies, SLP educated pt to findings/recommendations using teach back.  As patient reports her dysphagia has contributed to weight loss, she may benefit from ENT referral as an OP to determine if PES intervention may be warranted to help maximize her nutrition.  Fortunately, she is protective of her airway with her strategies. Factors that may increase risk of adverse event in presence of aspiration ([place] 2021): Frail or deconditioned;Inadequate oral hygiene;Reduced saliva;Poor general health and/or compromised immunity;Respiratory or GI disease Swallow Evaluation Recommendations Recommendations: PO diet PO Diet Recommendation: Dysphagia 3 (Mechanical soft);Regular;Thin liquids (Level 0) Liquid Administration via: Cup;Straw Medication Administration: Whole meds with liquid Supervision: Patient able to self-feed Swallowing strategies  : Minimize environmental distractions;Slow rate;Small bites/sips;Follow solids with liquids;Start intake with liquids; Multiple swallows Postural changes: Position pt fully upright for meals;Stay upright 30-60 min after meals Oral care recommendations: Oral care BID (2x/day) Factors that may increase risk of adverse event in presence of aspiration ([place] 2021): Factors that may increase risk of adverse event in presence of aspiration ([place] 2021): Frail or deconditioned;  Inadequate oral hygiene;  Reduced saliva; Poor general health and/or compromised immunity; Respiratory or GI disease Treatment Plan Treatment Plan Treatment recommendations: Therapy as outlined in treatment plan below Follow-up recommendations: No SLP follow up Functional status assessment: Patient has had a recent decline in their functional status and/or demonstrates limited ability to make significant improvements in function in a reasonable and predictable amount of time. Treatment frequency: Min 1x/week Treatment duration: 1 week Interventions: Aspiration precaution training; Patient/family education; Trials of upgraded texture/liquids; Diet toleration management by SLP Recommendations Recommendations for follow up therapy are one component of a multi-disciplinary discharge planning process, led by the attending physician.  Recommendations may be updated based on patient status, additional functional criteria and insurance authorization. Assessment: Orofacial Exam: Orofacial Exam Oral Cavity: Oral Hygiene: Xerostomia; Erythema; Lingual coating Oral Cavity - Dentition: Dentures, top; Dentures, bottom Orofacial Anatomy: WFL Oral Motor/Sensory Function: Generalized oral weakness (impaired mandibular ROM and lingual protrusion iatrogenic effects of XRT) Anatomy: Anatomy: WFL Thin Liquids: Thin Liquids (Level 0) Thin Liquids : Impaired Thin Liquid - Impairment: Pharyngeal impairment Lip Closure: No labial escape Tongue control during bolus hold: Cohesive bolus between tongue to palatal seal Bolus transport/lingual motion: Brisk tongue motion Oral residue: Trace residue lining oral structures Location of oral residue : Tongue Initiation of swallow : Pyriform sinuses Soft palate elevation: No bolus between soft palate (SP)/pharyngeal wall (PW) Laryngeal elevation: Complete superior movement of thyroid cartilage with complete approximation of arytenoids to epiglottic petiole Anterior hyoid excursion: Complete Epiglottic  movement: Complete Laryngeal vestibule closure: Complete, no air/contrast in laryngeal vestibule Pharyngeal stripping wave : Present - complete Pharyngoesophageal segment opening: Complete distension and complete duration, no obstruction of flow Tongue base retraction: Trace column of contrast or air between tongue base and PPW Pharyngeal residue: Trace residue within or on pharyngeal structures Location of pharyngeal residue: Diffuse (>3 areas) Penetration/Aspiration Scale (PAS) score: 1.  Material does not enter airway  Mildly Thick Liquids: Mildly thick liquids (Level 2, nectar thick) Mildly thick liquids (Level 2, nectar thick): Impaired Mildly Thick Liquid - Impairment: Pharyngeal impairment Lip Closure: No labial escape Bolus transport/lingual motion: Brisk tongue motion Oral residue: Trace residue lining oral structures Location of oral residue : Tongue Initiation of swallow : Valleculae Soft palate elevation: No bolus between soft palate (SP)/pharyngeal wall (PW) Laryngeal elevation: Complete superior movement of thyroid cartilage with complete approximation of arytenoids to epiglottic petiole Anterior hyoid excursion: Complete Epiglottic movement: Complete Laryngeal vestibule closure: Complete, no air/contrast in laryngeal vestibule Pharyngeal stripping wave : Present - diminished Pharyngeal contraction (A/P view only): Unilateral bulging Pharyngoesophageal segment opening: Complete distension and complete duration, no obstruction of flow Tongue base retraction: Narrow column of contrast or air between tongue base and PPW Pharyngeal residue: Trace residue within or on pharyngeal structures Location of pharyngeal residue: Diffuse (>3 areas) Penetration/Aspiration Scale (PAS) score: 1.  Material does not enter airway  Moderately Thick Liquids: Moderately thick liquids (Level 3, honey thick) Moderately thick liquids (Level 3, honey thick): Impaired Bolus delivery method: Spoon Moderately Thick Liquid -  Impairment: Pharyngeal impairment Lip Closure: No labial escape Tongue control during bolus hold: Cohesive bolus between tongue to palatal seal Bolus transport/lingual motion: Brisk tongue motion Oral residue: Trace residue lining oral structures Location of oral residue : Tongue Initiation of swallow : Valleculae Soft palate elevation: No bolus between soft palate (SP)/pharyngeal wall (PW) Laryngeal elevation: Complete superior movement of thyroid cartilage with complete approximation of arytenoids to epiglottic petiole Anterior hyoid excursion: Complete Epiglottic movement: Complete Laryngeal vestibule closure: Complete,  no air/contrast in laryngeal vestibule Pharyngeal stripping wave : Present - diminished Pharyngoesophageal segment opening: Partial distention/partial duration, partial obstruction of flow Tongue base retraction: Narrow column of contrast or air between tongue base and PPW Pharyngeal residue: Trace residue within or on pharyngeal structures Location of pharyngeal residue: Diffuse (>3 areas) Penetration/Aspiration Scale (PAS) score: 1.  Material does not enter airway  Puree: Puree Puree: Impaired Puree - Impairment: Oral Impairment; Pharyngeal impairment Lip Closure: No labial escape Bolus transport/lingual motion: Brisk tongue motion Oral residue: Trace residue lining oral structures Location of oral residue : Tongue Initiation of swallow: Valleculae Soft palate elevation: No bolus between soft palate (SP)/pharyngeal wall (PW) Laryngeal elevation: Partial superior movement of thyroid cartilage/partial approximation of arytenoids to epiglottic petiole Anterior hyoid excursion: Complete Epiglottic movement: Complete Laryngeal vestibule closure: Incomplete, narrow column air/contrast in laryngeal vestibule Pharyngeal stripping wave : Present - complete Pharyngoesophageal segment opening: Partial distention/partial duration, partial obstruction of flow Tongue base retraction: Wide column of contrast  or air between tongue base and PPW Pharyngeal residue: Collection of residue within or on pharyngeal structures Location of pharyngeal residue: Valleculae; Tongue base; Diffuse (>3 areas); Pharyngeal wall Penetration/Aspiration Scale (PAS) score: 1.  Material does not enter airway Solid: Solid Solid: Impaired Solid - Impairment: Oral Impairment; Pharyngeal impairment Lip Closure: No labial escape Bolus preparation/mastication: Disorganized chewing/mashing with solid pieces of bolus unchewed Bolus transport/lingual motion: Slow tongue motion Oral residue: Residue collection on oral structures Location of oral residue : Tongue Initiation of swallow: Valleculae Soft palate elevation: No bolus between soft palate (SP)/pharyngeal wall (PW) Laryngeal elevation: Complete superior movement of thyroid cartilage with complete approximation of arytenoids to epiglottic petiole Anterior hyoid excursion: Complete Epiglottic movement: Partial Laryngeal vestibule closure: Complete, no air/contrast in laryngeal vestibule Pharyngeal stripping wave : Present - diminished Pharyngoesophageal segment opening: Partial distention/partial duration, partial obstruction of flow (pt with impaired timing of closure suspected with retention below PES) Tongue base retraction: Wide column of contrast or air between tongue base and PPW Pharyngeal residue: Collection of residue within or on pharyngeal structures Location of pharyngeal residue: Tongue base; Valleculae; Pharyngeal wall Penetration/Aspiration Scale (PAS) score: 1.  Material does not enter airway Pill: Pill Pill: Not Tested Compensatory Strategies: Compensatory Strategies Compensatory strategies: Yes Chin tuck: Effective Effective Chin Tuck: Puree Left head turn: Ineffective Right head turn: Ineffective   General Information: No data recorded Diet Prior to this Study: Regular; Thin liquids (Level 0)   Temperature : Normal   Respiratory Status: WFL   Supplemental O2: Nasal cannula    History of Recent Intubation: No  Behavior/Cognition: Alert; Cooperative; Pleasant mood Self-Feeding Abilities: Able to self-feed Baseline vocal quality/speech: Dysphonic No data recorded Volitional Swallow: Able to elicit Exam Limitations: No limitations Goal Planning: Prognosis for improved oropharyngeal function: Fair Barriers to Reach Goals: Time post onset No data recorded Patient/Family Stated Goal: pt admits to dysphagia with solids - Consulted and agree with results and recommendations: Patient Pain: Pain Assessment Pain Assessment: No/denies pain Pain Score: 1 Breathing: 0 Negative Vocalization: 0 Facial Expression: 0 Body Language: 0 Consolability: 0 PAINAD Score: 0 Pain Location: discomfort on IV site on back of L hand Pain Descriptors / Indicators: Discomfort End of Session: Start Time:SLP Start Time (ACUTE ONLY): 1601 Stop Time: SLP Stop Time (ACUTE ONLY): 1630 Time Calculation:SLP Time Calculation (min) (ACUTE ONLY): 29 min Charges: SLP Evaluations $ SLP Speech Visit: 1 Visit SLP Evaluations $BSS Swallow: 1 Procedure SLP visit diagnosis: SLP Visit Diagnosis: Dysphagia, oropharyngeal phase (R13.12) Past  Medical History: Past Medical History: Diagnosis Date  Hypertension   Stroke [facility])   Thyroid disease  Past Surgical History: Past Surgical History: Procedure Laterality Date  ABDOMINAL HYSTERECTOMY    bowel obstruction    BREAST EXCISIONAL BIOPSY Right 20+ yrs ago  benign  INTRAMEDULLARY (IM) NAIL INTERTROCHANTERIC Left 10/11/2020  Procedure: INTRAMEDULLARY (IM) NAIL INTERTROCHANTRIC;  Surgeon: Shona Needles, MD;  Location: [facility];  Service: Orthopedics;  Laterality: Left;  SHOULDER SURGERY   Macario Golds 07/19/2022, 9:54 AM Kathleen Lime, MS [facility] SLP Acute Rehab Services Office [phone]      Estill Cotta M.D. [facility] 07/19/2022, 2:05 PM  Available via Epic secure chat 7am-7pm After 7 pm, please refer to night coverage provider listed on amion.

## 2022-07-19 NOTE — Plan of Care (Signed)
  Problem: Education: Goal: Knowledge of General Education information will improve Description: Including pain rating scale, medication(s)/side effects and non-pharmacologic comfort measures 07/19/2022 0423 by Normand Sloop, RN Outcome: Progressing 07/19/2022 0423 by Normand Sloop, RN Outcome: Progressing   Problem: Health Behavior/Discharge Planning: Goal: Ability to manage health-related needs will improve 07/19/2022 0423 by Normand Sloop, RN Outcome: Progressing 07/19/2022 0423 by Normand Sloop, RN Outcome: Progressing   Problem: Clinical Measurements: Goal: Ability to maintain clinical measurements within normal limits will improve 07/19/2022 0423 by Normand Sloop, RN Outcome: Progressing 07/19/2022 0423 by Normand Sloop, RN Outcome: Progressing Goal: Will remain free from infection 07/19/2022 0423 by Normand Sloop, RN Outcome: Progressing 07/19/2022 0423 by Normand Sloop, RN Outcome: Progressing Goal: Diagnostic test results will improve 07/19/2022 0423 by Normand Sloop, RN Outcome: Progressing 07/19/2022 0423 by Normand Sloop, RN Outcome: Progressing Goal: Respiratory complications will improve 07/19/2022 0423 by Normand Sloop, RN Outcome: Progressing 07/19/2022 0423 by Normand Sloop, RN Outcome: Progressing Goal: Cardiovascular complication will be avoided 07/19/2022 0423 by Normand Sloop, RN Outcome: Progressing 07/19/2022 0423 by Normand Sloop, RN Outcome: Progressing   Problem: Activity: Goal: Risk for activity intolerance will decrease 07/19/2022 0423 by Normand Sloop, RN Outcome: Progressing 07/19/2022 0423 by Normand Sloop, RN Outcome: Progressing   Problem: Nutrition: Goal: Adequate nutrition will be maintained 07/19/2022 0423 by Normand Sloop, RN Outcome: Progressing 07/19/2022 0423 by Normand Sloop, RN Outcome: Progressing   Problem: Coping: Goal: Level of anxiety will decrease 07/19/2022 0423 by Normand Sloop, RN Outcome:  Progressing 07/19/2022 0423 by Normand Sloop, RN Outcome: Progressing   Problem: Elimination: Goal: Will not experience complications related to bowel motility 07/19/2022 0423 by Normand Sloop, RN Outcome: Progressing 07/19/2022 0423 by Normand Sloop, RN Outcome: Progressing Goal: Will not experience complications related to urinary retention Outcome: Progressing   Problem: Pain Managment: Goal: General experience of comfort will improve Outcome: Progressing   Problem: Safety: Goal: Ability to remain free from injury will improve Outcome: Progressing   Problem: Skin Integrity: Goal: Risk for impaired skin integrity will decrease Outcome: Progressing

## 2022-07-20 ENCOUNTER — Other Ambulatory Visit (HOSPITAL_COMMUNITY): Payer: Self-pay

## 2022-07-20 ENCOUNTER — Inpatient Hospital Stay (HOSPITAL_COMMUNITY): Payer: Medicare Other

## 2022-07-20 DIAGNOSIS — J441 Chronic obstructive pulmonary disease with (acute) exacerbation: Secondary | ICD-10-CM | POA: Diagnosis not present

## 2022-07-20 DIAGNOSIS — Z720 Tobacco use: Secondary | ICD-10-CM | POA: Diagnosis not present

## 2022-07-20 DIAGNOSIS — J4489 Other specified chronic obstructive pulmonary disease: Secondary | ICD-10-CM | POA: Diagnosis not present

## 2022-07-20 DIAGNOSIS — R0902 Hypoxemia: Secondary | ICD-10-CM | POA: Diagnosis not present

## 2022-07-20 LAB — BRAIN NATRIURETIC PEPTIDE: B Natriuretic Peptide: 61.3 pg/mL (ref 0.0–100.0)

## 2022-07-20 MED ORDER — METRONIDAZOLE 500 MG PO TABS: 500.0000 mg | ORAL_TABLET | Freq: Two times a day (BID) | ORAL | 0 refills | Status: AC

## 2022-07-20 MED ORDER — METRONIDAZOLE 500 MG PO TABS
500.0000 mg | ORAL_TABLET | Freq: Once | ORAL | Status: AC
  Administered 2022-07-20: 500 mg via ORAL
  Filled 2022-07-20: qty 1

## 2022-07-20 MED ORDER — ASPIRIN 81 MG PO TBEC: 81.0000 mg | DELAYED_RELEASE_TABLET | Freq: Every day | ORAL | 11 refills | Status: AC

## 2022-07-20 MED ORDER — ONDANSETRON HCL 4 MG PO TABS: 4.0000 mg | ORAL_TABLET | Freq: Three times a day (TID) | ORAL | 0 refills | Status: DC | PRN

## 2022-07-20 MED ORDER — FUROSEMIDE 40 MG PO TABS: 20.0000 mg | ORAL_TABLET | Freq: Once | ORAL | Status: DC

## 2022-07-20 MED ORDER — FLUCONAZOLE 100 MG PO TABS: 100.0000 mg | ORAL_TABLET | Freq: Every day | ORAL | 0 refills | Status: AC

## 2022-07-20 MED ORDER — NYSTATIN 100000 UNIT/ML MT SUSP: 5.0000 mL | Freq: Four times a day (QID) | OROMUCOSAL | 0 refills | Status: AC

## 2022-07-20 MED ORDER — METRONIDAZOLE 500 MG PO TABS
500.0000 mg | ORAL_TABLET | Freq: Two times a day (BID) | ORAL | Status: DC
  Administered 2022-07-20 – 2022-07-21 (×2): 500 mg via ORAL
  Filled 2022-07-20 (×2): qty 1

## 2022-07-20 MED ORDER — GUAIFENESIN ER 600 MG PO TB12: 600.0000 mg | ORAL_TABLET | Freq: Two times a day (BID) | ORAL | 0 refills | Status: AC

## 2022-07-20 MED ORDER — LEVOFLOXACIN 750 MG PO TABS
750.0000 mg | ORAL_TABLET | Freq: Every day | ORAL | Status: DC
  Administered 2022-07-20 – 2022-07-21 (×2): 750 mg via ORAL
  Filled 2022-07-20 (×2): qty 1

## 2022-07-20 MED ORDER — FUROSEMIDE 40 MG PO TABS
40.0000 mg | ORAL_TABLET | Freq: Once | ORAL | Status: AC
  Administered 2022-07-20: 40 mg via ORAL
  Filled 2022-07-20: qty 1

## 2022-07-20 MED ORDER — CEFPODOXIME PROXETIL 200 MG PO TABS: 200.0000 mg | ORAL_TABLET | Freq: Two times a day (BID) | ORAL | 0 refills | Status: DC

## 2022-07-20 MED ORDER — DIAZEPAM 5 MG PO TABS
5.0000 mg | ORAL_TABLET | Freq: Every evening | ORAL | Status: AC | PRN
  Administered 2022-07-20: 5 mg via ORAL
  Filled 2022-07-20: qty 1

## 2022-07-20 NOTE — Progress Notes (Signed)
Physical Therapy Treatment Patient Details Name: Melody White MRN: [id number] DOB: [birth year] Today's Date: 07/20/2022   History of Present Illness Patient is a 72 year old female who presented with 2 day history of shortness of breath with O2 noted to be 85% on room air. Patient was found to have COPD exacerbation.  PMH: L IM nail 5/22, HTN, thyroid disease, stroke    PT Comments    Pt ambulated 148f, and 3032fwithout use of AD and modified independence and performed 2 steps with supervision. Pt on 4L with O2 sats 90-92% at rest. Pt with O2 low of 88% while on 3L and 4L during mobility, noted increased time for reocvery to 90% and > on 3L. Pt reports that her O2 typically does not go above 92%, but she does not use supplemental O2 at baseline. Pt left on 3L O2 at 91% at end of session. Pt has progressed to supervision-independent for all mobility. No further skilled PT needs identified at this time. PT will sign off. Please reconsult if mobility status changes.     Recommendations for follow up therapy are one component of a multi-disciplinary discharge planning process, led by the attending physician.  Recommendations may be updated based on patient status, additional functional criteria and insurance authorization.  Follow Up Recommendations  No PT follow up     Assistance Recommended at Discharge PRN  Patient can return home with the following     Equipment Recommendations  None recommended by PT    Recommendations for Other Services       Precautions / Restrictions Precautions Precautions: Other (comment) Precaution Comments: monitor O2 Restrictions Weight Bearing Restrictions: No     Mobility  Bed Mobility               General bed mobility comments: OOB in recliner    Transfers Overall transfer level: Independent                      Ambulation/Gait Ambulation/Gait assistance: Modified independent (Device/Increase time) Gait Distance (Feet): 150  Feet Assistive device: None Gait Pattern/deviations: Step-through pattern Gait velocity: WFL     General Gait Details: pt ambulated 15058fnd additional 300f54fllowing seated rest for O2 recovery. No overt LOB observed, reports LEs feel better/stronger.   Stairs Stairs: Yes Stairs assistance: Supervision Stair Management: One rail Left, Alternating pattern, Forwards Number of Stairs: 2 General stair comments: Pt reports she uses railing on stairs if going out onto her deck and HHA from spouse to get into home. HHA from PT provided for ascending stairs.   Wheelchair Mobility    Modified Rankin (Stroke Patients Only)       Balance Overall balance assessment: Modified Independent                                          Cognition Arousal/Alertness: Awake/alert Behavior During Therapy: WFL for tasks assessed/performed Overall Cognitive Status: Within Functional Limits for tasks assessed                                          Exercises      General Comments        Pertinent Vitals/Pain Pain Assessment Pain Assessment: No/denies pain    Home Living  Prior Function            PT Goals (current goals can now be found in the care plan section) Acute Rehab PT Goals Patient Stated Goal: Regain IND and dc home PT Goal Formulation: With patient Time For Goal Achievement: 07/30/22 Potential to Achieve Goals: Good Progress towards PT goals: Goals met/education completed, patient discharged from PT    Frequency    Other (Comment) (discharge)      PT Plan Current plan remains appropriate;Frequency needs to be updated    Co-evaluation              AM-PAC PT "6 Clicks" Mobility   Outcome Measure  Help needed turning from your back to your side while in a flat bed without using bedrails?: None Help needed moving from lying on your back to sitting on the side of a flat bed without  using bedrails?: None Help needed moving to and from a bed to a chair (including a wheelchair)?: None Help needed standing up from a chair using your arms (e.g., wheelchair or bedside chair)?: None Help needed to walk in hospital room?: None Help needed climbing 3-5 steps with a railing? : A Little 6 Click Score: 23    End of Session Equipment Utilized During Treatment: Gait belt;Oxygen Activity Tolerance: Patient tolerated treatment well Patient left: in chair;with call bell/phone within reach Nurse Communication: Mobility status (O2 sats) PT Visit Diagnosis: Muscle weakness (generalized) (M62.81)     Time: [id number] PT Time Calculation (min) (ACUTE ONLY): 28 min  Charges:  $Therapeutic Activity: 23-37 mins                     Festus Barren PT, DPT  Acute Rehabilitation Services  Office [phone]  07/20/2022, 4:14 PM

## 2022-07-20 NOTE — Discharge Summary (Addendum)
Physician Discharge Summary   Patient: Melody White MRN: [id number] DOB: 10-09-1950  Admit date:     07/15/2022  Discharge date: 07/20/22  Discharge Physician: Estill Cotta, MD    PCP: Aletha Halim., PA-C   Recommendations at discharge:   Continue Vantin 200 mg p.o. twice daily for 3 more days Continue Flagyl 500 mg twice daily for 3 more days Home O2 evaluation completed, requires 3 L O2 with ambulation  SATURATION QUALIFICATIONS: (This note is used to comply with regulatory documentation for home oxygen)   Patient Saturations on Room Air at Rest = 91%   Patient Saturations on Room Air while Ambulating = 83%   Patient Saturations on 3 Liters of oxygen while Ambulating = 94%   Please briefly explain why patient needs home oxygen: pt unable to maintain O2 saturation greater than 92% on room air.  4.  Ambulatory referral to pulmonology sent  Discharge Diagnoses:     Acute respiratory failure with hypoxia ([place])   Tobacco use   Hypothyroidism   COPD with acute exacerbation ([place]) Persistent collapse in the right middle lobe    Hospital Course:  Patient is a 72 year old female with hypothyroidism, TIA, COPD, current smoker 1 pack of cigarettes lasts 2 days, who presented to the hospital due to generalized weakness, with poor appetite and oral intake, found to have hypoxia.  Endorses having trouble swallowing solid foods.  Per Husband, she has been laying around for the last 4days, not getting up.  Workup in the ED revealed COPD exacerbation with a congested cough and hypoxia.  Not on oxygen supplementation at baseline.  CTA chest showed no PE however revealed persistent collapse in the right middle lobe with attenuation of the right middle lobe bronchus.   Assessment and Plan:  Acute respiratory failure with hypoxia secondary to COPD exacerbation. -CTA chest negative for pulmonary embolism, has right middle lobe collapse, noted similar to prior exam, emphysema. -Placed on  IV Rocephin and Flagyl - transition to oral Vantin and Flagyl for 3 more days to complete full course -Home O2 evaluation completed, needs 3 L on ambulation. -Outpatient ambulatory referral to pulmonology sent for follow-up. Wean O2 as tolerated, explained to the patient, has a pulse ox at home.  I's and O's with 3 L positive, will give 1 dose of oral Lasix prior to discharge today.  No peripheral edema.  Patient was recommended to stay 1 more day given she still has hypoxia on 3 L with ambulation, however patient requested to be discharged home today as she feels better.  Persistent collapse in the right middle lobe with attenuation of right middle lobe bronchus -Unclear etiology, recommended to complete full course of antibiotics, outpatient referral to pulmonology sent.   History of TIA - no acute issues, continue aspirin, Lipitor   Hypothyroidism -Continue Synthroid, TSH 0.17 on 3/4   Dysphagia -Underwent MBS and was recommended dysphagia 3 diet with thin liquids   Cholelithiasis -Incidental finding on CT chest, currently no acute issues, no abdominal pain, nausea vomiting, LFTs normal.  Tolerating diet.   Tobacco use -Currently smoking 0.5 pack/day -Smoking cessation recommended   Moderate protein calorie malnutrition Nutrition Problem: Increased nutrient needs Etiology: chronic illness Interventions: Ensure Enlive (each supplement provides 350kcal and 20 grams of protein)   Estimated body mass index is 17.72 kg/m as calculated from the following:   Height as of this encounter: '5\' 6"'$  (1.676 m).   Weight as of this encounter: 49.8 kg.  Pain control - [employer] Controlled Substance Reporting System database was reviewed. and patient was instructed, not to drive, operate heavy machinery, perform activities at heights, swimming or participation in water activities or provide baby-sitting services while on Pain, Sleep and Anxiety Medications; until their outpatient  Physician has advised to do so again. Also recommended to not to take more than prescribed Pain, Sleep and Anxiety Medications.  Consultants: None Procedures performed: MBS Disposition: Home Diet recommendation:  Soft diet DISCHARGE MEDICATION: Allergies as of 07/20/2022       Reactions   Amoxicillin Other (See Comments)   other   Azithromycin Itching   Clindamycin Other (See Comments)   Funny feeling other   Codeine Itching, Other (See Comments)   other   Fexofenadine Other (See Comments)   Funny feeling other   Hydrocodone Bit-homatrop Mbr Itching   Sulfamethoxazole Nausea Only, Other (See Comments)   other        Medication List     TAKE these medications    acetaminophen 325 MG tablet Commonly known as: TYLENOL Take 1-2 tablets (325-650 mg total) by mouth every 6 (six) hours as needed for mild pain or moderate pain (pain score 1-3 or temp > 100.5).   albuterol 108 (90 Base) MCG/ACT inhaler Commonly known as: VENTOLIN HFA Inhale 2 puffs into the lungs every 6 (six) hours as needed for wheezing or shortness of breath.   alendronate 70 MG tablet Commonly known as: FOSAMAX Take 70 mg by mouth once a week.   aspirin EC 81 MG tablet Take 1 tablet (81 mg total) by mouth daily. Swallow whole.   atorvastatin 20 MG tablet Commonly known as: LIPITOR Take 1 tablet (20 mg total) by mouth daily.   budesonide-formoterol 160-4.5 MCG/ACT inhaler Commonly known as: SYMBICORT Inhale 2 puffs into the lungs 2 (two) times daily.   calcium carbonate 1500 (600 Ca) MG Tabs tablet Commonly known as: Calcium 600 Take 1 tablet (1,500 mg total) by mouth 2 (two) times daily with a meal.   cefpodoxime 200 MG tablet Commonly known as: VANTIN Take 1 tablet (200 mg total) by mouth 2 (two) times daily for 3 days.   Cholecalciferol 25 MCG (1000 UT) tablet Take 1 tablet (1,000 Units total) by mouth daily.   Compressor/Nebulizer Misc 1 Units by Does not apply route as directed.    diazepam 10 MG tablet Commonly known as: VALIUM Take 10 mg by mouth at bedtime as needed for anxiety or sleep. Into the vagina every night as needed for pelvic floor spasms   escitalopram 20 MG tablet Commonly known as: LEXAPRO Take 1 tablet (20 mg total) by mouth daily.   fluconazole 100 MG tablet Commonly known as: Diflucan Take 1 tablet (100 mg total) by mouth daily for 10 days.   guaiFENesin 600 MG 12 hr tablet Commonly known as: MUCINEX Take 1 tablet (600 mg total) by mouth 2 (two) times daily for 7 days.   ipratropium-albuterol 0.5-2.5 (3) MG/3ML Soln Commonly known as: DUONEB Take 3 mLs by nebulization 3 (three) times daily.   levothyroxine 75 MCG tablet Commonly known as: SYNTHROID Take 1 tablet (75 mcg total) by mouth daily.   metroNIDAZOLE 500 MG tablet Commonly known as: Flagyl Take 1 tablet (500 mg total) by mouth 2 (two) times daily for 3 days.   montelukast 10 MG tablet Commonly known as: SINGULAIR Take 1 tablet (10 mg total) by mouth at bedtime.   multivitamin capsule Take 1 capsule by mouth daily.  nystatin 100000 UNIT/ML suspension Commonly known as: MYCOSTATIN Take 5 mLs (500,000 Units total) by mouth 4 (four) times daily for 7 days.   ondansetron 4 MG tablet Commonly known as: Zofran Take 1 tablet (4 mg total) by mouth every 8 (eight) hours as needed for nausea or vomiting.   VITAMIN B COMPLEX PO Take 1 tablet by mouth daily.        Follow-up Information     Aletha Halim., PA-C Follow up in 1 week(s).   Specialty: Family Medicine Why: hospital discharge follow up, pcp to refer you to a pulmonologist to have lung function test and COPD management Contact information: [place] [address] [phone]         [facility] Follow up.   Contact information: [place] [address] [phone]                Discharge Exam: Filed Weights   07/17/22 0528 07/18/22 0424   Weight: 53.4 kg 49.8 kg   S: No acute complaints, no fevers, no chest pain.  Coughing and shortness of breath improving, wants to go home.   BP 135/80   Pulse (!) 58   Temp 97.6 F (36.4 C) (Oral)   Resp 19   Ht '5\' 6"'$  (1.676 m)   Wt 49.8 kg   SpO2 91%   BMI 17.72 kg/m   Physical Exam General: Alert and oriented x 3, NAD Cardiovascular: S1 S2 clear, RRR.  Respiratory: Diminished breath sound at the bases otherwise clear Gastrointestinal: Soft, nontender, nondistended, NBS Ext: no pedal edema bilaterally Neuro: no new deficits Psych: Normal affect    Condition at discharge: fair  The results of significant diagnostics from this hospitalization (including imaging, microbiology, ancillary and laboratory) are listed below for reference.   Imaging Studies: DG Swallowing Func-Speech Pathology  Result Date: 07/19/2022 Table formatting from the original result was not included. 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  Swallow eval ordered by Dr Nevada Crane.  Pt is s/p esophagram that showed mild dysmotlity - however SLP has concerns for pharyngeal motility issues given XRT. Pt reports her dysphagia may contribute to her weight loss - as she reports she stops eating solids due to having to regurgitate. She advised that she feels food "sticking" and then it will "come back up". She states the food and drink come back up together. Clinical Impression Patient presents with moderate oropharyngeal-cervical esophageal dysphagia due to iatrogenic effects of XRT from 2004.  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  Fortunately, patient  senses retention and uses liquids and dry swallows to clear.  No aspiration or laryngeal penetration observed during entire study even with sequential swallows. Laryngeal elevation/closure is excellent.    Impaired tongue base retraction, pharyngeal motility allows vallecular more than pyriform sinus retention- much more impaired with solids than liquids.  At one time, she sensed retention in distal pharynx - when her pharynx appeared clear.  PES opening in this patient appears impaired including timing and adequacy of opening causing near retrograde propulsion from proximal esophagus into pharynx. Liquid was again effective to decrease  retention. Chin tuck posture decreased amount of retention with puree.   Recommend continue diet with strict use of compensation strategies, SLP educated pt to findings/recommendations using teach back.  As patient reports her dysphagia has contributed to weight loss, she may benefit from ENT referral as an OP to determine if PES intervention may be warranted to help maximize her nutrition.  Fortunately, she is protective of her airway with her strategies. Factors that may increase risk of adverse event in presence of aspiration ([place] 2021): Frail or deconditioned;Inadequate oral hygiene;Reduced saliva;Poor general health and/or compromised immunity;Respiratory or GI disease Swallow Evaluation Recommendations Recommendations: PO diet PO Diet Recommendation: Dysphagia 3 (Mechanical soft);Regular;Thin liquids (Level 0) Liquid Administration via: Cup;Straw Medication Administration: Whole meds with liquid Supervision: Patient able to self-feed Swallowing strategies  : Minimize environmental distractions;Slow rate;Small bites/sips;Follow solids with liquids;Start intake with liquids; Multiple swallows Postural changes: Position pt fully upright for meals;Stay upright 30-60 min after meals Oral care recommendations: Oral care BID (2x/day) Factors that may increase risk of  adverse event in presence of aspiration ([place] 2021): Factors that may increase risk of adverse event in presence of aspiration ([place] 2021): Frail or deconditioned; Inadequate oral hygiene; Reduced saliva; Poor general health and/or compromised immunity; Respiratory or GI disease Treatment Plan Treatment Plan Treatment recommendations: Therapy as outlined in treatment plan below Follow-up recommendations: No SLP follow up Functional status assessment: Patient has had a recent decline in their functional status and/or demonstrates limited ability to make significant improvements in function in a reasonable and predictable amount of time. Treatment frequency: Min 1x/week Treatment duration: 1 week Interventions: Aspiration precaution training; Patient/family education; Trials of upgraded texture/liquids; Diet toleration management by SLP Recommendations Recommendations for follow up therapy are one component of a multi-disciplinary discharge planning process, led by the attending physician.  Recommendations may be updated based on patient status, additional functional criteria and insurance authorization. Assessment: Orofacial Exam: Orofacial Exam Oral Cavity: Oral Hygiene: Xerostomia; Erythema; Lingual coating Oral Cavity - Dentition: Dentures, top; Dentures, bottom Orofacial Anatomy: WFL Oral Motor/Sensory Function: Generalized oral weakness (impaired mandibular ROM and lingual protrusion iatrogenic effects of XRT) Anatomy: Anatomy: WFL Thin Liquids: Thin Liquids (Level 0) Thin Liquids : Impaired Thin Liquid - Impairment: Pharyngeal impairment Lip Closure: No labial escape Tongue control during bolus hold: Cohesive bolus between tongue to palatal seal Bolus transport/lingual motion: Brisk tongue motion Oral residue: Trace residue lining oral structures Location of oral residue : Tongue Initiation of swallow : Pyriform sinuses Soft palate elevation: No bolus between soft palate (SP)/pharyngeal  wall (PW) Laryngeal elevation: Complete superior movement of thyroid cartilage with complete approximation of arytenoids to epiglottic petiole Anterior hyoid excursion: Complete Epiglottic movement: Complete Laryngeal vestibule closure: Complete, no air/contrast in laryngeal vestibule Pharyngeal stripping wave : Present - complete Pharyngoesophageal segment opening: Complete distension and complete duration, no obstruction of flow Tongue base retraction: Trace column of contrast or air between tongue base and PPW Pharyngeal residue: Trace residue within or on pharyngeal structures Location of pharyngeal residue: Diffuse (>3 areas) Penetration/Aspiration Scale (PAS) score: 1.  Material does not enter airway  Mildly Thick Liquids: Mildly thick liquids (Level 2, nectar thick) Mildly thick liquids (Level 2, nectar thick): Impaired Mildly Thick Liquid - Impairment: Pharyngeal impairment Lip Closure: No labial escape Bolus transport/lingual motion: Brisk tongue motion Oral residue: Trace residue lining oral structures Location of oral residue : Tongue Initiation of swallow : Valleculae Soft palate elevation: No bolus between soft palate (SP)/pharyngeal wall (  PW) Laryngeal elevation: Complete superior movement of thyroid cartilage with complete approximation of arytenoids to epiglottic petiole Anterior hyoid excursion: Complete Epiglottic movement: Complete Laryngeal vestibule closure: Complete, no air/contrast in laryngeal vestibule Pharyngeal stripping wave : Present - diminished Pharyngeal contraction (A/P view only): Unilateral bulging Pharyngoesophageal segment opening: Complete distension and complete duration, no obstruction of flow Tongue base retraction: Narrow column of contrast or air between tongue base and PPW Pharyngeal residue: Trace residue within or on pharyngeal structures Location of pharyngeal residue: Diffuse (>3 areas) Penetration/Aspiration Scale (PAS) score: 1.  Material does not enter airway   Moderately Thick Liquids: Moderately thick liquids (Level 3, honey thick) Moderately thick liquids (Level 3, honey thick): Impaired Bolus delivery method: Spoon Moderately Thick Liquid - Impairment: Pharyngeal impairment Lip Closure: No labial escape Tongue control during bolus hold: Cohesive bolus between tongue to palatal seal Bolus transport/lingual motion: Brisk tongue motion Oral residue: Trace residue lining oral structures Location of oral residue : Tongue Initiation of swallow : Valleculae Soft palate elevation: No bolus between soft palate (SP)/pharyngeal wall (PW) Laryngeal elevation: Complete superior movement of thyroid cartilage with complete approximation of arytenoids to epiglottic petiole Anterior hyoid excursion: Complete Epiglottic movement: Complete Laryngeal vestibule closure: Complete, no air/contrast in laryngeal vestibule Pharyngeal stripping wave : Present - diminished Pharyngoesophageal segment opening: Partial distention/partial duration, partial obstruction of flow Tongue base retraction: Narrow column of contrast or air between tongue base and PPW Pharyngeal residue: Trace residue within or on pharyngeal structures Location of pharyngeal residue: Diffuse (>3 areas) Penetration/Aspiration Scale (PAS) score: 1.  Material does not enter airway  Puree: Puree Puree: Impaired Puree - Impairment: Oral Impairment; Pharyngeal impairment Lip Closure: No labial escape Bolus transport/lingual motion: Brisk tongue motion Oral residue: Trace residue lining oral structures Location of oral residue : Tongue Initiation of swallow: Valleculae Soft palate elevation: No bolus between soft palate (SP)/pharyngeal wall (PW) Laryngeal elevation: Partial superior movement of thyroid cartilage/partial approximation of arytenoids to epiglottic petiole Anterior hyoid excursion: Complete Epiglottic movement: Complete Laryngeal vestibule closure: Incomplete, narrow column air/contrast in laryngeal vestibule Pharyngeal  stripping wave : Present - complete Pharyngoesophageal segment opening: Partial distention/partial duration, partial obstruction of flow Tongue base retraction: Wide column of contrast or air between tongue base and PPW Pharyngeal residue: Collection of residue within or on pharyngeal structures Location of pharyngeal residue: Valleculae; Tongue base; Diffuse (>3 areas); Pharyngeal wall Penetration/Aspiration Scale (PAS) score: 1.  Material does not enter airway Solid: Solid Solid: Impaired Solid - Impairment: Oral Impairment; Pharyngeal impairment Lip Closure: No labial escape Bolus preparation/mastication: Disorganized chewing/mashing with solid pieces of bolus unchewed Bolus transport/lingual motion: Slow tongue motion Oral residue: Residue collection on oral structures Location of oral residue : Tongue Initiation of swallow: Valleculae Soft palate elevation: No bolus between soft palate (SP)/pharyngeal wall (PW) Laryngeal elevation: Complete superior movement of thyroid cartilage with complete approximation of arytenoids to epiglottic petiole Anterior hyoid excursion: Complete Epiglottic movement: Partial Laryngeal vestibule closure: Complete, no air/contrast in laryngeal vestibule Pharyngeal stripping wave : Present - diminished Pharyngoesophageal segment opening: Partial distention/partial duration, partial obstruction of flow (pt with impaired timing of closure suspected with retention below PES) Tongue base retraction: Wide column of contrast or air between tongue base and PPW Pharyngeal residue: Collection of residue within or on pharyngeal structures Location of pharyngeal residue: Tongue base; Valleculae; Pharyngeal wall Penetration/Aspiration Scale (PAS) score: 1.  Material does not enter airway Pill: Pill Pill: Not Tested Compensatory Strategies: Compensatory Strategies Compensatory strategies: Yes Chin tuck: Effective Effective  Chin Tuck: Puree Left head turn: Ineffective Right head turn: Ineffective    General Information: No data recorded Diet Prior to this Study: Regular; Thin liquids (Level 0)   Temperature : Normal   Respiratory Status: WFL   Supplemental O2: Nasal cannula   History of Recent Intubation: No  Behavior/Cognition: Alert; Cooperative; Pleasant mood Self-Feeding Abilities: Able to self-feed Baseline vocal quality/speech: Dysphonic No data recorded Volitional Swallow: Able to elicit Exam Limitations: No limitations Goal Planning: Prognosis for improved oropharyngeal function: Fair Barriers to Reach Goals: Time post onset No data recorded Patient/Family Stated Goal: pt admits to dysphagia with solids - Consulted and agree with results and recommendations: Patient Pain: Pain Assessment Pain Assessment: No/denies pain Pain Score: 1 Breathing: 0 Negative Vocalization: 0 Facial Expression: 0 Body Language: 0 Consolability: 0 PAINAD Score: 0 Pain Location: discomfort on IV site on back of L hand Pain Descriptors / Indicators: Discomfort End of Session: Start Time:SLP Start Time (ACUTE ONLY): 1601 Stop Time: SLP Stop Time (ACUTE ONLY): 1630 Time Calculation:SLP Time Calculation (min) (ACUTE ONLY): 29 min Charges: SLP Evaluations $ SLP Speech Visit: 1 Visit SLP Evaluations $BSS Swallow: 1 Procedure SLP visit diagnosis: SLP Visit Diagnosis: Dysphagia, oropharyngeal phase (R13.12) Past Medical History: Past Medical History: Diagnosis Date  Hypertension   Stroke ([place])   Thyroid disease  Past Surgical History: Past Surgical History: Procedure Laterality Date  ABDOMINAL HYSTERECTOMY    bowel obstruction    BREAST EXCISIONAL BIOPSY Right 20+ yrs ago  benign  INTRAMEDULLARY (IM) NAIL INTERTROCHANTERIC Left 10/11/2020  Procedure: INTRAMEDULLARY (IM) NAIL INTERTROCHANTRIC;  Surgeon: Shona Needles, MD;  Location: [facility];  Service: Orthopedics;  Laterality: Left;  SHOULDER SURGERY   Macario Golds 07/19/2022, 9:54 AM Kathleen Lime, MS [facility] SLP Acute Rehab Services Office [phone]  DG ESOPHAGUS W SINGLE CM (SOL OR  THIN BA)  Result Date: 07/17/2022 CLINICAL DATA:  History of tobacco abuse, COPD and lymph node cancer (2010) admitted for shortness of breath. Patient complains of globus sensation and intermittent solid-food dysphagia with occasional regurgitation of food. Request received for fluoroscopic esophagram study. EXAM: ESOPHAGUS/BARIUM SWALLOW/TABLET STUDY TECHNIQUE: Single contrast examination was performed using thin liquid barium. This exam was performed by Narda Rutherford, NP, and was supervised and interpreted by Suzy Bouchard, MD. FLUOROSCOPY: Radiation Exposure Index (as provided by the fluoroscopic device): 10.20 mGy Kerma COMPARISON:  CT angio chest dated 07/16/2022 FINDINGS: Swallowing: Appears normal. No vestibular penetration or aspiration seen. Pharynx: Unremarkable. Esophagus: Normal appearance.  No mass or stricture. Esophageal motility: Mild delayed initiation of primary stripping wave. to and fro motion of barium in the esophagus. Hiatal Hernia: None. Gastroesophageal reflux: Gastroesophageal reflux not visualized with provocation maneuvers. Ingested 59m barium tablet: 13 mm barium tablet passed normally. Other: None. IMPRESSION: Mild mild dysmotility. Otherwise normal fluoroscopic esophagram study Read by: SNarda Rutherford AGNP-BC Electronically Signed   By: SSuzy BouchardM.D.   On: 07/17/2022 11:39   CT Angio Chest Pulmonary Embolism (PE) W or WO Contrast  Result Date: 07/16/2022 CLINICAL DATA:  Difficulty breathing EXAM: CT ANGIOGRAPHY CHEST WITH CONTRAST TECHNIQUE: Multidetector CT imaging of the chest was performed using the standard protocol during bolus administration of intravenous contrast. Multiplanar CT image reconstructions and MIPs were obtained to evaluate the vascular anatomy. RADIATION DOSE REDUCTION: This exam was performed according to the departmental dose-optimization program which includes automated exposure control, adjustment of the mA and/or kV according to patient size and/or  use of iterative reconstruction technique. CONTRAST:  711mOMNIPAQUE IOHEXOL  350 MG/ML SOLN COMPARISON:  CT from the previous day. FINDINGS: Cardiovascular: Atherosclerotic calcifications of the thoracic aorta are noted. No dilatation or dissection is seen. Pulmonary artery is well visualized within normal branching pattern. No intraluminal filling defect to suggest pulmonary embolism is noted. No significant coronary calcifications are noted. No cardiac enlargement is seen. Mediastinum/Nodes: Thoracic inlet is within normal limits. The esophagus is within normal limits. Linear density is again noted adjacent to the bronchus intermedius and takeoff of the right middle lobe bronchus. These changes may be postsurgical in nature. Attenuation of the right middle lobe bronchus is again seen with right middle lobe collapse. Lungs/Pleura: Right middle lobe collapse is noted similar to that seen on the prior exam. Attenuation of the middle lobe bronchus centrally is noted. Mild bibasilar atelectasis is noted. No focal parenchymal nodule is seen. Mild emphysematous changes are noted. Upper Abdomen: Visualized upper abdomen shows no acute abnormality. Musculoskeletal: No acute rib abnormality is seen. Degenerative changes of the thoracic spine are noted. Review of the MIP images confirms the above findings. IMPRESSION: Persistent collapse in the right middle lobe with attenuation of the right middle lobe bronchus. Linear density is noted adjacent to this and these changes may be postoperative in nature. Correlate with any surgical history. Persistent bibasilar atelectasis. No evidence of pulmonary emboli. Aortic Atherosclerosis (ICD10-I70.0) and Emphysema (ICD10-J43.9). Electronically Signed   By: Inez Catalina M.D.   On: 07/16/2022 23:25   CT CHEST WO CONTRAST  Result Date: 07/15/2022 CLINICAL DATA:  Hypoxia. EXAM: CT CHEST WITHOUT CONTRAST TECHNIQUE: Multidetector CT imaging of the chest was performed following the  standard protocol without IV contrast. RADIATION DOSE REDUCTION: This exam was performed according to the departmental dose-optimization program which includes automated exposure control, adjustment of the mA and/or kV according to patient size and/or use of iterative reconstruction technique. COMPARISON:  Chest radiograph dated 07/15/2022. Chest CT dated 04/08/2004. FINDINGS: Evaluation of this exam is limited in the absence of intravenous contrast. Cardiovascular: There is no cardiomegaly or pericardial effusion. Mild atherosclerotic calcification of the thoracic aorta. No aneurysmal dilatation. The central pulmonary arteries are grossly unremarkable. Mediastinum/Nodes: No hilar or mediastinal adenopathy. The esophagus is grossly unremarkable. No mediastinal fluid collection. Lungs/Pleura: Background of emphysema. There is volume loss in the right middle lobe with diffuse ground-glass density which may represent atelectasis. Infiltrate is less likely. There is narrowing of the right middle lobe bronchus. Linear high attenuating area along the bronchus intermedius adjacent to the takeoff of the right middle lobe bronchus may represent calcification or postsurgical changes. Correlation with surgical history recommended. No obvious mass identified in the right hilar region on this noncontrast CT. CT with IV contrast may provide better evaluation if clinically indicated. There are bibasilar linear atelectasis/scarring. No consolidative changes. There is no pleural effusion or pneumothorax. The central airways are patent. Upper Abdomen: Gallstone. Musculoskeletal: Osteopenia with degenerative changes of the spine. No acute osseous pathology. IMPRESSION: 1. Narrowed appearance of the right middle lobe bronchus with probable atelectatic changes of the right middle lobe. Correlation with history of prior surgery recommended. CT with IV contrast may provide better evaluation of the right hilum if clinically indicated. 2.  Cholelithiasis. 3. Aortic Atherosclerosis (ICD10-I70.0) and Emphysema (ICD10-J43.9). Electronically Signed   By: Anner Crete M.D.   On: 07/15/2022 23:06   DG Chest Portable 1 View  Result Date: 07/15/2022 CLINICAL DATA:  Shortness of breath EXAM: PORTABLE CHEST 1 VIEW COMPARISON:  Chest x-ray 09/13/2021 FINDINGS: Hyperinflation. No consolidation, pneumothorax. There is  blunting of the right costophrenic angle. No edema. Basilar atelectasis. Normal cardiopericardial silhouette with calcified aorta. No edema. Overlapping cardiac leads. IMPRESSION: Hyperinflation. Presumed basilar atelectasis or scar. Blunting of the right costophrenic angle. Tiny effusion versus thickening Electronically Signed   By: Jill Side M.D.   On: 07/15/2022 16:19    Microbiology: Results for orders placed or performed during the hospital encounter of 07/15/22  Resp panel by RT-PCR (RSV, Flu A&B, Covid) Anterior Nasal Swab     Status: None   Collection Time: 07/15/22  4:27 PM   Specimen: Anterior Nasal Swab  Result Value Ref Range Status   SARS Coronavirus 2 by RT PCR NEGATIVE NEGATIVE Final    Comment: (NOTE) SARS-CoV-2 target nucleic acids are NOT DETECTED.  The SARS-CoV-2 RNA is generally detectable in upper respiratory specimens during the acute phase of infection. The lowest concentration of SARS-CoV-2 viral copies this assay can detect is 138 copies/mL. A negative result does not preclude SARS-Cov-2 infection and should not be used as the sole basis for treatment or other patient management decisions. A negative result may occur with  improper specimen collection/handling, submission of specimen other than nasopharyngeal swab, presence of viral mutation(s) within the areas targeted by this assay, and inadequate number of viral copies(<138 copies/mL). A negative result must be combined with clinical observations, patient history, and epidemiological information. The expected result is Negative.  Fact  Sheet for Patients:  EntrepreneurPulse.com.au  Fact Sheet for Healthcare Providers:  IncredibleEmployment.be  This test is no t yet approved or cleared by the [place] FDA and  has been authorized for detection and/or diagnosis of SARS-CoV-2 by FDA under an Emergency Use Authorization (EUA). This EUA will remain  in effect (meaning this test can be used) for the duration of the COVID-19 declaration under Section 564(b)(1) of the Act, 21 U.S.C.section 360bbb-3(b)(1), unless the authorization is terminated  or revoked sooner.       Influenza A by PCR NEGATIVE NEGATIVE Final   Influenza B by PCR NEGATIVE NEGATIVE Final    Comment: (NOTE) The Xpert Xpress SARS-CoV-2/FLU/RSV plus assay is intended as an aid in the diagnosis of influenza from Nasopharyngeal swab specimens and should not be used as a sole basis for treatment. Nasal washings and aspirates are unacceptable for Xpert Xpress SARS-CoV-2/FLU/RSV testing.  Fact Sheet for Patients: EntrepreneurPulse.com.au  Fact Sheet for Healthcare Providers: IncredibleEmployment.be  This test is not yet approved or cleared by the [place] FDA and has been authorized for detection and/or diagnosis of SARS-CoV-2 by FDA under an Emergency Use Authorization (EUA). This EUA will remain in effect (meaning this test can be used) for the duration of the COVID-19 declaration under Section 564(b)(1) of the Act, 21 U.S.C. section 360bbb-3(b)(1), unless the authorization is terminated or revoked.     Resp Syncytial Virus by PCR NEGATIVE NEGATIVE Final    Comment: (NOTE) Fact Sheet for Patients: EntrepreneurPulse.com.au  Fact Sheet for Healthcare Providers: IncredibleEmployment.be  This test is not yet approved or cleared by the [place] FDA and has been authorized for detection and/or diagnosis of SARS-CoV-2 by FDA under an  Emergency Use Authorization (EUA). This EUA will remain in effect (meaning this test can be used) for the duration of the COVID-19 declaration under Section 564(b)(1) of the Act, 21 U.S.C. section 360bbb-3(b)(1), unless the authorization is terminated or revoked.  Performed at [facility], [place] [address], [place], [place] [address]   Expectorated Sputum Assessment w Gram Stain, Rflx to Resp Cult  Status: None   Collection Time: 07/17/22  7:07 PM   Specimen: Expectorated Sputum  Result Value Ref Range Status   Specimen Description EXPECTORATED SPUTUM  Final   Special Requests NONE  Final   Sputum evaluation   Final    THIS SPECIMEN IS ACCEPTABLE FOR SPUTUM CULTURE Performed at [facility], [place] [address] [place], [place] [address]    Report Status 07/17/2022 FINAL  Final  Culture, Respiratory w Gram Stain     Status: None (Preliminary result)   Collection Time: 07/17/22  7:07 PM  Result Value Ref Range Status   Specimen Description   Final    EXPECTORATED SPUTUM Performed at [place] [address] [place], [place] [address]    Special Requests   Final    NONE Reflexed from [id number] Performed at [place] [address] [place], [place] [address]    Gram Stain   Final    RARE SQUAMOUS EPITHELIAL CELLS PRESENT RARE WBC PRESENT, PREDOMINANTLY MONONUCLEAR FEW YEAST WITH PSEUDOHYPHAE FEW GRAM POSITIVE RODS FEW GRAM POSITIVE COCCI IN PAIRS IN SINGLES    Culture   Final    FEW PSEUDOMONAS AERUGINOSA FEW KLEBSIELLA PNEUMONIAE SUSCEPTIBILITIES TO FOLLOW Performed at [facility], [place] [address] [place] [address], [place],  [address]    Report Status PENDING  Incomplete   Organism ID, Bacteria PSEUDOMONAS AERUGINOSA  Final      Susceptibility   Pseudomonas aeruginosa - MIC*    CEFTAZIDIME 4 SENSITIVE Sensitive     CIPROFLOXACIN <=0.25 SENSITIVE Sensitive     GENTAMICIN 2 SENSITIVE Sensitive      IMIPENEM 1 SENSITIVE Sensitive     PIP/TAZO <=4 SENSITIVE Sensitive     CEFEPIME 2 SENSITIVE Sensitive     * FEW PSEUDOMONAS AERUGINOSA    Labs: CBC: Recent Labs  Lab 07/15/22 1627 07/17/22 0402 07/18/22 0701 07/19/22 0419  WBC 5.8 12.1* 10.9* 9.9  NEUTROABS 3.4 10.0*  --   --   HGB 17.0* 15.8* 16.2* 15.6*  HCT 53.2* 49.0* 50.6* 48.5*  MCV 99.1 97.0 97.9 97.0  PLT 189 181 150 [id number]   Basic Metabolic Panel: Recent Labs  Lab 07/15/22 1627 07/17/22 0402 07/18/22 0701 07/19/22 0419  NA 138 138 138 137  K 4.3 4.1 3.8 3.8  CL 103 100 100 101  CO2 '27 29 31 29  '$ GLUCOSE 90 106* 93 108*  BUN 19 25* 19 18  CREATININE 0.75 0.78 0.67 0.61  CALCIUM 9.0 9.0 8.8* 8.6*  MG  --  1.9 1.9  --   PHOS  --  3.2 3.0  --    Liver Function Tests: Recent Labs  Lab 07/17/22 0402  AST 24  ALT 14  ALKPHOS 45  BILITOT 0.4  PROT 5.7*  ALBUMIN 3.2*   CBG: No results for input(s): "GLUCAP" in the last 168 hours.  Discharge time spent: greater than 30 minutes.  Signed: Estill Cotta, MD [facility] 07/20/2022

## 2022-07-20 NOTE — Plan of Care (Addendum)
Notified by RN, O2 sats 87 to 88%, on exertion was bumped to 4 L O2 via [place].     Due to worsening hypoxia -will obtain BNP, chest x-ray, has received Lasix 40 mg p.o. x 1, 2D echo for further workup. -Will hold discharge   Adeliz Tonkinson M.D.  [facility] 07/20/2022, 1:12 PM

## 2022-07-20 NOTE — Progress Notes (Addendum)
SATURATION QUALIFICATIONS: (This note is used to comply with regulatory documentation for home oxygen)  Patient Saturations on Room Air at Rest = 91%  Patient Saturations on Room Air while Ambulating = 83%  Patient Saturations on 3 Liters of oxygen while Ambulating = 94%  Please briefly explain why patient needs home oxygen: pt unable to maintain O2 saturation greater than 92% on room air.

## 2022-07-20 NOTE — Plan of Care (Signed)

## 2022-07-21 ENCOUNTER — Inpatient Hospital Stay (HOSPITAL_COMMUNITY): Payer: Medicare Other

## 2022-07-21 DIAGNOSIS — Z720 Tobacco use: Secondary | ICD-10-CM | POA: Diagnosis not present

## 2022-07-21 DIAGNOSIS — I5031 Acute diastolic (congestive) heart failure: Secondary | ICD-10-CM

## 2022-07-21 DIAGNOSIS — R0902 Hypoxemia: Secondary | ICD-10-CM | POA: Diagnosis not present

## 2022-07-21 DIAGNOSIS — J4489 Other specified chronic obstructive pulmonary disease: Secondary | ICD-10-CM | POA: Diagnosis not present

## 2022-07-21 DIAGNOSIS — J441 Chronic obstructive pulmonary disease with (acute) exacerbation: Secondary | ICD-10-CM | POA: Diagnosis not present

## 2022-07-21 LAB — CBC
HCT: 53.5 % — ABNORMAL HIGH (ref 36.0–46.0)
Hemoglobin: 16.9 g/dL — ABNORMAL HIGH (ref 12.0–15.0)
MCH: 30.8 pg (ref 26.0–34.0)
MCHC: 31.6 g/dL (ref 30.0–36.0)
MCV: 97.4 fL (ref 80.0–100.0)
Platelets: 206 10*3/uL (ref 150–400)
RBC: 5.49 MIL/uL — ABNORMAL HIGH (ref 3.87–5.11)
RDW: 14.6 % (ref 11.5–15.5)
WBC: 8.9 10*3/uL (ref 4.0–10.5)
nRBC: 0 % (ref 0.0–0.2)

## 2022-07-21 LAB — BASIC METABOLIC PANEL
Anion gap: 8 (ref 5–15)
BUN: 26 mg/dL — ABNORMAL HIGH (ref 8–23)
CO2: 34 mmol/L — ABNORMAL HIGH (ref 22–32)
Calcium: 9.6 mg/dL (ref 8.9–10.3)
Chloride: 94 mmol/L — ABNORMAL LOW (ref 98–111)
Creatinine, Ser: 0.77 mg/dL (ref 0.44–1.00)
GFR, Estimated: >60 mL/min (ref >60)
Glucose, Bld: 42 mg/dL — CL (ref 70–99)
Potassium: 3.7 mmol/L (ref 3.5–5.1)
Sodium: 136 mmol/L (ref 135–145)

## 2022-07-21 LAB — CULTURE, RESPIRATORY W GRAM STAIN

## 2022-07-21 LAB — ECHOCARDIOGRAM COMPLETE
Area-P 1/2: 3.72 cm2
Calc EF: 57.5 %
Height: 66 in
S' Lateral: 3.1 cm
Single Plane A2C EF: 62.8 %
Single Plane A4C EF: 57.9 %
Weight: 1756.63 oz

## 2022-07-21 LAB — GLUCOSE, CAPILLARY: Glucose-Capillary: 84 mg/dL (ref 70–99)

## 2022-07-21 MED ORDER — LEVOFLOXACIN 750 MG PO TABS: 750.0000 mg | ORAL_TABLET | Freq: Every day | ORAL | 0 refills | Status: AC

## 2022-07-21 MED ORDER — FUROSEMIDE 20 MG PO TABS: 20.0000 mg | ORAL_TABLET | Freq: Every day | ORAL | 2 refills | Status: DC | PRN

## 2022-07-21 NOTE — Plan of Care (Signed)

## 2022-07-21 NOTE — Care Management Important Message (Signed)
Important Message  Patient Details IM Letter given. Name: BOSTYN BRICH MRN: [id number] Date of Birth: [birth year]   Medicare Important Message Given:  Yes     Shadiya, [place] 07/21/2022, 9:52 AM

## 2022-07-21 NOTE — TOC Progression Note (Signed)
Transition of Care [facility]) - Progression Note    Patient Details  Name: Melody White MRN: [id number] Date of Birth: 01-16-1951  [facility] CM/SW [place], RN Phone Number:[phone]  07/21/2022, 11:31 AM  Clinical Narrative:    [facility] consulted for DME home O2. Home O2 referral has been called to [facility] with [employer]. O2 to be delivered to the bedside.      Barriers to Discharge: Continued Medical Work up  Expected Discharge Plan and Services         Expected Discharge Date: 07/21/22               DME Arranged: Oxygen DME Agency: [employer] Date DME Agency Contacted: 07/20/22 Time DME Agency Contacted: [phone] Representative spoke with at DME Agency: Rica Koyanagi HH Arranged: NA [place] Agency: NA         Social Determinants of Health ([place]) Interventions SDOH Screenings   Food Insecurity: No Food Insecurity (07/20/2022)  Housing: Low Risk  (07/20/2022)  Transportation Needs: No Transportation Needs (07/20/2022)  Utilities: Not At Risk (07/20/2022)  Tobacco Use: High Risk (07/15/2022)    Readmission Risk Interventions     No data to display

## 2022-07-21 NOTE — Progress Notes (Signed)
Echocardiogram 2D Echocardiogram has been performed.  Melody White 07/21/2022, 8:44 AM

## 2022-07-21 NOTE — Progress Notes (Signed)
SATURATION QUALIFICATIONS: (This note is used to comply with regulatory documentation for home oxygen)   Patient Saturations on Room Air at Rest = 89%   Patient Saturations on Room Air while Ambulating = 83%   Patient Saturations on 3 Liters of oxygen while Ambulating = 92%  Patient Saturations on 2 Liters of oxygen while Ambulating = 90%     Please briefly explain why patient needs

## 2022-07-21 NOTE — Progress Notes (Signed)
Mobility Specialist - Progress Note   07/21/22 0952  Oxygen Therapy  SpO2 90 %  O2 Device Nasal Cannula  O2 Flow Rate (L/min) 2 L/min  Mobility  Activity Ambulated with assistance in hallway  Level of Assistance Independent after set-up  Assistive Device None  Distance Ambulated (ft) 700 ft  Activity Response Tolerated well  Mobility Referral Yes  $Mobility charge 1 Mobility   Pt received in chair and agreed to mobility, had no issues during mobility. Pt SpO2 was 92% on 3L. Nurse requested to lower O2 to 2L/min and SpO2 dropped to 90%.   Pt returned to chair with all needs met and returned to 3L.  Roderick Pee Mobility Specialist

## 2022-07-21 NOTE — Discharge Summary (Signed)
Physician Discharge Summary   Patient: Melody White MRN: [id number] DOB: [birth year]  Admit date:     07/15/2022  Discharge date: 07/21/22  Discharge Physician: Estill Cotta   PCP: Aletha Halim., PA-C   Recommendations at discharge:    Continue Flagyl 500 mg twice daily, Levaquin 750 mg daily for 3 more days Home O2 evaluation completed, requires 2-3 L O2 with ambulation  SATURATION QUALIFICATIONS: (This note is used to comply with regulatory documentation for home oxygen)   Patient Saturations on Room Air at Rest = 89%   Patient Saturations on Room Air while Ambulating = 83%   Patient Saturations on 3 Liters of oxygen while Ambulating = 92%   Patient Saturations on 2 Liters of oxygen while Ambulating = 90%     Please briefly explain why patient needs: Hypoxia with acute respiratory failure  Discharge Diagnoses:     Acute respiratory failure with hypoxia ([place])   Tobacco use   Hypothyroidism   COPD with acute exacerbation ([place]) Persistent collapse in the right middle lobe Mild acute diastolic CHF  Hospital Course:  Patient is a 72 year old female with hypothyroidism, TIA, COPD, current smoker 1 pack of cigarettes lasts 2 days, who presented to the hospital due to generalized weakness, with poor appetite and oral intake, found to have hypoxia.  Endorses having trouble swallowing solid foods.  Per Husband, she has been laying around for the last 4days, not getting up.  Workup in the ED revealed COPD exacerbation with a congested cough and hypoxia.  Not on oxygen supplementation at baseline.  CTA chest showed no PE however revealed persistent collapse in the right middle lobe with attenuation of the right middle lobe bronchus.   Assessment and Plan:   Acute respiratory failure with hypoxia secondary to COPD exacerbation. Mild acute diastolic CHF versus volume overload -CTA chest negative for pulmonary embolism, has right middle lobe collapse, noted similar to prior  exam, emphysema. -Placed on IV Rocephin and Flagyl -Home O2 evaluation completed, needs 2-3 L on ambulation. -Outpatient ambulatory referral to pulmonology sent for follow-up. Wean O2 as tolerated, explained to the patient, has a pulse ox at home.  -  I's and O's with 3 L positive, patient received 1 dose of oral Lasix '40mg'$  Chest x-ray on 3/7 showed trace bilateral pleural effusions versus pleural thickening, linear areas of bibasilar consolidation favoring hypoventilatory changes or scarring 2D echo showed EF of 55 to 60%, G1 DD -Patient recommended to continue Lasix 20 mg daily for 3 days then as needed -Transition to oral Levaquin and Flagyl for 3 more days     Persistent collapse in the right middle lobe with attenuation of right middle lobe bronchus -Unclear etiology, recommended to complete full course of antibiotics, outpatient referral to pulmonology sent.   History of TIA - no acute issues, continue aspirin, Lipitor   Hypothyroidism -Continue Synthroid, TSH 0.17 on 3/4   Dysphagia -Underwent MBS and was recommended dysphagia 3 diet with thin liquids   Cholelithiasis -Incidental finding on CT chest, currently no acute issues, no abdominal pain, nausea vomiting, LFTs normal.  Tolerating diet.   Tobacco use -Currently smoking 0.5 pack/day -Smoking cessation recommended   Moderate protein calorie malnutrition Nutrition Problem: Increased nutrient needs Etiology: chronic illness Interventions: Ensure Enlive (each supplement provides 350kcal and 20 grams of protein)   Estimated body mass index is 17.72 kg/m as calculated from the following:   Height as of this encounter: '5\' 6"'$  (1.676 m).   Weight as of  this encounter: 49.8 kg.        Pain control - [employer] Controlled Substance Reporting System database was reviewed. and patient was instructed, not to drive, operate heavy machinery, perform activities at heights, swimming or participation in water activities or  provide baby-sitting services while on Pain, Sleep and Anxiety Medications; until their outpatient Physician has advised to do so again. Also recommended to not to take more than prescribed Pain, Sleep and Anxiety Medications.  Consultants: none Procedures performed: 2D echo, MBS Disposition: Home Diet recommendation:  Discharge Diet Orders (From admission, onward)     Start     Ordered   07/21/22 0000  Diet - low sodium heart healthy        07/21/22 1009           Soft diet, heart healthy   DISCHARGE MEDICATION: Allergies as of 07/21/2022       Reactions   Amoxicillin Other (See Comments)   other   Azithromycin Itching   Clindamycin Other (See Comments)   Funny feeling other   Codeine Itching, Other (See Comments)   other   Fexofenadine Other (See Comments)   Funny feeling other   Hydrocodone Bit-homatrop Mbr Itching   Sulfamethoxazole Nausea Only, Other (See Comments)   other        Medication List     TAKE these medications    acetaminophen 325 MG tablet Commonly known as: TYLENOL Take 1-2 tablets (325-650 mg total) by mouth every 6 (six) hours as needed for mild pain or moderate pain (pain score 1-3 or temp > 100.5).   albuterol 108 (90 Base) MCG/ACT inhaler Commonly known as: VENTOLIN HFA Inhale 2 puffs into the lungs every 6 (six) hours as needed for wheezing or shortness of breath.   alendronate 70 MG tablet Commonly known as: FOSAMAX Take 70 mg by mouth once a week.   aspirin EC 81 MG tablet Take 1 tablet (81 mg total) by mouth daily. Swallow whole.   atorvastatin 20 MG tablet Commonly known as: LIPITOR Take 1 tablet (20 mg total) by mouth daily.   budesonide-formoterol 160-4.5 MCG/ACT inhaler Commonly known as: SYMBICORT Inhale 2 puffs into the lungs 2 (two) times daily.   calcium carbonate 1500 (600 Ca) MG Tabs tablet Commonly known as: Calcium 600 Take 1 tablet (1,500 mg total) by mouth 2 (two) times daily with a meal.    Cholecalciferol 25 MCG (1000 UT) tablet Take 1 tablet (1,000 Units total) by mouth daily.   Compressor/Nebulizer Misc 1 Units by Does not apply route as directed.   diazepam 10 MG tablet Commonly known as: VALIUM Take 10 mg by mouth at bedtime as needed for anxiety or sleep. Into the vagina every night as needed for pelvic floor spasms   escitalopram 20 MG tablet Commonly known as: LEXAPRO Take 1 tablet (20 mg total) by mouth daily.   fluconazole 100 MG tablet Commonly known as: Diflucan Take 1 tablet (100 mg total) by mouth daily for 10 days.   furosemide 20 MG tablet Commonly known as: Lasix Take 1 tablet (20 mg total) by mouth daily as needed for fluid or edema (Shortness of breath). Please take Lasix 20 mg daily for 3 days, then continue as needed for shortness of breath, swelling.   guaiFENesin 600 MG 12 hr tablet Commonly known as: MUCINEX Take 1 tablet (600 mg total) by mouth 2 (two) times daily for 7 days.   ipratropium-albuterol 0.5-2.5 (3) MG/3ML Soln Commonly known as: DUONEB Take  3 mLs by nebulization 3 (three) times daily.   levofloxacin 750 MG tablet Commonly known as: LEVAQUIN Take 1 tablet (750 mg total) by mouth daily for 3 days.   levothyroxine 75 MCG tablet Commonly known as: SYNTHROID Take 1 tablet (75 mcg total) by mouth daily.   metroNIDAZOLE 500 MG tablet Commonly known as: Flagyl Take 1 tablet (500 mg total) by mouth 2 (two) times daily for 3 days.   montelukast 10 MG tablet Commonly known as: SINGULAIR Take 1 tablet (10 mg total) by mouth at bedtime.   multivitamin capsule Take 1 capsule by mouth daily.   nystatin 100000 UNIT/ML suspension Commonly known as: MYCOSTATIN Take 5 mLs (500,000 Units total) by mouth 4 (four) times daily for 7 days.   ondansetron 4 MG tablet Commonly known as: Zofran Take 1 tablet (4 mg total) by mouth every 8 (eight) hours as needed for nausea or vomiting.   VITAMIN B COMPLEX PO Take 1 tablet by mouth  daily.               Durable Medical Equipment  (From admission, onward)           Start     Ordered   07/21/22 1136  For home use only DME oxygen  Once       Question Answer Comment  Length of Need 12 Months   Mode or (Route) Nasal cannula   Liters per Minute 2   Frequency Continuous (stationary and portable oxygen unit needed)   Oxygen conserving device Yes   Oxygen delivery system Gas      07/21/22 1135            Follow-up Information     Aletha Halim., PA-C Follow up in 1 week(s).   Specialty: Family Medicine Why: hospital discharge follow up, pcp to refer you to a pulmonologist to have lung function test and COPD management Contact information: [address] [address] [phone]         [facility] Follow up.   Contact information: [place] [address] [phone]                Discharge Exam: Danley Danker Weights   07/17/22 0528 07/18/22 0424  Weight: 53.4 kg 49.8 kg   S: Sitting up in the chair, states Lasix helped and she is breathing better today, she feels better on 2 L O2 as 3 to 4 L O2 gives her headache.  Wants to go home.  No fevers or chills, no coughing.  BP 129/83 (BP Location: Left Arm)   Pulse 72   Temp 97.8 F (36.6 C) (Oral)   Resp 17   Ht '5\' 6"'$  (1.676 m)   Wt 49.8 kg   SpO2 97%   BMI 17.72 kg/m   Physical Exam General: Alert and oriented x 3, NAD Cardiovascular: S1 S2 clear, RRR.  Respiratory: CTAB, no wheezing Gastrointestinal: Soft, nontender, nondistended, NBS Ext: no pedal edema bilaterally Neuro: no new deficits Psych: Normal affect    Condition at discharge: fair  The results of significant diagnostics from this hospitalization (including imaging, microbiology, ancillary and laboratory) are listed below for reference.   Imaging Studies: ECHOCARDIOGRAM COMPLETE  Result Date: 07/21/2022    ECHOCARDIOGRAM REPORT   Patient Name:   ACSA ARANGO Date of  Exam: 07/21/2022 Medical Rec #:  [id number]     Height:       66.0 in Accession #:  [id number]    Weight:       109.8 lb Date of Birth:  [birth year]     BSA:          1.550 m Patient Age:    72 years      BP:           98/72 mmHg Patient Gender: F             HR:           65 bpm. Exam Location:  Inpatient Procedure: 2D Echo, Cardiac Doppler and Color Doppler Indications:    [id number] Acute diastolic (congestive) heart failure  History:        Patient has no prior history of Echocardiogram examinations.                 Stroke; Risk Factors:Hypertension.  Sonographer:    Phineas Douglas Referring Phys: [id number] Makeyla Govan K Terita Hejl IMPRESSIONS  1. Left ventricular ejection fraction, by estimation, is 55 to 60%. The left ventricle has normal function. The left ventricle has no regional wall motion abnormalities. Left ventricular diastolic parameters are consistent with Grade I diastolic dysfunction (impaired relaxation).  2. Right ventricular systolic function is normal. The right ventricular size is normal. Tricuspid regurgitation signal is inadequate for assessing PA pressure.  3. The mitral valve is normal in structure. Trivial mitral valve regurgitation. No evidence of mitral stenosis.  4. The aortic valve is tricuspid. Aortic valve regurgitation is not visualized. No aortic stenosis is present.  5. The inferior vena cava is normal in size with <50% respiratory variability, suggesting right atrial pressure of 8 mmHg. FINDINGS  Left Ventricle: Left ventricular ejection fraction, by estimation, is 55 to 60%. The left ventricle has normal function. The left ventricle has no regional wall motion abnormalities. The left ventricular internal cavity size was normal in size. There is  no left ventricular hypertrophy. Left ventricular diastolic parameters are consistent with Grade I diastolic dysfunction (impaired relaxation). Right Ventricle: The right ventricular size is normal. No increase in right ventricular wall thickness. Right  ventricular systolic function is normal. Tricuspid regurgitation signal is inadequate for assessing PA pressure. Left Atrium: Left atrial size was normal in size. Right Atrium: Right atrial size was normal in size. Pericardium: There is no evidence of pericardial effusion. Mitral Valve: The mitral valve is normal in structure. Trivial mitral valve regurgitation. No evidence of mitral valve stenosis. Tricuspid Valve: The tricuspid valve is normal in structure. Tricuspid valve regurgitation is not demonstrated. Aortic Valve: The aortic valve is tricuspid. Aortic valve regurgitation is not visualized. No aortic stenosis is present. Pulmonic Valve: The pulmonic valve was normal in structure. Pulmonic valve regurgitation is not visualized. Aorta: The aortic root is normal in size and structure. Venous: The inferior vena cava is normal in size with less than 50% respiratory variability, suggesting right atrial pressure of 8 mmHg. IAS/Shunts: No atrial level shunt detected by color flow Doppler.  LEFT VENTRICLE PLAX 2D LVIDd:         4.30 cm     Diastology LVIDs:         3.10 cm     LV e' medial:    6.53 cm/s LV PW:         0.90 cm     LV E/e' medial:  13.5 LV IVS:        0.90 cm     LV e' lateral:   7.51 cm/s LVOT diam:     2.00 cm  LV E/e' lateral: 11.8 LV SV:         55 LV SV Index:   35 LVOT Area:     3.14 cm  LV Volumes (MOD) LV vol d, MOD A2C: 62.3 ml LV vol d, MOD A4C: 66.8 ml LV vol s, MOD A2C: 23.2 ml LV vol s, MOD A4C: 28.1 ml LV SV MOD A2C:     39.1 ml LV SV MOD A4C:     66.8 ml LV SV MOD BP:      37.1 ml RIGHT VENTRICLE            IVC RV Basal diam:  3.00 cm    IVC diam: 1.90 cm RV S prime:     9.57 cm/s TAPSE (M-mode): 1.8 cm LEFT ATRIUM             Index        RIGHT ATRIUM          Index LA diam:        2.90 cm 1.87 cm/m   RA Area:     9.93 cm LA Vol (A2C):   40.4 ml 26.07 ml/m  RA Volume:   19.10 ml 12.32 ml/m LA Vol (A4C):   27.5 ml 17.74 ml/m LA Biplane Vol: 34.1 ml 22.00 ml/m  AORTIC VALVE LVOT  Vmax:   72.70 cm/s LVOT Vmean:  48.700 cm/s LVOT VTI:    0.174 m  AORTA Ao Root diam: 3.20 cm Ao Asc diam:  2.70 cm MITRAL VALVE MV Area (PHT): 3.72 cm    SHUNTS MV Decel Time: 204 msec    Systemic VTI:  0.17 m MV E velocity: 88.30 cm/s  Systemic Diam: 2.00 cm MV A velocity: 84.40 cm/s MV E/A ratio:  1.05 Dalton McleanMD Electronically signed by Franki Monte Signature Date/Time: 07/21/2022/8:50:34 AM    Final    DG Chest 2 View  Result Date: 07/20/2022 CLINICAL DATA:  Hypoxia EXAM: CHEST - 2 VIEW COMPARISON:  07/15/2022 FINDINGS: Frontal and lateral views of the chest demonstrate a stable cardiac silhouette. There is background emphysema, with scattered linear opacities at the lung bases favoring hypoventilatory changes or scarring. Blunting of the bilateral costophrenic angles could reflect pleural thickening or small effusions. No pneumothorax. No acute bony abnormality. IMPRESSION: 1. Linear areas of bibasilar consolidation favoring hypoventilatory changes or scarring. 2. Trace bilateral pleural effusions versus pleural thickening. 3. Emphysema. Electronically Signed   By: Randa Ngo M.D.   On: 07/20/2022 14:15   DG Swallowing Func-Speech Pathology  Result Date: 07/19/2022 Table formatting from the original result was not included. 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  Swallow eval ordered by Dr Nevada Crane.  Pt is s/p esophagram that showed mild dysmotlity - however SLP has concerns for pharyngeal motility issues given XRT. Pt reports her dysphagia may contribute to her weight loss - as she reports she stops eating solids due to having to regurgitate. She advised  that she feels food "sticking" and then it will "come back up". She states the food and drink come back up together. Clinical Impression Patient presents with moderate oropharyngeal-cervical esophageal dysphagia due to iatrogenic effects of  XRT from 2004.  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  Fortunately, patient senses retention and uses liquids and dry swallows to clear.  No aspiration or laryngeal penetration observed during entire study even with sequential swallows. Laryngeal elevation/closure is excellent.    Impaired tongue base retraction, pharyngeal motility allows vallecular more than pyriform sinus retention- much more impaired with solids than liquids.  At one time, she sensed retention in distal pharynx - when her pharynx appeared clear.  PES opening in this patient appears impaired including timing and adequacy of opening causing near retrograde propulsion from proximal esophagus into pharynx. Liquid was again effective to decrease retention. Chin tuck posture decreased amount of retention with puree.   Recommend continue diet with strict use of compensation strategies, SLP educated pt to findings/recommendations using teach back.  As patient reports her dysphagia has contributed to weight loss, she may benefit from ENT referral as an OP to determine if PES intervention may be warranted to help maximize her nutrition.  Fortunately, she is protective of her airway with her strategies. Factors that may increase risk of adverse event in presence of aspiration ([place] 2021): Frail or deconditioned;Inadequate oral hygiene;Reduced saliva;Poor general health and/or compromised immunity;Respiratory or GI disease Swallow Evaluation Recommendations Recommendations: PO diet PO Diet Recommendation: Dysphagia 3 (Mechanical soft);Regular;Thin liquids (Level 0) Liquid Administration via: Cup;Straw Medication Administration: Whole meds with liquid Supervision: Patient able to  self-feed Swallowing strategies  : Minimize environmental distractions;Slow rate;Small bites/sips;Follow solids with liquids;Start intake with liquids; Multiple swallows Postural changes: Position pt fully upright for meals;Stay upright 30-60 min after meals Oral care recommendations: Oral care BID (2x/day) Factors that may increase risk of adverse event in presence of aspiration ([place] 2021): Factors that may increase risk of adverse event in presence of aspiration ([place] 2021): Frail or deconditioned; Inadequate oral hygiene; Reduced saliva; Poor general health and/or compromised immunity; Respiratory or GI disease Treatment Plan Treatment Plan Treatment recommendations: Therapy as outlined in treatment plan below Follow-up recommendations: No SLP follow up Functional status assessment: Patient has had a recent decline in their functional status and/or demonstrates limited ability to make significant improvements in function in a reasonable and predictable amount of time. Treatment frequency: Min 1x/week Treatment duration: 1 week Interventions: Aspiration precaution training; Patient/family education; Trials of upgraded texture/liquids; Diet toleration management by SLP Recommendations Recommendations for follow up therapy are one component of a multi-disciplinary discharge planning process, led by the attending physician.  Recommendations may be updated based on patient status, additional functional criteria and insurance authorization. Assessment: Orofacial Exam: Orofacial Exam Oral Cavity: Oral Hygiene: Xerostomia; Erythema; Lingual coating Oral Cavity - Dentition: Dentures, top; Dentures, bottom Orofacial Anatomy: WFL Oral Motor/Sensory Function: Generalized oral weakness (impaired mandibular ROM and lingual protrusion iatrogenic effects of XRT) Anatomy: Anatomy: WFL Thin Liquids: Thin Liquids (Level 0) Thin Liquids : Impaired Thin Liquid - Impairment: Pharyngeal impairment Lip Closure:  No labial escape Tongue control during bolus hold: Cohesive bolus between tongue to palatal seal Bolus transport/lingual motion: Brisk tongue motion Oral residue: Trace residue lining oral structures Location of oral residue : Tongue Initiation of swallow : Pyriform sinuses Soft palate elevation: No bolus between soft palate (SP)/pharyngeal wall (PW) Laryngeal elevation: Complete superior movement of thyroid cartilage with complete approximation of arytenoids to epiglottic petiole Anterior hyoid excursion: Complete Epiglottic movement: Complete Laryngeal vestibule closure: Complete, no air/contrast in laryngeal vestibule Pharyngeal stripping wave : Present - complete Pharyngoesophageal segment opening: Complete distension and  complete duration, no obstruction of flow Tongue base retraction: Trace column of contrast or air between tongue base and PPW Pharyngeal residue: Trace residue within or on pharyngeal structures Location of pharyngeal residue: Diffuse (>3 areas) Penetration/Aspiration Scale (PAS) score: 1.  Material does not enter airway  Mildly Thick Liquids: Mildly thick liquids (Level 2, nectar thick) Mildly thick liquids (Level 2, nectar thick): Impaired Mildly Thick Liquid - Impairment: Pharyngeal impairment Lip Closure: No labial escape Bolus transport/lingual motion: Brisk tongue motion Oral residue: Trace residue lining oral structures Location of oral residue : Tongue Initiation of swallow : Valleculae Soft palate elevation: No bolus between soft palate (SP)/pharyngeal wall (PW) Laryngeal elevation: Complete superior movement of thyroid cartilage with complete approximation of arytenoids to epiglottic petiole Anterior hyoid excursion: Complete Epiglottic movement: Complete Laryngeal vestibule closure: Complete, no air/contrast in laryngeal vestibule Pharyngeal stripping wave : Present - diminished Pharyngeal contraction (A/P view only): Unilateral bulging Pharyngoesophageal segment opening: Complete  distension and complete duration, no obstruction of flow Tongue base retraction: Narrow column of contrast or air between tongue base and PPW Pharyngeal residue: Trace residue within or on pharyngeal structures Location of pharyngeal residue: Diffuse (>3 areas) Penetration/Aspiration Scale (PAS) score: 1.  Material does not enter airway  Moderately Thick Liquids: Moderately thick liquids (Level 3, honey thick) Moderately thick liquids (Level 3, honey thick): Impaired Bolus delivery method: Spoon Moderately Thick Liquid - Impairment: Pharyngeal impairment Lip Closure: No labial escape Tongue control during bolus hold: Cohesive bolus between tongue to palatal seal Bolus transport/lingual motion: Brisk tongue motion Oral residue: Trace residue lining oral structures Location of oral residue : Tongue Initiation of swallow : Valleculae Soft palate elevation: No bolus between soft palate (SP)/pharyngeal wall (PW) Laryngeal elevation: Complete superior movement of thyroid cartilage with complete approximation of arytenoids to epiglottic petiole Anterior hyoid excursion: Complete Epiglottic movement: Complete Laryngeal vestibule closure: Complete, no air/contrast in laryngeal vestibule Pharyngeal stripping wave : Present - diminished Pharyngoesophageal segment opening: Partial distention/partial duration, partial obstruction of flow Tongue base retraction: Narrow column of contrast or air between tongue base and PPW Pharyngeal residue: Trace residue within or on pharyngeal structures Location of pharyngeal residue: Diffuse (>3 areas) Penetration/Aspiration Scale (PAS) score: 1.  Material does not enter airway  Puree: Puree Puree: Impaired Puree - Impairment: Oral Impairment; Pharyngeal impairment Lip Closure: No labial escape Bolus transport/lingual motion: Brisk tongue motion Oral residue: Trace residue lining oral structures Location of oral residue : Tongue Initiation of swallow: Valleculae Soft palate elevation: No  bolus between soft palate (SP)/pharyngeal wall (PW) Laryngeal elevation: Partial superior movement of thyroid cartilage/partial approximation of arytenoids to epiglottic petiole Anterior hyoid excursion: Complete Epiglottic movement: Complete Laryngeal vestibule closure: Incomplete, narrow column air/contrast in laryngeal vestibule Pharyngeal stripping wave : Present - complete Pharyngoesophageal segment opening: Partial distention/partial duration, partial obstruction of flow Tongue base retraction: Wide column of contrast or air between tongue base and PPW Pharyngeal residue: Collection of residue within or on pharyngeal structures Location of pharyngeal residue: Valleculae; Tongue base; Diffuse (>3 areas); Pharyngeal wall Penetration/Aspiration Scale (PAS) score: 1.  Material does not enter airway Solid: Solid Solid: Impaired Solid - Impairment: Oral Impairment; Pharyngeal impairment Lip Closure: No labial escape Bolus preparation/mastication: Disorganized chewing/mashing with solid pieces of bolus unchewed Bolus transport/lingual motion: Slow tongue motion Oral residue: Residue collection on oral structures Location of oral residue : Tongue Initiation of swallow: Valleculae Soft palate elevation: No bolus between soft palate (SP)/pharyngeal wall (PW) Laryngeal elevation: Complete superior movement of thyroid cartilage  with complete approximation of arytenoids to epiglottic petiole Anterior hyoid excursion: Complete Epiglottic movement: Partial Laryngeal vestibule closure: Complete, no air/contrast in laryngeal vestibule Pharyngeal stripping wave : Present - diminished Pharyngoesophageal segment opening: Partial distention/partial duration, partial obstruction of flow (pt with impaired timing of closure suspected with retention below PES) Tongue base retraction: Wide column of contrast or air between tongue base and PPW Pharyngeal residue: Collection of residue within or on pharyngeal structures Location of  pharyngeal residue: Tongue base; Valleculae; Pharyngeal wall Penetration/Aspiration Scale (PAS) score: 1.  Material does not enter airway Pill: Pill Pill: Not Tested Compensatory Strategies: Compensatory Strategies Compensatory strategies: Yes Chin tuck: Effective Effective Chin Tuck: Puree Left head turn: Ineffective Right head turn: Ineffective   General Information: No data recorded Diet Prior to this Study: Regular; Thin liquids (Level 0)   Temperature : Normal   Respiratory Status: WFL   Supplemental O2: Nasal cannula   History of Recent Intubation: No  Behavior/Cognition: Alert; Cooperative; Pleasant mood Self-Feeding Abilities: Able to self-feed Baseline vocal quality/speech: Dysphonic No data recorded Volitional Swallow: Able to elicit Exam Limitations: No limitations Goal Planning: Prognosis for improved oropharyngeal function: Fair Barriers to Reach Goals: Time post onset No data recorded Patient/Family Stated Goal: pt admits to dysphagia with solids - Consulted and agree with results and recommendations: Patient Pain: Pain Assessment Pain Assessment: No/denies pain Pain Score: 1 Breathing: 0 Negative Vocalization: 0 Facial Expression: 0 Body Language: 0 Consolability: 0 PAINAD Score: 0 Pain Location: discomfort on IV site on back of L hand Pain Descriptors / Indicators: Discomfort End of Session: Start Time:SLP Start Time (ACUTE ONLY): 1601 Stop Time: SLP Stop Time (ACUTE ONLY): 1630 Time Calculation:SLP Time Calculation (min) (ACUTE ONLY): 29 min Charges: SLP Evaluations $ SLP Speech Visit: 1 Visit SLP Evaluations $BSS Swallow: 1 Procedure SLP visit diagnosis: SLP Visit Diagnosis: Dysphagia, oropharyngeal phase (R13.12) Past Medical History: Past Medical History: Diagnosis Date  Hypertension   Stroke ([place])   Thyroid disease  Past Surgical History: Past Surgical History: Procedure Laterality Date  ABDOMINAL HYSTERECTOMY    bowel obstruction    BREAST EXCISIONAL BIOPSY Right 20+ yrs ago  benign   INTRAMEDULLARY (IM) NAIL INTERTROCHANTERIC Left 10/11/2020  Procedure: INTRAMEDULLARY (IM) NAIL INTERTROCHANTRIC;  Surgeon: Shona Needles, MD;  Location: [facility];  Service: Orthopedics;  Laterality: Left;  SHOULDER SURGERY   Macario Golds 07/19/2022, 9:54 AM Kathleen Lime, MS [facility] SLP Acute Rehab Services Office [phone]  DG ESOPHAGUS W SINGLE CM (SOL OR THIN BA)  Result Date: 07/17/2022 CLINICAL DATA:  History of tobacco abuse, COPD and lymph node cancer (2010) admitted for shortness of breath. Patient complains of globus sensation and intermittent solid-food dysphagia with occasional regurgitation of food. Request received for fluoroscopic esophagram study. EXAM: ESOPHAGUS/BARIUM SWALLOW/TABLET STUDY TECHNIQUE: Single contrast examination was performed using thin liquid barium. This exam was performed by Narda Rutherford, NP, and was supervised and interpreted by Suzy Bouchard, MD. FLUOROSCOPY: Radiation Exposure Index (as provided by the fluoroscopic device): 10.20 mGy Kerma COMPARISON:  CT angio chest dated 07/16/2022 FINDINGS: Swallowing: Appears normal. No vestibular penetration or aspiration seen. Pharynx: Unremarkable. Esophagus: Normal appearance.  No mass or stricture. Esophageal motility: Mild delayed initiation of primary stripping wave. to and fro motion of barium in the esophagus. Hiatal Hernia: None. Gastroesophageal reflux: Gastroesophageal reflux not visualized with provocation maneuvers. Ingested 49m barium tablet: 13 mm barium tablet passed normally. Other: None. IMPRESSION: Mild mild dysmotility. Otherwise normal fluoroscopic esophagram study Read by: SNarda Rutherford AGNP-BC Electronically  Signed   By: Suzy Bouchard M.D.   On: 07/17/2022 11:39   CT Angio Chest Pulmonary Embolism (PE) W or WO Contrast  Result Date: 07/16/2022 CLINICAL DATA:  Difficulty breathing EXAM: CT ANGIOGRAPHY CHEST WITH CONTRAST TECHNIQUE: Multidetector CT imaging of the chest was performed using the standard protocol  during bolus administration of intravenous contrast. Multiplanar CT image reconstructions and MIPs were obtained to evaluate the vascular anatomy. RADIATION DOSE REDUCTION: This exam was performed according to the departmental dose-optimization program which includes automated exposure control, adjustment of the mA and/or kV according to patient size and/or use of iterative reconstruction technique. CONTRAST:  29m OMNIPAQUE IOHEXOL 350 MG/ML SOLN COMPARISON:  CT from the previous day. FINDINGS: Cardiovascular: Atherosclerotic calcifications of the thoracic aorta are noted. No dilatation or dissection is seen. Pulmonary artery is well visualized within normal branching pattern. No intraluminal filling defect to suggest pulmonary embolism is noted. No significant coronary calcifications are noted. No cardiac enlargement is seen. Mediastinum/Nodes: Thoracic inlet is within normal limits. The esophagus is within normal limits. Linear density is again noted adjacent to the bronchus intermedius and takeoff of the right middle lobe bronchus. These changes may be postsurgical in nature. Attenuation of the right middle lobe bronchus is again seen with right middle lobe collapse. Lungs/Pleura: Right middle lobe collapse is noted similar to that seen on the prior exam. Attenuation of the middle lobe bronchus centrally is noted. Mild bibasilar atelectasis is noted. No focal parenchymal nodule is seen. Mild emphysematous changes are noted. Upper Abdomen: Visualized upper abdomen shows no acute abnormality. Musculoskeletal: No acute rib abnormality is seen. Degenerative changes of the thoracic spine are noted. Review of the MIP images confirms the above findings. IMPRESSION: Persistent collapse in the right middle lobe with attenuation of the right middle lobe bronchus. Linear density is noted adjacent to this and these changes may be postoperative in nature. Correlate with any surgical history. Persistent bibasilar atelectasis.  No evidence of pulmonary emboli. Aortic Atherosclerosis (ICD10-I70.0) and Emphysema (ICD10-J43.9). Electronically Signed   By: MInez CatalinaM.D.   On: 07/16/2022 23:25   CT CHEST WO CONTRAST  Result Date: 07/15/2022 CLINICAL DATA:  Hypoxia. EXAM: CT CHEST WITHOUT CONTRAST TECHNIQUE: Multidetector CT imaging of the chest was performed following the standard protocol without IV contrast. RADIATION DOSE REDUCTION: This exam was performed according to the departmental dose-optimization program which includes automated exposure control, adjustment of the mA and/or kV according to patient size and/or use of iterative reconstruction technique. COMPARISON:  Chest radiograph dated 07/15/2022. Chest CT dated 04/08/2004. FINDINGS: Evaluation of this exam is limited in the absence of intravenous contrast. Cardiovascular: There is no cardiomegaly or pericardial effusion. Mild atherosclerotic calcification of the thoracic aorta. No aneurysmal dilatation. The central pulmonary arteries are grossly unremarkable. Mediastinum/Nodes: No hilar or mediastinal adenopathy. The esophagus is grossly unremarkable. No mediastinal fluid collection. Lungs/Pleura: Background of emphysema. There is volume loss in the right middle lobe with diffuse ground-glass density which may represent atelectasis. Infiltrate is less likely. There is narrowing of the right middle lobe bronchus. Linear high attenuating area along the bronchus intermedius adjacent to the takeoff of the right middle lobe bronchus may represent calcification or postsurgical changes. Correlation with surgical history recommended. No obvious mass identified in the right hilar region on this noncontrast CT. CT with IV contrast may provide better evaluation if clinically indicated. There are bibasilar linear atelectasis/scarring. No consolidative changes. There is no pleural effusion or pneumothorax. The central airways are patent. Upper Abdomen:  [place]. Musculoskeletal:  Osteopenia with degenerative changes of the spine. No acute osseous pathology. IMPRESSION: 1. Narrowed appearance of the right middle lobe bronchus with probable atelectatic changes of the right middle lobe. Correlation with history of prior surgery recommended. CT with IV contrast may provide better evaluation of the right hilum if clinically indicated. 2. Cholelithiasis. 3. Aortic Atherosclerosis (ICD10-I70.0) and Emphysema (ICD10-J43.9). Electronically Signed   By: Anner Crete M.D.   On: 07/15/2022 23:06   DG Chest Portable 1 View  Result Date: 07/15/2022 CLINICAL DATA:  Shortness of breath EXAM: PORTABLE CHEST 1 VIEW COMPARISON:  Chest x-ray 09/13/2021 FINDINGS: Hyperinflation. No consolidation, pneumothorax. There is blunting of the right costophrenic angle. No edema. Basilar atelectasis. Normal cardiopericardial silhouette with calcified aorta. No edema. Overlapping cardiac leads. IMPRESSION: Hyperinflation. Presumed basilar atelectasis or scar. Blunting of the right costophrenic angle. Tiny effusion versus thickening Electronically Signed   By: Jill Side M.D.   On: 07/15/2022 16:19    Microbiology: Results for orders placed or performed during the hospital encounter of 07/15/22  Resp panel by RT-PCR (RSV, Flu A&B, Covid) Anterior Nasal Swab     Status: None   Collection Time: 07/15/22  4:27 PM   Specimen: Anterior Nasal Swab  Result Value Ref Range Status   SARS Coronavirus 2 by RT PCR NEGATIVE NEGATIVE Final    Comment: (NOTE) SARS-CoV-2 target nucleic acids are NOT DETECTED.  The SARS-CoV-2 RNA is generally detectable in upper respiratory specimens during the acute phase of infection. The lowest concentration of SARS-CoV-2 viral copies this assay can detect is 138 copies/mL. A negative result does not preclude SARS-Cov-2 infection and should not be used as the sole basis for treatment or other patient management decisions. A negative result may occur with  improper specimen  collection/handling, submission of specimen other than nasopharyngeal swab, presence of viral mutation(s) within the areas targeted by this assay, and inadequate number of viral copies(<138 copies/mL). A negative result must be combined with clinical observations, patient history, and epidemiological information. The expected result is Negative.  Fact Sheet for Patients:  EntrepreneurPulse.com.au  Fact Sheet for Healthcare Providers:  IncredibleEmployment.be  This test is no t yet approved or cleared by the [place] FDA and  has been authorized for detection and/or diagnosis of SARS-CoV-2 by FDA under an Emergency Use Authorization (EUA). This EUA will remain  in effect (meaning this test can be used) for the duration of the COVID-19 declaration under Section 564(b)(1) of the Act, 21 U.S.C.section 360bbb-3(b)(1), unless the authorization is terminated  or revoked sooner.       Influenza A by PCR NEGATIVE NEGATIVE Final   Influenza B by PCR NEGATIVE NEGATIVE Final    Comment: (NOTE) The Xpert Xpress SARS-CoV-2/FLU/RSV plus assay is intended as an aid in the diagnosis of influenza from Nasopharyngeal swab specimens and should not be used as a sole basis for treatment. Nasal washings and aspirates are unacceptable for Xpert Xpress SARS-CoV-2/FLU/RSV testing.  Fact Sheet for Patients: EntrepreneurPulse.com.au  Fact Sheet for Healthcare Providers: IncredibleEmployment.be  This test is not yet approved or cleared by the [place] FDA and has been authorized for detection and/or diagnosis of SARS-CoV-2 by FDA under an Emergency Use Authorization (EUA). This EUA will remain in effect (meaning this test can be used) for the duration of the COVID-19 declaration under Section 564(b)(1) of the Act, 21 U.S.C. section 360bbb-3(b)(1), unless the authorization is terminated or revoked.     Resp Syncytial  Virus by PCR NEGATIVE NEGATIVE  Final    Comment: (NOTE) Fact Sheet for Patients: EntrepreneurPulse.com.au  Fact Sheet for Healthcare Providers: IncredibleEmployment.be  This test is not yet approved or cleared by the [place] FDA and has been authorized for detection and/or diagnosis of SARS-CoV-2 by FDA under an Emergency Use Authorization (EUA). This EUA will remain in effect (meaning this test can be used) for the duration of the COVID-19 declaration under Section 564(b)(1) of the Act, 21 U.S.C. section 360bbb-3(b)(1), unless the authorization is terminated or revoked.  Performed at [facility], [place] [address] [place], [place] [address]   Expectorated Sputum Assessment w Gram Stain, Rflx to Resp Cult     Status: None   Collection Time: 07/17/22  7:07 PM   Specimen: Expectorated Sputum  Result Value Ref Range Status   Specimen Description EXPECTORATED SPUTUM  Final   Special Requests NONE  Final   Sputum evaluation   Final    THIS SPECIMEN IS ACCEPTABLE FOR SPUTUM CULTURE Performed at [facility], [place] [address] [place], [place] [address]    Report Status 07/17/2022 FINAL  Final  Culture, Respiratory w Gram Stain     Status: None   Collection Time: 07/17/22  7:07 PM  Result Value Ref Range Status   Specimen Description   Final    EXPECTORATED SPUTUM Performed at [facility] [address], [place], [place] [address]    Special Requests   Final    NONE Reflexed from [id number] Performed at [facility] [address], [place], [place] [address]    Gram Stain   Final    RARE SQUAMOUS EPITHELIAL CELLS PRESENT RARE WBC PRESENT, PREDOMINANTLY MONONUCLEAR FEW YEAST WITH PSEUDOHYPHAE FEW GRAM POSITIVE RODS FEW GRAM POSITIVE COCCI IN PAIRS IN SINGLES Performed at [facility], [place] [address] [place], [place] [address]    Culture   Final    FEW  PSEUDOMONAS AERUGINOSA FEW KLEBSIELLA PNEUMONIAE    Report Status 07/21/2022 FINAL  Final   Organism ID, Bacteria PSEUDOMONAS AERUGINOSA  Final   Organism ID, Bacteria KLEBSIELLA PNEUMONIAE  Final      Susceptibility   Klebsiella pneumoniae - MIC*    AMPICILLIN >=32 RESISTANT Resistant     CEFEPIME <=0.12 SENSITIVE Sensitive     CEFTAZIDIME <=1 SENSITIVE Sensitive     CEFTRIAXONE <=0.25 SENSITIVE Sensitive     CIPROFLOXACIN <=0.25 SENSITIVE Sensitive     GENTAMICIN <=1 SENSITIVE Sensitive     IMIPENEM <=0.25 SENSITIVE Sensitive     TRIMETH/SULFA <=20 SENSITIVE Sensitive     AMPICILLIN/SULBACTAM 8 SENSITIVE Sensitive     PIP/TAZO 16 SENSITIVE Sensitive     * FEW KLEBSIELLA PNEUMONIAE   Pseudomonas aeruginosa - MIC*    CEFTAZIDIME 4 SENSITIVE Sensitive     CIPROFLOXACIN <=0.25 SENSITIVE Sensitive     GENTAMICIN 2 SENSITIVE Sensitive     IMIPENEM 1 SENSITIVE Sensitive     PIP/TAZO <=4 SENSITIVE Sensitive     CEFEPIME 2 SENSITIVE Sensitive     * FEW PSEUDOMONAS AERUGINOSA    Labs: CBC: Recent Labs  Lab 07/15/22 1627 07/17/22 0402 07/18/22 0701 07/19/22 0419 07/21/22 0703  WBC 5.8 12.1* 10.9* 9.9 8.9  NEUTROABS 3.4 10.0*  --   --   --   HGB 17.0* 15.8* 16.2* 15.6* 16.9*  HCT 53.2* 49.0* 50.6* 48.5* 53.5*  MCV 99.1 97.0 97.9 97.0 97.4  PLT 189 181 150 151 [id number]   Basic Metabolic Panel: Recent Labs  Lab  07/15/22 1627 07/17/22 0402 07/18/22 0701 07/19/22 0419 07/21/22 0703  NA 138 138 138 137 136  K 4.3 4.1 3.8 3.8 3.7  CL 103 100 100 101 94*  CO2 '27 29 31 29 '$ 34*  GLUCOSE 90 106* 93 108* 42*  BUN 19 25* 19 18 26*  CREATININE 0.75 0.78 0.67 0.61 0.77  CALCIUM 9.0 9.0 8.8* 8.6* 9.6  MG  --  1.9 1.9  --   --   PHOS  --  3.2 3.0  --   --    Liver Function Tests: Recent Labs  Lab 07/17/22 0402  AST 24  ALT 14  ALKPHOS 45  BILITOT 0.4  PROT 5.7*  ALBUMIN 3.2*   CBG: Recent Labs  Lab 07/21/22 0744  GLUCAP 84    Discharge time spent: greater than 30  minutes.  Signed: Estill Cotta, MD [facility] 07/21/2022

## 2022-07-21 NOTE — Plan of Care (Signed)

## 2022-07-26 ENCOUNTER — Other Ambulatory Visit (HOSPITAL_BASED_OUTPATIENT_CLINIC_OR_DEPARTMENT_OTHER): Payer: Self-pay | Admitting: Family Medicine

## 2022-07-26 DIAGNOSIS — M7989 Other specified soft tissue disorders: Secondary | ICD-10-CM

## 2022-07-27 ENCOUNTER — Ambulatory Visit (HOSPITAL_BASED_OUTPATIENT_CLINIC_OR_DEPARTMENT_OTHER)
Admission: RE | Admit: 2022-07-27 | Discharge: 2022-07-27 | Disposition: A | Discharge status: Home / Self Care | Payer: Medicare Other | Source: Ambulatory Visit | Attending: Family Medicine | Admitting: Family Medicine

## 2022-07-27 DIAGNOSIS — M7989 Other specified soft tissue disorders: Secondary | ICD-10-CM | POA: Diagnosis present

## 2022-11-02 ENCOUNTER — Encounter (HOSPITAL_BASED_OUTPATIENT_CLINIC_OR_DEPARTMENT_OTHER): Payer: Self-pay | Admitting: Pulmonary Disease

## 2022-11-02 ENCOUNTER — Ambulatory Visit (INDEPENDENT_AMBULATORY_CARE_PROVIDER_SITE_OTHER): Payer: Medicare Other | Admitting: Pulmonary Disease

## 2022-11-02 ENCOUNTER — Ambulatory Visit (HOSPITAL_BASED_OUTPATIENT_CLINIC_OR_DEPARTMENT_OTHER): Payer: Medicare Other

## 2022-11-02 VITALS — BP 118/64 | HR 67 | Ht 66.0 in | Wt 106.0 lb

## 2022-11-02 DIAGNOSIS — J9811 Atelectasis: Secondary | ICD-10-CM

## 2022-11-02 DIAGNOSIS — J439 Emphysema, unspecified: Secondary | ICD-10-CM

## 2022-11-02 DIAGNOSIS — J4489 Other specified chronic obstructive pulmonary disease: Secondary | ICD-10-CM

## 2022-11-02 DIAGNOSIS — Z716 Tobacco abuse counseling: Secondary | ICD-10-CM | POA: Diagnosis not present

## 2022-11-02 DIAGNOSIS — J3089 Other allergic rhinitis: Secondary | ICD-10-CM

## 2022-11-02 MED ORDER — AZELASTINE HCL 0.1 % NA SOLN: 1.0000 | Freq: Every day | NASAL | 12 refills | Status: AC | PRN

## 2022-11-02 MED ORDER — TRELEGY ELLIPTA 100-62.5-25 MCG/ACT IN AEPB: 1.0000 | INHALATION_SPRAY | Freq: Every day | RESPIRATORY_TRACT | 5 refills | Status: DC

## 2022-11-02 MED ORDER — TRELEGY ELLIPTA 100-62.5-25 MCG/ACT IN AEPB: 1.0000 | INHALATION_SPRAY | Freq: Every day | RESPIRATORY_TRACT | 0 refills | Status: AC

## 2022-11-02 NOTE — Addendum Note (Signed)
Addended by: Maurene Capes on: 11/02/2022 11:21 AM   Modules accepted: Orders

## 2022-11-02 NOTE — Patient Instructions (Addendum)
Chest xray today  Trelegy one puff daily, and rinse your mouth after each use  Albuterol two puffs every 6 hours as needed for cough, wheeze, chest congestion or shortness of breath  Azelastine 1 spray in each nostril daily as needed for allergies and sinus congestion  Will schedule pulmonary function test  Follow up in 4 to 6 weeks

## 2022-11-02 NOTE — Progress Notes (Signed)
Pulmonary, Critical Care, and Sleep Medicine  Chief Complaint  Patient presents with   Consult    Referred for COPD. States she was diagnosed about a year ago. Increased productive cough with yellowish phlegm.     Past Surgical History:  She  has a past surgical history that includes Breast excisional biopsy (Right, 20+ yrs ago); Abdominal hysterectomy; bowel obstruction; Shoulder surgery; and Intramedullary (im) nail intertrochanteric (Left, 10/11/2020).  Past Medical History:  HTN, CVA, Osteoporosis, HLD, Depression, Hypothyroidism  Constitutional:  BP 118/64   Pulse 67   Ht 5\' 6"  (1.676 m)   Wt 106 lb (48.1 kg)   SpO2 95% Comment: on RA  BMI 17.11 kg/m   Brief Summary:  Melody White is a 71 y.o. female smoker with emphysema.      Subjective:   She started smoking at age 65.  Smokes 1 pack per day.  She was told a year ago she has COPD.  She doesn't recall every doing a breathing test. She was in the hospital for COPD exacerbation and hypoxia.  CT chest from then showed Rt middle lobe collapse.  She was sent home with oxygen, but doesn't use it.  She checks her oxygen on her smart watch when sitting and always stays in the 90's.    She has cough with clear sputum.  Sometimes gets wheezing.  Has sinus congestion and more drainage when she lays flat.  Has soreness in her ribs when she coughs a lot.  Denies hemoptysis.  She had pneumonia several years ago.  No history of tuberculosis.  Her mother had breathing problems and also used a CPAP machine.  She got an inhaler from the hospital [facility] but is it was too expensive.  She uses albuterol several times per week and this helps.  She doesn't feel like she has any trouble with her breathing while asleep.  She maintained her SpO2 > 90% while walking on room air in the office today.  Physical Exam:   Appearance - well kempt, thin  ENMT - no sinus tenderness, no oral exudate, no LAN, Mallampati 3 airway, no  stridor, raspy voice  Respiratory - bilateral rhonchi that clear with coughing  CV - s1s2 regular rate and rhythm, no murmurs  Ext - 1+ edema ankle edema  Skin - venous stasis changes  Psych - normal mood and affect   Pulmonary testing:    Chest Imaging:  CT angio chest 07/16/22 >> RML collapse, mild emphysema  Cardiac Tests:  Echo 07/21/22 >> EF 55 to 60%, grade 1 DD  Social History:  She  reports that she has been smoking cigarettes. She has a 25.00 pack-year smoking history. She has never used smokeless tobacco. She reports that she does not currently use alcohol. She reports that she does not currently use drugs.  Family History:  Her family history includes Arthritis in her mother.     Assessment/Plan:   Centrilobular emphysema and chronic mucopurulent bronchitis with presumed COPD. - will arrange for pulmonary function test - will have her try trelegy 100 one puff daily; sample provided and inhaler technique demonstrated - prn albuterol  Right middle lobe atelectasis. - noted on CT chest from March 2024 - will arrange for chest xray today - if this persists, then she will need repeat CT chest and possibly bronchoscopy  Tobacco abuse. - spent 8 minutes discussing smoking cessation options - she is willing to try chantix at some point - she is the primary  caregiver for her husband who is blind and her brother who just got out of the hospital - she would like to wait until her brother can move back to his home before trying chantix  Perennial allergic rhinitis. - will have her try azelastine daily prn  Time Spent Involved in Patient Care on Day of Examination:  52 minutes  Follow up:   Patient Instructions  Chest xray today  Trelegy one puff daily, and rinse your mouth after each use  Albuterol two puffs every 6 hours as needed for cough, wheeze, chest congestion or shortness of breath  Azelastine 1 spray in each nostril daily as needed for allergies and  sinus congestion  Will schedule pulmonary function test  Follow up in 4 to 6 weeks  Medication List:   Allergies as of 11/02/2022       Reactions   Amoxicillin Other (See Comments)   other   Azithromycin Itching   Clindamycin Other (See Comments)   Funny feeling other   Codeine Itching, Other (See Comments)   other   Fexofenadine Other (See Comments)   Funny feeling other   Hydrocodone Bit-homatrop Mbr Itching   Sulfamethoxazole Nausea Only, Other (See Comments)   other        Medication List        Accurate as of November 02, 2022 10:48 AM. If you have any questions, ask your nurse or doctor.          STOP taking these medications    acetaminophen 325 MG tablet Commonly known as: TYLENOL Stopped by: Coralyn Helling, MD   albuterol 108 (90 Base) MCG/ACT inhaler Commonly known as: VENTOLIN HFA Stopped by: Coralyn Helling, MD   budesonide-formoterol 160-4.5 MCG/ACT inhaler Commonly known as: SYMBICORT Stopped by: Coralyn Helling, MD   Compressor/Nebulizer Misc Stopped by: Coralyn Helling, MD   Elwin Sleight 200-5 MCG/ACT Aero Generic drug: mometasone-formoterol Stopped by: Coralyn Helling, MD   furosemide 20 MG tablet Commonly known as: Lasix Stopped by: Coralyn Helling, MD   ipratropium-albuterol 0.5-2.5 (3) MG/3ML Soln Commonly known as: DUONEB Stopped by: Coralyn Helling, MD   montelukast 10 MG tablet Commonly known as: SINGULAIR Stopped by: Coralyn Helling, MD   ondansetron 4 MG tablet Commonly known as: Zofran Stopped by: Coralyn Helling, MD       TAKE these medications    alendronate 70 MG tablet Commonly known as: FOSAMAX Take 70 mg by mouth once a week.   aspirin EC 81 MG tablet Take 1 tablet (81 mg total) by mouth daily. Swallow whole.   atorvastatin 20 MG tablet Commonly known as: LIPITOR Take 1 tablet (20 mg total) by mouth daily.   azelastine 0.1 % nasal spray Commonly known as: ASTELIN Place 1 spray into both nostrils daily as needed for rhinitis or allergies.  Use in each nostril as directed Started by: Coralyn Helling, MD   calcium carbonate 1500 (600 Ca) MG Tabs tablet Commonly known as: Calcium 600 Take 1 tablet (1,500 mg total) by mouth 2 (two) times daily with a meal.   Cholecalciferol 25 MCG (1000 UT) tablet Take 1 tablet (1,000 Units total) by mouth daily.   diazepam 10 MG tablet Commonly known as: VALIUM Take 10 mg by mouth at bedtime as needed for anxiety or sleep. Into the vagina every night as needed for pelvic floor spasms   escitalopram 20 MG tablet Commonly known as: LEXAPRO Take 1 tablet (20 mg total) by mouth daily.   levothyroxine 75 MCG tablet Commonly known as:  SYNTHROID Take 1 tablet (75 mcg total) by mouth daily.   multivitamin capsule Take 1 capsule by mouth daily.   Trelegy Ellipta 100-62.5-25 MCG/ACT Aepb Generic drug: Fluticasone-Umeclidin-Vilant Inhale 1 puff into the lungs daily. Started by: Coralyn Helling, MD   VITAMIN B COMPLEX PO Take 1 tablet by mouth daily.        Signature:  Coralyn Helling, MD [facility] Pulmonary/Critical Care Pager - [phone] 11/02/2022, 10:48 AM

## 2022-11-03 ENCOUNTER — Telehealth: Payer: Self-pay | Admitting: Pulmonary Disease

## 2022-11-03 NOTE — Telephone Encounter (Signed)
Patient states Trelegy too expensive at pharmacy. Would like to know if she can get patient assistance thru [employer]. Patient phone number is [phone].

## 2022-11-15 ENCOUNTER — Telehealth: Payer: Self-pay | Admitting: Pulmonary Disease

## 2022-11-15 NOTE — Telephone Encounter (Signed)
PT states the pills she was prescribed are too much $. I went over her meds and see no pills. She then told me she is not sure but that it was for her "nose over night" Please call to see if Dr. Kristine Linea and alternative. He # is [phone]

## 2022-11-22 NOTE — Telephone Encounter (Signed)
71 days old, no reply. Please contact pt. TY.

## 2022-11-22 NOTE — Telephone Encounter (Signed)
Called patient but she did not answer. Left message for her to call us back.  

## 2022-11-23 ENCOUNTER — Encounter: Payer: Self-pay | Admitting: Emergency Medicine

## 2022-11-23 NOTE — Telephone Encounter (Signed)
VS, please advise.  

## 2022-11-24 NOTE — Telephone Encounter (Signed)
At her visit on 11/02/22 I had started her on trelegy, and azelastine.    If trelegy is too expensive, then please ask the pharmacy team to see if there is a more affordable option on her insurance plan.  If azelastine is too expensive, then have her try flonase 1 spray in each nostril nightly.

## 2022-11-24 NOTE — Telephone Encounter (Signed)
Spoke with patient.  Patient prefers we print out AZ&[place] forms and give her the patient part to take home and fill out.  She will bring this back once completed.    Patient will pick up forms next week, around 11/28/2022.  Patient states nothing further needed at this time.

## 2022-11-27 NOTE — Telephone Encounter (Signed)
 Called the pt and there was no answer- LMTCB    

## 2022-12-04 NOTE — Telephone Encounter (Signed)
Please try again so we can close encounter.

## 2022-12-14 ENCOUNTER — Other Ambulatory Visit: Payer: Self-pay | Admitting: Otolaryngology

## 2022-12-14 DIAGNOSIS — K219 Gastro-esophageal reflux disease without esophagitis: Secondary | ICD-10-CM

## 2022-12-14 DIAGNOSIS — F17200 Nicotine dependence, unspecified, uncomplicated: Secondary | ICD-10-CM

## 2022-12-14 DIAGNOSIS — Z720 Tobacco use: Secondary | ICD-10-CM

## 2022-12-19 ENCOUNTER — Ambulatory Visit (HOSPITAL_BASED_OUTPATIENT_CLINIC_OR_DEPARTMENT_OTHER): Payer: Medicare Other | Admitting: Pulmonary Disease

## 2022-12-21 ENCOUNTER — Other Ambulatory Visit: Payer: Medicare Other

## 2023-01-02 ENCOUNTER — Ambulatory Visit
Admission: RE | Admit: 2023-01-02 | Discharge: 2023-01-02 | Disposition: A | Discharge status: Home / Self Care | Payer: Medicare Other | Source: Ambulatory Visit | Attending: Otolaryngology | Admitting: Otolaryngology

## 2023-01-02 DIAGNOSIS — Z720 Tobacco use: Secondary | ICD-10-CM

## 2023-01-02 DIAGNOSIS — K219 Gastro-esophageal reflux disease without esophagitis: Secondary | ICD-10-CM

## 2023-01-02 DIAGNOSIS — F17200 Nicotine dependence, unspecified, uncomplicated: Secondary | ICD-10-CM

## 2023-02-02 ENCOUNTER — Encounter (HOSPITAL_BASED_OUTPATIENT_CLINIC_OR_DEPARTMENT_OTHER): Payer: Self-pay | Admitting: Pulmonary Disease

## 2023-02-02 ENCOUNTER — Ambulatory Visit (HOSPITAL_BASED_OUTPATIENT_CLINIC_OR_DEPARTMENT_OTHER): Payer: Medicare Other | Admitting: Pulmonary Disease

## 2023-02-02 VITALS — BP 138/72 | HR 60 | Resp 16 | Ht 66.0 in | Wt 103.0 lb

## 2023-02-02 DIAGNOSIS — J439 Emphysema, unspecified: Secondary | ICD-10-CM

## 2023-02-02 DIAGNOSIS — J4489 Other specified chronic obstructive pulmonary disease: Secondary | ICD-10-CM | POA: Diagnosis not present

## 2023-02-02 DIAGNOSIS — B370 Candidal stomatitis: Secondary | ICD-10-CM

## 2023-02-02 DIAGNOSIS — Z716 Tobacco abuse counseling: Secondary | ICD-10-CM

## 2023-02-02 LAB — PULMONARY FUNCTION TEST
DL/VA % pred: 58 %
DL/VA: 2.39 ml/min/mmHg/L
DLCO cor % pred: 37 %
DLCO cor: 7.46 ml/min/mmHg
DLCO unc % pred: 37 %
DLCO unc: 7.46 ml/min/mmHg
FEF 25-75 Post: 0.74 L/sec
FEF 25-75 Pre: 0.31 L/sec
FEF2575-%Change-Post: 136 %
FEF2575-%Pred-Post: 37 %
FEF2575-%Pred-Pre: 16 %
FEV1-%Change-Post: 25 %
FEV1-%Pred-Post: 40 %
FEV1-%Pred-Pre: 32 %
FEV1-Post: 0.97 L
FEV1-Pre: 0.77 L
FEV1FVC-%Change-Post: 0 %
FEV1FVC-%Pred-Pre: 76 %
FEV6-%Change-Post: 30 %
FEV6-%Pred-Post: 54 %
FEV6-%Pred-Pre: 41 %
FEV6-Post: 1.64 L
FEV6-Pre: 1.25 L
FEV6FVC-%Change-Post: 4 %
FEV6FVC-%Pred-Post: 103 %
FEV6FVC-%Pred-Pre: 99 %
FVC-%Change-Post: 25 %
FVC-%Pred-Post: 52 %
FVC-%Pred-Pre: 42 %
FVC-Post: 1.65 L
FVC-Pre: 1.31 L
Post FEV1/FVC ratio: 59 %
Post FEV6/FVC ratio: 99 %
Pre FEV1/FVC ratio: 58 %
Pre FEV6/FVC Ratio: 95 %
RV % pred: 179 %
RV: 4.02 L
TLC % pred: 108 %
TLC: 5.63 L

## 2023-02-02 MED ORDER — TRELEGY ELLIPTA 100-62.5-25 MCG/ACT IN AEPB: 1.0000 | INHALATION_SPRAY | Freq: Every day | RESPIRATORY_TRACT | Status: AC

## 2023-02-02 MED ORDER — NYSTATIN 100000 UNIT/ML MT SUSP: 500000.0000 [IU] | Freq: Four times a day (QID) | OROMUCOSAL | 0 refills | Status: AC

## 2023-02-02 NOTE — Patient Instructions (Addendum)
Full PFT Performed Today  

## 2023-02-02 NOTE — Addendum Note (Signed)
Addended by: Jama Flavors on: 02/02/2023 04:34 PM   Modules accepted: Orders

## 2023-02-02 NOTE — Progress Notes (Signed)
[facility]  Chief Complaint  Patient presents with   Follow-up    Past Surgical History:  She  has a past surgical history that includes Breast excisional biopsy (Right, 20+ yrs ago); Abdominal hysterectomy; bowel obstruction; Shoulder surgery; and Intramedullary (im) nail intertrochanteric (Left, 10/11/2020).  Past Medical History:  HTN, CVA, Osteoporosis, HLD, Depression, Hypothyroidism  Constitutional:  BP 138/72   Pulse 60   Resp 16   Ht 5\' 6"  (1.676 m)   Wt 103 lb (46.7 kg)   SpO2 90%   BMI 16.62 kg/m   Brief Summary:  Melody White is a 72 y.o. female smoker with emphysema.      Subjective:   Chest xray from 11/02/22 showed improved aeration of the Rt middle lobe area.  She is still smoking cigarettes.  Was treated for an exacerbation recently with steroids and antibiotics.  Had to use dulera because she ran out of trelegy, but trelegy worked better.  Feeling better now.  Uses oxygen at night while asleep.  PFT today shows severe obstruction, positive bronchodilator response, air trapping, and severe diffusion defect.  Physical Exam:   Appearance - well kempt, thin  ENMT - no sinus tenderness, no oral exudate, no LAN, Mallampati 3 airway, no stridor, raspy voice, changes of thrush, wears dentures  Respiratory - bilateral rhonchi that clear with coughing  CV - s1s2 regular rate and rhythm, no murmurs  Ext - 1+ edema ankle edema  Skin - venous stasis changes  Psych - normal mood and affect   Pulmonary testing:  PFT 02/02/23 >> FEV1 0.97 (40%), FEV1% 59, TLC 5.63 (108%), RV 4.02 (179%), DLCO 37%, +BD  Chest Imaging:  CT angio chest 07/16/22 >> RML collapse, mild emphysema  Cardiac Tests:  Echo 07/21/22 >> EF 55 to 60%, grade 1 DD  Social History:  She  reports that she has been smoking cigarettes. She has a 25 pack-year smoking history. She has never used smokeless tobacco. She reports that she does not  currently use alcohol. She reports that she does not currently use drugs.  Family History:  Her family history includes Arthritis in her mother.     Assessment/Plan:   Centrilobular emphysema and chronic mucopurulent bronchitis with presumed COPD. - will arrange for pulmonary function test - will have her try trelegy 100 one puff daily; sample provided and inhaler technique demonstrated - prn albuterol  Thrush. - will give her course of nystatin  Tobacco abuse. - spent 8 minutes discussing smoking cessation options - she is willing to try chantix at some point - she is the primary caregiver for her husband who is blind and her brother who just got out of the hospital - she would like to wait until her brother can move back to his home before trying chantix  Perennial allergic rhinitis. - will have her try azelastine daily prn  Time Spent Involved in Patient Care on Day of Examination:  38 minutes  Follow up:   Patient Instructions  Nystatin 5 ml swish and swallow 4 times daily for 5 days  Follow up in 4 months  Medication List:   Allergies as of 02/02/2023       Reactions   Amoxicillin Other (See Comments)   other   Azithromycin Itching   Clindamycin Other (See Comments)   Funny feeling other   Codeine Itching, Other (See Comments)   other   Fexofenadine Other (See Comments)   Funny feeling other  Hydrocodone Bit-homatrop Mbr Itching   Sulfamethoxazole Nausea Only, Other (See Comments)   other        Medication List        Accurate as of February 02, 2023 10:50 AM. If you have any questions, ask your nurse or doctor.          alendronate 70 MG tablet Commonly known as: FOSAMAX Take 70 mg by mouth once a week.   aspirin EC 81 MG tablet Take 1 tablet (81 mg total) by mouth daily. Swallow whole.   atorvastatin 20 MG tablet Commonly known as: LIPITOR Take 1 tablet (20 mg total) by mouth daily.   azelastine 0.1 % nasal spray Commonly known  as: ASTELIN Place 1 spray into both nostrils daily as needed for rhinitis or allergies. Use in each nostril as directed   calcium carbonate 1500 (600 Ca) MG Tabs tablet Commonly known as: Calcium 600 Take 1 tablet (1,500 mg total) by mouth 2 (two) times daily with a meal.   Cholecalciferol 25 MCG (1000 UT) tablet Take 1 tablet (1,000 Units total) by mouth daily.   diazepam 10 MG tablet Commonly known as: VALIUM Take 10 mg by mouth at bedtime as needed for anxiety or sleep. Into the vagina every night as needed for pelvic floor spasms   escitalopram 20 MG tablet Commonly known as: LEXAPRO Take 1 tablet (20 mg total) by mouth daily.   levothyroxine 75 MCG tablet Commonly known as: SYNTHROID Take 1 tablet (75 mcg total) by mouth daily.   multivitamin capsule Take 1 capsule by mouth daily.   nystatin 100000 UNIT/ML suspension Commonly known as: MYCOSTATIN Take 5 mLs (500,000 Units total) by mouth 4 (four) times daily. Started by: Coralyn Helling   Trelegy Ellipta 100-62.5-25 MCG/ACT Aepb Generic drug: Fluticasone-Umeclidin-Vilant Inhale 1 puff into the lungs daily.   Trelegy Ellipta 100-62.5-25 MCG/ACT Aepb Generic drug: Fluticasone-Umeclidin-Vilant Inhale 1 puff into the lungs daily.   VITAMIN B COMPLEX PO Take 1 tablet by mouth daily.        Signature:  Coralyn Helling, MD [facility] Pager - [phone] 02/02/2023, 10:50 AM

## 2023-02-02 NOTE — Patient Instructions (Signed)
Nystatin 5 ml swish and swallow 4 times daily for 5 days  Follow up in 4 months

## 2023-02-02 NOTE — Progress Notes (Signed)
Full PFT Performed Today

## 2023-02-05 ENCOUNTER — Other Ambulatory Visit (HOSPITAL_BASED_OUTPATIENT_CLINIC_OR_DEPARTMENT_OTHER): Payer: Self-pay

## 2023-02-05 MED ORDER — TRELEGY ELLIPTA 100-62.5-25 MCG/ACT IN AEPB: 1.0000 | INHALATION_SPRAY | Freq: Every day | RESPIRATORY_TRACT | 1 refills | Status: AC

## 2023-07-14 DEATH — deceased

## 2023-09-04 ENCOUNTER — Other Ambulatory Visit: Payer: Self-pay | Admitting: Family Medicine

## 2023-09-04 DIAGNOSIS — M810 Age-related osteoporosis without current pathological fracture: Secondary | ICD-10-CM

## 2023-12-20 ENCOUNTER — Ambulatory Visit (HOSPITAL_BASED_OUTPATIENT_CLINIC_OR_DEPARTMENT_OTHER)
Admission: RE | Admit: 2023-12-20 | Discharge: 2023-12-20 | Disposition: A | Discharge status: Home / Self Care | Payer: PRIVATE HEALTH INSURANCE | Source: Ambulatory Visit | Attending: Family Medicine | Admitting: Family Medicine

## 2023-12-20 DIAGNOSIS — M810 Age-related osteoporosis without current pathological fracture: Secondary | ICD-10-CM | POA: Insufficient documentation

## 2024-05-23 ENCOUNTER — Other Ambulatory Visit
